# Patient Record
Sex: Female | Born: 1935 | Race: White | Hispanic: No | Marital: Married | Smoking: Former smoker
Health system: Southern US, Community
[De-identification: ages and names within clinical notes are randomized; demographics above are authoritative.]

## PROBLEM LIST (undated history)

## (undated) DIAGNOSIS — T4145XA Adverse effect of unspecified anesthetic, initial encounter: Secondary | ICD-10-CM

## (undated) DIAGNOSIS — I82409 Acute embolism and thrombosis of unspecified deep veins of unspecified lower extremity: Secondary | ICD-10-CM

## (undated) DIAGNOSIS — K5790 Diverticulosis of intestine, part unspecified, without perforation or abscess without bleeding: Secondary | ICD-10-CM

## (undated) DIAGNOSIS — I10 Essential (primary) hypertension: Secondary | ICD-10-CM

## (undated) DIAGNOSIS — C4491 Basal cell carcinoma of skin, unspecified: Secondary | ICD-10-CM

## (undated) DIAGNOSIS — Z8719 Personal history of other diseases of the digestive system: Secondary | ICD-10-CM

## (undated) DIAGNOSIS — Z9989 Dependence on other enabling machines and devices: Secondary | ICD-10-CM

## (undated) DIAGNOSIS — K219 Gastro-esophageal reflux disease without esophagitis: Secondary | ICD-10-CM

## (undated) DIAGNOSIS — M47812 Spondylosis without myelopathy or radiculopathy, cervical region: Secondary | ICD-10-CM

## (undated) DIAGNOSIS — F419 Anxiety disorder, unspecified: Secondary | ICD-10-CM

## (undated) DIAGNOSIS — G5792 Unspecified mononeuropathy of left lower limb: Secondary | ICD-10-CM

## (undated) DIAGNOSIS — IMO0002 Reserved for concepts with insufficient information to code with codable children: Secondary | ICD-10-CM

## (undated) DIAGNOSIS — G5700 Lesion of sciatic nerve, unspecified lower limb: Secondary | ICD-10-CM

## (undated) DIAGNOSIS — M5430 Sciatica, unspecified side: Secondary | ICD-10-CM

## (undated) DIAGNOSIS — G4733 Obstructive sleep apnea (adult) (pediatric): Secondary | ICD-10-CM

## (undated) DIAGNOSIS — M1990 Unspecified osteoarthritis, unspecified site: Secondary | ICD-10-CM

## (undated) DIAGNOSIS — T8859XA Other complications of anesthesia, initial encounter: Secondary | ICD-10-CM

## (undated) DIAGNOSIS — G4730 Sleep apnea, unspecified: Secondary | ICD-10-CM

## (undated) DIAGNOSIS — M810 Age-related osteoporosis without current pathological fracture: Secondary | ICD-10-CM

## (undated) DIAGNOSIS — IMO0001 Reserved for inherently not codable concepts without codable children: Secondary | ICD-10-CM

## (undated) DIAGNOSIS — M8580 Other specified disorders of bone density and structure, unspecified site: Secondary | ICD-10-CM

## (undated) HISTORY — PX: HERNIA REPAIR: SHX51

## (undated) HISTORY — PX: INTRAOPERATIVE ARTERIOGRAM: SHX5157

## (undated) HISTORY — DX: Lesion of sciatic nerve, unspecified lower limb: G57.00

## (undated) HISTORY — DX: Reserved for concepts with insufficient information to code with codable children: IMO0002

## (undated) HISTORY — DX: Reserved for inherently not codable concepts without codable children: IMO0001

## (undated) HISTORY — PX: ABDOMINAL HYSTERECTOMY: SHX81

## (undated) HISTORY — DX: Essential (primary) hypertension: I10

## (undated) HISTORY — DX: Basal cell carcinoma of skin, unspecified: C44.91

## (undated) HISTORY — PX: EYE SURGERY: SHX253

## (undated) HISTORY — PX: TUBAL LIGATION: SHX77

## (undated) HISTORY — PX: BREAST SURGERY: SHX581

## (undated) HISTORY — DX: Dependence on other enabling machines and devices: Z99.89

## (undated) HISTORY — PX: SPINAL FUSION: SHX223

## (undated) HISTORY — PX: TOTAL HIP ARTHROPLASTY: SHX124

## (undated) HISTORY — PX: OTHER SURGICAL HISTORY: SHX169

## (undated) HISTORY — DX: Other specified disorders of bone density and structure, unspecified site: M85.80

## (undated) HISTORY — DX: Gastro-esophageal reflux disease without esophagitis: K21.9

## (undated) HISTORY — DX: Diverticulosis of intestine, part unspecified, without perforation or abscess without bleeding: K57.90

## (undated) HISTORY — DX: Obstructive sleep apnea (adult) (pediatric): G47.33

## (undated) HISTORY — DX: Anxiety disorder, unspecified: F41.9

---

## 1991-11-08 HISTORY — PX: TOTAL VAGINAL HYSTERECTOMY: SHX2548

## 2000-04-12 ENCOUNTER — Other Ambulatory Visit: Admission: RE | Admit: 2000-04-12 | Discharge: 2000-04-12 | Payer: Self-pay | Admitting: Radiology

## 2000-04-12 ENCOUNTER — Encounter (INDEPENDENT_AMBULATORY_CARE_PROVIDER_SITE_OTHER): Payer: Self-pay

## 2000-11-14 ENCOUNTER — Encounter: Payer: Self-pay | Admitting: Internal Medicine

## 2001-01-25 ENCOUNTER — Ambulatory Visit (HOSPITAL_COMMUNITY): Admission: RE | Admit: 2001-01-25 | Discharge: 2001-01-25 | Payer: Self-pay | Admitting: Internal Medicine

## 2001-01-25 ENCOUNTER — Encounter: Payer: Self-pay | Admitting: Internal Medicine

## 2001-04-08 ENCOUNTER — Encounter: Payer: Self-pay | Admitting: Internal Medicine

## 2001-07-11 ENCOUNTER — Other Ambulatory Visit: Admission: RE | Admit: 2001-07-11 | Discharge: 2001-07-11 | Payer: Self-pay | Admitting: Internal Medicine

## 2001-07-11 ENCOUNTER — Encounter (INDEPENDENT_AMBULATORY_CARE_PROVIDER_SITE_OTHER): Payer: Self-pay | Admitting: Specialist

## 2001-08-30 ENCOUNTER — Other Ambulatory Visit: Admission: RE | Admit: 2001-08-30 | Discharge: 2001-08-30 | Payer: Self-pay | Admitting: Obstetrics and Gynecology

## 2002-09-02 ENCOUNTER — Other Ambulatory Visit: Admission: RE | Admit: 2002-09-02 | Discharge: 2002-09-02 | Payer: Self-pay | Admitting: Obstetrics and Gynecology

## 2002-09-09 ENCOUNTER — Encounter: Payer: Self-pay | Admitting: Internal Medicine

## 2004-03-05 ENCOUNTER — Encounter: Admission: RE | Admit: 2004-03-05 | Discharge: 2004-03-05 | Payer: Self-pay | Admitting: Orthopedic Surgery

## 2004-09-07 ENCOUNTER — Other Ambulatory Visit: Admission: RE | Admit: 2004-09-07 | Discharge: 2004-09-07 | Payer: Self-pay | Admitting: Obstetrics and Gynecology

## 2004-09-14 ENCOUNTER — Encounter: Admission: RE | Admit: 2004-09-14 | Discharge: 2004-09-14 | Payer: Self-pay | Admitting: Orthopedic Surgery

## 2004-09-27 ENCOUNTER — Ambulatory Visit: Payer: Self-pay | Admitting: Internal Medicine

## 2004-10-13 ENCOUNTER — Ambulatory Visit: Payer: Self-pay | Admitting: Internal Medicine

## 2005-08-08 ENCOUNTER — Encounter: Admission: RE | Admit: 2005-08-08 | Discharge: 2005-08-08 | Payer: Self-pay | Admitting: Cardiovascular Disease

## 2005-08-15 ENCOUNTER — Ambulatory Visit (HOSPITAL_COMMUNITY): Admission: RE | Admit: 2005-08-15 | Discharge: 2005-08-15 | Payer: Self-pay | Admitting: Cardiovascular Disease

## 2005-11-02 ENCOUNTER — Encounter: Admission: RE | Admit: 2005-11-02 | Discharge: 2005-11-02 | Payer: Self-pay | Admitting: Internal Medicine

## 2005-11-11 ENCOUNTER — Ambulatory Visit: Payer: Self-pay | Admitting: Internal Medicine

## 2005-11-25 ENCOUNTER — Ambulatory Visit: Payer: Self-pay | Admitting: Internal Medicine

## 2006-05-22 ENCOUNTER — Inpatient Hospital Stay (HOSPITAL_COMMUNITY): Admission: RE | Admit: 2006-05-22 | Discharge: 2006-05-25 | Payer: Self-pay | Admitting: Orthopedic Surgery

## 2006-09-19 ENCOUNTER — Other Ambulatory Visit: Admission: RE | Admit: 2006-09-19 | Discharge: 2006-09-19 | Payer: Self-pay | Admitting: Obstetrics and Gynecology

## 2007-10-30 ENCOUNTER — Encounter: Admission: RE | Admit: 2007-10-30 | Discharge: 2007-10-30 | Payer: Self-pay | Admitting: Internal Medicine

## 2008-07-09 ENCOUNTER — Encounter
Admission: RE | Admit: 2008-07-09 | Discharge: 2008-07-09 | Payer: Self-pay | Admitting: Physical Medicine and Rehabilitation

## 2008-08-06 ENCOUNTER — Encounter
Admission: RE | Admit: 2008-08-06 | Discharge: 2008-08-07 | Payer: Self-pay | Admitting: Physical Medicine & Rehabilitation

## 2008-08-07 ENCOUNTER — Ambulatory Visit: Payer: Self-pay | Admitting: Physical Medicine & Rehabilitation

## 2008-09-12 ENCOUNTER — Ambulatory Visit: Payer: Self-pay | Admitting: Physical Medicine & Rehabilitation

## 2008-09-19 ENCOUNTER — Ambulatory Visit: Payer: Self-pay | Admitting: Physical Medicine & Rehabilitation

## 2008-09-26 ENCOUNTER — Ambulatory Visit: Payer: Self-pay | Admitting: Physical Medicine & Rehabilitation

## 2008-10-06 ENCOUNTER — Encounter: Payer: Self-pay | Admitting: Obstetrics and Gynecology

## 2008-10-06 ENCOUNTER — Other Ambulatory Visit: Admission: RE | Admit: 2008-10-06 | Discharge: 2008-10-06 | Payer: Self-pay | Admitting: Obstetrics and Gynecology

## 2008-10-06 ENCOUNTER — Ambulatory Visit: Payer: Self-pay | Admitting: Gynecology

## 2008-10-10 ENCOUNTER — Ambulatory Visit: Payer: Self-pay | Admitting: Physical Medicine & Rehabilitation

## 2008-10-17 ENCOUNTER — Ambulatory Visit: Payer: Self-pay | Admitting: Physical Medicine & Rehabilitation

## 2008-10-24 ENCOUNTER — Ambulatory Visit: Payer: Self-pay | Admitting: Physical Medicine & Rehabilitation

## 2008-11-18 ENCOUNTER — Telehealth: Payer: Self-pay | Admitting: Internal Medicine

## 2008-11-24 DIAGNOSIS — K222 Esophageal obstruction: Secondary | ICD-10-CM

## 2008-11-24 DIAGNOSIS — K449 Diaphragmatic hernia without obstruction or gangrene: Secondary | ICD-10-CM | POA: Insufficient documentation

## 2008-11-24 DIAGNOSIS — K219 Gastro-esophageal reflux disease without esophagitis: Secondary | ICD-10-CM | POA: Insufficient documentation

## 2008-11-24 DIAGNOSIS — D126 Benign neoplasm of colon, unspecified: Secondary | ICD-10-CM

## 2008-11-24 DIAGNOSIS — K5730 Diverticulosis of large intestine without perforation or abscess without bleeding: Secondary | ICD-10-CM | POA: Insufficient documentation

## 2008-11-25 ENCOUNTER — Ambulatory Visit: Payer: Self-pay | Admitting: Internal Medicine

## 2008-11-25 DIAGNOSIS — R634 Abnormal weight loss: Secondary | ICD-10-CM | POA: Insufficient documentation

## 2008-11-25 DIAGNOSIS — K625 Hemorrhage of anus and rectum: Secondary | ICD-10-CM

## 2008-11-25 DIAGNOSIS — R197 Diarrhea, unspecified: Secondary | ICD-10-CM | POA: Insufficient documentation

## 2008-11-25 DIAGNOSIS — Z860100 Personal history of colon polyps, unspecified: Secondary | ICD-10-CM | POA: Insufficient documentation

## 2008-12-01 ENCOUNTER — Telehealth: Payer: Self-pay | Admitting: Internal Medicine

## 2008-12-01 ENCOUNTER — Ambulatory Visit: Payer: Self-pay | Admitting: Internal Medicine

## 2008-12-01 DIAGNOSIS — K6389 Other specified diseases of intestine: Secondary | ICD-10-CM

## 2008-12-02 ENCOUNTER — Ambulatory Visit (HOSPITAL_COMMUNITY): Admission: RE | Admit: 2008-12-02 | Discharge: 2008-12-02 | Payer: Self-pay | Admitting: Internal Medicine

## 2008-12-03 ENCOUNTER — Encounter: Payer: Self-pay | Admitting: Internal Medicine

## 2008-12-18 ENCOUNTER — Telehealth: Payer: Self-pay | Admitting: Internal Medicine

## 2009-01-07 ENCOUNTER — Telehealth: Payer: Self-pay | Admitting: Internal Medicine

## 2009-01-12 ENCOUNTER — Telehealth: Payer: Self-pay | Admitting: Internal Medicine

## 2009-05-06 ENCOUNTER — Encounter: Admission: RE | Admit: 2009-05-06 | Discharge: 2009-05-06 | Payer: Self-pay | Admitting: Orthopedic Surgery

## 2009-06-26 ENCOUNTER — Encounter
Admission: RE | Admit: 2009-06-26 | Discharge: 2009-07-20 | Payer: Self-pay | Admitting: Physical Medicine & Rehabilitation

## 2009-06-29 ENCOUNTER — Ambulatory Visit: Payer: Self-pay | Admitting: Physical Medicine & Rehabilitation

## 2009-07-09 ENCOUNTER — Ambulatory Visit: Payer: Self-pay | Admitting: Physical Medicine & Rehabilitation

## 2009-07-20 ENCOUNTER — Ambulatory Visit: Payer: Self-pay | Admitting: Physical Medicine & Rehabilitation

## 2009-07-29 ENCOUNTER — Encounter: Admission: RE | Admit: 2009-07-29 | Discharge: 2009-07-29 | Payer: Self-pay | Admitting: Orthopedic Surgery

## 2009-08-11 ENCOUNTER — Encounter
Admission: RE | Admit: 2009-08-11 | Discharge: 2009-10-19 | Payer: Self-pay | Admitting: Physical Medicine & Rehabilitation

## 2009-09-15 ENCOUNTER — Ambulatory Visit: Payer: Self-pay | Admitting: Physical Medicine & Rehabilitation

## 2009-09-15 ENCOUNTER — Ambulatory Visit: Payer: Self-pay | Admitting: Internal Medicine

## 2009-09-15 ENCOUNTER — Encounter
Admission: RE | Admit: 2009-09-15 | Discharge: 2009-09-17 | Payer: Self-pay | Admitting: Physical Medicine & Rehabilitation

## 2009-10-08 ENCOUNTER — Encounter: Admission: RE | Admit: 2009-10-08 | Discharge: 2009-10-08 | Payer: Self-pay | Admitting: Orthopedic Surgery

## 2009-10-08 ENCOUNTER — Ambulatory Visit: Payer: Self-pay | Admitting: Obstetrics and Gynecology

## 2009-10-20 ENCOUNTER — Ambulatory Visit: Payer: Self-pay | Admitting: Internal Medicine

## 2009-10-21 DIAGNOSIS — R1084 Generalized abdominal pain: Secondary | ICD-10-CM | POA: Insufficient documentation

## 2009-10-23 ENCOUNTER — Ambulatory Visit: Payer: Self-pay | Admitting: Internal Medicine

## 2009-10-23 LAB — CONVERTED CEMR LAB: Creatinine, Ser: 0.9 mg/dL (ref 0.4–1.2)

## 2009-10-26 ENCOUNTER — Telehealth: Payer: Self-pay | Admitting: Internal Medicine

## 2009-10-26 ENCOUNTER — Encounter: Admission: RE | Admit: 2009-10-26 | Discharge: 2009-10-26 | Payer: Self-pay | Admitting: Internal Medicine

## 2009-11-26 ENCOUNTER — Telehealth: Payer: Self-pay | Admitting: Internal Medicine

## 2010-02-05 ENCOUNTER — Telehealth: Payer: Self-pay | Admitting: Internal Medicine

## 2010-03-03 ENCOUNTER — Encounter: Payer: Self-pay | Admitting: Internal Medicine

## 2010-03-17 ENCOUNTER — Encounter
Admission: RE | Admit: 2010-03-17 | Discharge: 2010-03-19 | Payer: Self-pay | Admitting: Physical Medicine & Rehabilitation

## 2010-03-19 ENCOUNTER — Ambulatory Visit: Payer: Self-pay | Admitting: Physical Medicine & Rehabilitation

## 2010-04-08 ENCOUNTER — Encounter
Admission: RE | Admit: 2010-04-08 | Discharge: 2010-04-08 | Payer: Self-pay | Admitting: Physical Medicine and Rehabilitation

## 2010-10-13 ENCOUNTER — Ambulatory Visit: Payer: Self-pay | Admitting: Obstetrics and Gynecology

## 2010-10-13 ENCOUNTER — Other Ambulatory Visit
Admission: RE | Admit: 2010-10-13 | Discharge: 2010-10-13 | Payer: Self-pay | Source: Home / Self Care | Admitting: Obstetrics and Gynecology

## 2010-12-09 ENCOUNTER — Other Ambulatory Visit: Payer: Self-pay | Admitting: Anesthesiology

## 2010-12-09 ENCOUNTER — Ambulatory Visit
Admission: RE | Admit: 2010-12-09 | Discharge: 2010-12-09 | Disposition: A | Discharge status: Home / Self Care | Payer: Medicare Other | Source: Ambulatory Visit | Attending: Anesthesiology | Admitting: Anesthesiology

## 2010-12-09 DIAGNOSIS — R52 Pain, unspecified: Secondary | ICD-10-CM

## 2010-12-09 NOTE — Progress Notes (Signed)
Summary: Recall colon - CX  ---- Converted from flag ---- ---- 01/27/2010 9:50 AM, Hilarie Fredrickson MD wrote: She had colon / BE 11-2008. no need for follow up  ---- 01/26/2010 3:50 PM, Harlow Mares CMA (AAMA) wrote: Recall project  This patient has a recall colonoscopy in [facility] for 10/2009 you seen her in the office after the recal colonoscopy date and there is no mention of her having a colonoscopy. Do you still wish her to have a recall colonoscopy. She also never scheduled a follow up office visit from that time in 2010.   Hulan Saas ------------------------------

## 2010-12-09 NOTE — Letter (Signed)
Summary: [facility]  [facility]   Imported By: Maryln Gottron 03/16/2010 15:36:59  _____________________________________________________________________  External Attachment:    Type:   Image     Comment:   External Document

## 2010-12-09 NOTE — Progress Notes (Signed)
Summary: imaging report   Phone Note Call from Patient Call back at Home Phone [phone]   Caller: Patient Call For: Dr. Marina Goodell Reason for Call: Talk to Nurse Summary of Call: pt wanted Dr. Marina Goodell to know that [facility] will be sending him a report regarding her "siatic nerve being compromised by the paraformis muscle" Initial call taken by: Vallarie Mare,  November 26, 2009 3:59 PM  Follow-up for Phone Call        noted.   Follow-up by: Milford Cage NCMA,  December 03, 2009 11:04 AM

## 2010-12-24 ENCOUNTER — Ambulatory Visit (INDEPENDENT_AMBULATORY_CARE_PROVIDER_SITE_OTHER): Payer: Medicare Other | Admitting: Obstetrics and Gynecology

## 2010-12-24 DIAGNOSIS — N644 Mastodynia: Secondary | ICD-10-CM

## 2011-03-22 NOTE — Procedures (Signed)
NAME:  Stacy [facility], CIRILO NO.:  [id number]   MEDICAL RECORD NO.:  [id number]          PATIENT TYPE:  REC   LOCATION:  TPC                          FACILITY:  [facility]   PHYSICIAN:  Erick Colace, M.D.DATE OF BIRTH:  [birth year]   DATE OF PROCEDURE:  DATE OF DISCHARGE:                               OPERATIVE REPORT   Acupuncture treatment 5.   INDICATION:  Lumbar scoliosis and spondylosis with lumbar spinal  stenosis.  Pain has only partially responded to medication management.  On the conservative care, she is considering surgery.   She was placed on the left at BL 21, BL 23, BL 25, BL 27, left BL 31, BL  40, BL 37, and BL 60; 150 Hz stimulation alternating with 30 Hz  stimulation.  The patient tolerated the procedure well.      Erick Colace, M.D.  Electronically Signed     AEK/MEDQ  D:  10/17/2008 16:47:33  T:  10/18/2008 02:00:30  Job:  [id number]

## 2011-03-22 NOTE — Assessment & Plan Note (Signed)
TREATMENT NUMBER:  4.   INDICATIONS:  Lumbar scoliosis, spondylosis, lumbar spinal stenosis,  chronic back and lower extremity pain.  Pain is only partially  responsive to medications.  She is considering surgery.   Needle was placed bilateral on left BL21, BL23, BL25, BL27, as well as  left BL31, left BL37, left BL40, and left BL60 for her stimulation time  30 minutes.  The patient tolerated the procedure well.  Postinjection  instructions given.  See her back in 1 week.      Erick Colace, M.D.  Electronically Signed     AEK/MedQ  D:  10/10/2008 12:35:38  T:  10/11/2008 07:18:13  Job #:  [id number]

## 2011-03-22 NOTE — Assessment & Plan Note (Signed)
[name] returns today after a long absence from my practice.  She  was last seen by me on October 24, 2008.  She went through some  acupuncture, but then ultimately ended up having an L2 through S1 fusion  done at [facility] around February 2010.  She had a  postoperative complication of the pedicle screw area of fracture  superior to S1 screws bilaterally.  She has undergone successful  sacroplasty at [facility] about 3 weeks ago.  She has multiple questions in  regards to her pain management.   She has had no leg weakness.  Postoperatively, she has had some numbness  in the left lower extremity, but nothing that was not present  preoperatively.   Her current pain meds are:  1. Fentanyl patch 50 mcg q.72 h.  2. Oxycodone 5 mg q.4 h does not take it at night.  3. Lyrica 100 mg b.i.d.   PHYSICAL EXAMINATION:  GENERAL:  Her mood and affect are appropriate.  VITAL SIGNS:  Blood pressure 102/45, pulse 80, respirations 18, O2 sat  99% on room air.  Well-developed, well-nourished female.  Orientation  x3.  Affect is bright.  Gait is normal.  Pain score is 2-3/10 currently,  but with activity 5/6.  Relief from meds is good.   She has no tenderness to palpation in the lumbar or sacral area.  The  fusion incision is well-healed.  She has well-healed incisions from her  vertebroplasty.  No evidence of erythema and no evidence of drainage.  She has good strength in the lower extremities, mildly decreased  sensation in the left L5 dermatome.  She has mild ankle dorsiflexion  weakness on the right side.  Her favors maneuver causes pain on the  right side in the SI joint area, but not on the hip area.   I reviewed her x-rays.  She does have a left total hip replacement and  she has pedicle screw bilaterally at L2, L3, L4, and unilaterally at L5,  and bilateral S1.  This is a postoperative film, but not post  vertebroplasty.   IMPRESSION:  1. Lumbar post laminectomy  syndrome.  2. Right sacroiliac pain.  I believed that now that her sacroplasty      has cemented the fractures in the S1 biomechanical forces are      transmitted toward the right SI joint.   PLAN:  We will do a right sacroiliac injection and then start therapy  after that.  We will do this in about 2-3 weeks.  We discussed  medication that overall plan to wean over time, but no changes currently  other than the change in the Lyrica to 100 mg nightly.  The patient is  in agreement with this plan.  She plans to receive her medications from  Dr. Jacky Kindle.      Erick Colace, M.D.  Electronically Signed     AEK/MedQ  D:  06/29/2009 15:16:44  T:  06/30/2009 06:21:17  Job #:  [id number]   cc:   Geoffry Paradise, M.D.  Fax: [phone]

## 2011-03-22 NOTE — Procedures (Signed)
NAME:  SHIGEKO, MANARD           ACCOUNT NO.:  [id number]   MEDICAL RECORD NO.:  [id number]           PATIENT TYPE:   LOCATION:                                 FACILITY:   PHYSICIAN:  Erick Colace, M.D.DATE OF BIRTH:  09/21/1934   DATE OF PROCEDURE:  DATE OF DISCHARGE:                               OPERATIVE REPORT   Needles placed bilaterally, BL21 and BL27 as well as left BL23, left  BL25, left BL31, left BL40, left BL37, left BL60.  Two hertz stimulation  in the upper 3 needle pairs and 2 Hz alternating with a 10 Hz on the  lower needles.  The patient tolerated the procedure well.  Treatment  time 30 minutes.  We will see how she does with this last treatment.  If  she is getting some pain relief, we will continue, if not we will  discontinue.      Erick Colace, M.D.  Electronically Signed     AEK/MEDQ  D:  10/24/2008 11:54:17  T:  10/25/2008 01:36:10  Job:  [id number]

## 2011-03-22 NOTE — Group Therapy Note (Signed)
CONSULT REQUESTED BY:  Caralyn Guile. Ramos, MD   The patient with history of back pain, evaluate potential use of  acupuncture.   HISTORY OF PRESENT ILLNESS:  [name] is a 77 year old female with a  history of lumbar stenosis, spondylosis, and thoracolumbar scoliosis  acquired.  She has a long history of back pain and left lower extremity  pain.  Her lower extremity pain is actually the bigger issue on the left  side.  Majority for her pain is back and below the posterior aspect of  the knee into the left calf and occasionally into the foot.  Over the  last 4 years, she has had 12 epidural injections, which have always been  on the left side transforaminal route, L4, L5, and S1.  This initially  was quite effective for pain relief, but the last two really have had  fairly minimal effect.  The patient is already doing aquatic therapy 3  times a week.  She has had tried chiropractice in the recent past.  She  has had physical therapy in the past and in fact the patient's daughter  is a physical therapist in [place].  She indicates that her average  pain is 5/10 and described as burning, tingling, and aching and  interferes activity at an 8/10 level.  Pain is worse during daytime,  evening, and nighttime hours and relatively better during the morning.  Pain improves with rest, but increases with walking and standing.  She  can walk without assistance.  She can walk for an hour straight, climbs  steps, and drive.  She is independent with all her self-care.   SOCIAL HISTORY:  She is retired, lives with her spouse, very little  alcohol use, and no smoking.   REVIEW OF SYSTEMS:  Tingling in the left leg as well as reflux.   PAST MEDICAL HISTORY:  Hypertension and arthritis.   FAMILY HISTORY:  Heart disease and high blood pressure.   PAST SURGICAL HISTORY:  Hysterectomy in 1990 and left total hip  replacement in 2008.   Oswestry disability score 26%.   PHYSICAL EXAMINATION:   GENERAL:  No acute distress.  Mood and affect  appropriate.  Orientation x3.  Gait is normal.  She is able to toe walk  and heel walk, although heel walking is painful for her.  EXTREMITIES:  No evidence of edema.  She has normal coordination in the  upper and lower extremities.  She has normal deep tendon reflexes in the  bilateral upper and lower extremities.  Normal sensation in the  bilateral upper and lower extremities to pinprick, although to light  touch, it is mildly diminished in the left L4 dermatome.  She has no  evidence of intrinsic atrophy.   Motor strength is 5/5 in the bilateral deltoid, biceps, grip as well as  hip flexion, knee extension, and ankle dorsiflexion.  Her left hip  internal and external rotation is mildly diminished.  Right hip range of  motion normal.  Knee and ankle range of motion normal.  Shoulder, elbow,  and wrist range of motion normal.   Spine does show a levoconvex lumbar scoliosis with history of  dextroconvex thoracic scoliosis.   She has mild tenderness to palpation in the lumbosacral junction.  Otherwise, no significant tenderness in the lumbar spine.  No pain along  the spinous processes.   IMPRESSION:  Lumbar stenosis, spondylosis, and thoracolumbar scoliosis.  We discussed her treatment options, and acupuncture fits into her total  treatment  plan.  As I discussed with the patient, acupuncture would take  at least weekly visits times 4-6 to establish action.  We discussed the  mechanism of action as well as potential complications.   She would like to set up some additional treatments, would do this out  at [facility] given insurance factors.   Thank you for this interesting consultation.  We will keep you updated  on her progress.      Erick Colace, M.D.  Electronically Signed     AEK/MedQ  D:  08/07/2008 14:34:10  T:  08/08/2008 02:53:44  Job #:  [id number]   cc:   Caralyn Guile. Ethelene Hal, M.D.  Fax: [phone]   Geoffry Paradise, M.D.  Fax: [phone]

## 2011-03-22 NOTE — Procedures (Signed)
NAME:  Stacy [facility], Stacy [facility] NO.:  [id number]   MEDICAL RECORD NO.:  [id number]         PATIENT TYPE:  APRM   LOCATION:                                 FACILITY:   PHYSICIAN:  Erick Colace, M.D.DATE OF BIRTH:  12-21-1933   DATE OF PROCEDURE:  DATE OF DISCHARGE:                               OPERATIVE REPORT   PROCEDURE:  Acupuncture treatment #2, treatment today.   INDICATION:  Lumbar spinal stenosis with chronic low back pain.   DESCRIPTION OF PROCEDURE:  Needles placed bilateral BL 21 and BL 29 as  well as left BL 23, BL 25, BL 27, and left BL 36, BL 37, BL 40, and BL  60.  E-stim 2 Hz x 30 minutes.  The patient tolerated the procedure  well.  Return in 1 week.      Erick Colace, M.D.  Electronically Signed     AEK/MEDQ  D:  09/19/2008 12:05:17  T:  09/20/2008 01:18:29  Job:  [id number]

## 2011-03-22 NOTE — Procedures (Signed)
NAME:  [facility], Stacy [facility]           ACCOUNT NO.:  [id number]   MEDICAL RECORD NO.:  [id number]          PATIENT TYPE:  REC   LOCATION:  TPC                          FACILITY:  [facility]   PHYSICIAN:  Erick Colace, M.D.DATE OF BIRTH:  12/08/1933   DATE OF PROCEDURE:  DATE OF DISCHARGE:                               OPERATIVE REPORT   ACUPUNCTURE TREATMENT:  Treatment today consisted of BL 21, BL 23  bilaterally as well as BL 27 bilaterally as well as left BL 25.  In  addition, left BL 54, left BL 37, left BL 40, and left BL 60.  E-Stim 2  Hz x30 minutes.  The patient tolerated the procedure well.  Every week  treatments x5 more.      Erick Colace, M.D.  Electronically Signed     AEK/MEDQ  D:  09/12/2008 12:30:02  T:  09/13/2008 00:55:46  Job:  [id number]

## 2011-03-22 NOTE — Assessment & Plan Note (Signed)
Stacy [facility] is a 77 year old female with a history of lumbosacral  spondylosis and scoliosis as well as spinal stenosis, has undergone  multilevel fusion L1-2, L2-3, L3-4, L4-5, L5-S1.  She was seen by me on  August 23, diagnosed with an SI joint problem as well.  She is scheduled  for SI joint injection, however is concerned about how her sacroplasty  at [facility] might interfere with this.  I reviewed the films from [facility]  sacroplasties around the pedicle screws at S1 and no were near the SI  joints.   CURRENT PAIN MEDICATIONS:  1. Fentanyl patch 50 mcg q.72 h.  2. Oxycodone 5 mg q.4 h.   EXAMINATION:  General, no acute stress.  Mood and affect appropriate.  Her fusion incision is well healed.   She has good strength in lower extremities.  Gait is normal.   PLAN:  We will proceed on with sacroiliac injection.  I do not think  this will have any bearing on her sacroplasty.  We will do this on the  13th.  Discussed more details of the injection with the patient.  She  understands.      Erick Colace, M.D.  Electronically Signed     AEK/MedQ  D:  07/09/2009 14:54:26  T:  07/10/2009 04:35:05  Job #:  [id number]

## 2011-03-22 NOTE — Procedures (Signed)
NAME:  Stacy [facility], Stacy [facility]           ACCOUNT NO.:  [id number]   MEDICAL RECORD NO.:  [id number]           PATIENT TYPE:   LOCATION:                                 FACILITY:   PHYSICIAN:  Andrew E. Kirsteins, M.D.DATE OF BIRTH:  03/18/1934   DATE OF PROCEDURE:  DATE OF DISCHARGE:                               OPERATIVE REPORT   Needles placed bilaterally, BL21 and BL27 as well as left BL23, left  BL25, left BL31, left BL40, left BL37, left BL60.  Two hertz stimulation  in the upper 3 needle pairs and 2 Hz alternating with a 10 Hz on the  lower needles.  The patient tolerated the procedure well.  Treatment  time 30 minutes.  We will see how she does with this last treatment.  If  she is getting some pain relief, we will continue, if not we will  discontinue.      Andrew E. Kirsteins, M.D.  Electronically Signed     AEK/MEDQ  D:  10/24/2008 11:54:17  T:  10/25/2008 01:36:10  Job:  [id number] 

## 2011-03-22 NOTE — Procedures (Signed)
NAME:  Stacy [facility], Stacy [facility] NO.:  [id number]   MEDICAL RECORD NO.:  [id number]          PATIENT TYPE:  REC   LOCATION:  TPC                          FACILITY:  [facility]   PHYSICIAN:  Erick Colace, M.D.DATE OF BIRTH:  1933/11/15   DATE OF PROCEDURE:  DATE OF DISCHARGE:                               OPERATIVE REPORT   Acupuncture treatment.   INDICATION:  Lumbar scoliosis and spondylosis, as well as lumbar spinal  stenosis with chronic back and lower extremity pain.  The lower  extremity pain is primarily down the left lower extremity down to the  heel.   Needles placed bilaterally at BL21, BL27, as well as left BL23, left  BL25, left BL31, left BL40, left BL37, and left BL60, 2 Hz stimulation  x30 minutes.  The patient tolerated the procedure well.  Postprocedure  instructions given.      Erick Colace, M.D.  Electronically Signed     AEK/MEDQ  D:  09/26/2008 13:23:30  T:  09/27/2008 02:03:42  Job:  [id number]

## 2011-03-25 NOTE — Cardiovascular Report (Signed)
NAME:  Stacy [facility], Stacy Parker NO.:  [id number]   MEDICAL RECORD NO.:  [id number]          PATIENT TYPE:  AMB   LOCATION:  SDS                          FACILITY:  [facility]   PHYSICIAN:  Nanetta Batty, M.D.   DATE OF BIRTH:  02-19-1934   DATE OF PROCEDURE:  08/15/2005  DATE OF DISCHARGE:  08/15/2005                              CARDIAC CATHETERIZATION   CLINICAL HISTORY:  Ms. Sheard is a 77 year old female, history of  hypertension and renal artery stenosis by MR angiography in 2003.  She is on  multiple medications.  She presents now for diagnostic aortography to rule  out renal artery stenosis.   PROCEDURE:  Patient was brought to the sixth floor [place] [facility] in the postabsorptive state.  She was  premedicated with p.o. Valium.  Right groin was prepped and shaved in the  usual sterile fashion.  1% Xylocaine was used for local anesthesia.  A 5-  French sheath was inserted into the right femoral artery using standard  Seldinger technique.  5-French tennis racquet catheter was used for mid  stream abdominal aortography.  Visipaque dye was used for the entirety of  the case.  Retrograde aortic pressures was monitored during the case.   ANGIOGRAPHIC RESULTS:  1.  Renal arteries normal.  2.  Infrarenal abdominal aortoiliac bifurcation normal.   IMPRESSION:  Ms. Mcmartin has essentially normal renal arteries and a normal  aortoiliac bifurcation.  She has essential hypertension.  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  She will be discharged home as an  outpatient and will see me back in the office in one week.   The sheath was removed and pressure was held on the groin to achieve  hemostasis.  Patient left the laboratory in stable condition.      Nanetta Batty, M.D.  Electronically Signed     JB/MEDQ  D:  11/01/2005  T:  11/02/2005  Job:  [id number]   cc:   [facility]   [facility]   Geoffry Paradise, M.D.  Fax: [phone]

## 2011-03-25 NOTE — H&P (Signed)
NAME:  Stacy [facility], Stacy Parker NO.:  [id number]   MEDICAL RECORD NO.:  [id number]          PATIENT TYPE:  INP   LOCATION:  NA                           [facility]ACILITY[facility]  [facility]   PHYSICIAN:  Ollen Gross, M.D.    DATE OF BIRTH:  [birth year]   DATE OF ADMISSION:  05/22/2006  DATE OF DISCHARGE:                                HISTORY & PHYSICAL   HISTORY OF PRESENT ILLNESS:  This 77 year old female has been seen by Dr.  Lequita Halt for ongoing left hip arthritis.  She has seen Dr. Charlann Boxer in the past  but was referred over for consideration of surgery.  She was referred over  by a friend of their family.  She has been seen by Dr. Lequita Halt.  X-rays have  been reviewed and the left hip shows severe end-stage arthritis with bone on  bone changes throughout with loss of about one-half inch in height and  length.  It is felt due to the significant findings and ongoing pain, she  would benefit from undergoing hip replacement.  Risks and benefits were  discussed.  The patient was admitted to the hospital.   ALLERGIES:  NO KNOWN DRUG ALLERGIES.   CURRENT MEDICATIONS:  Norvasc 5 mg daily, Finasteride 2.5 mg daily  (Proscar), Prevacid 30 mg daily, Micardis 80/12.5 daily, quinine sulfate 324  mg daily, Mobic 75 mg daily, Tylenol Extended Release 650 between 1 and 3  times a day. She is also on multiple vitamins, including calcium plus D,  Biotin, calcium, magnesium, zinc combo, extra-strength Tums, vitamin E,  vitamin C, aspirin 81 mg, multivitamin, flax seed oil.  All her vitamins are  taken once a day.   PAST MEDICAL HISTORY:  Hypertension, hiatal hernia, reflux disease,  scoliosis, history of sciatica treated with ESIs in the past secondary to  scoliosis and post-menopausal.   PAST SURGICAL HISTORY:  Laparoscopic procedure, hysterectomy, tubal  ligation.   SOCIAL HISTORY:  Married, nonsmoker, very little intake of alcohol.  Two  natural born children, one adopted.  Husband will be assisting  with care  after surgery.   FAMILY HISTORY:  Father with history of skin cancer and heart disease.  Mother with history of congestive obstructive pulmonary disease.  Maternal  grandmother with breast cancer.   REVIEW OF SYSTEMS:  GENERAL:  No fevers, chills, night sweats.  Neuro:  No  seizures, syncope, paralysis.  RESPIRATORY:  No shortness of breath,  productive cough, no hemoptysis.  CARDIOVASCULAR:  No chest pain.  No  orthopnea.  GASTROINTESTINAL:  No nausea, vomiting, diarrhea or  constipation.  GU:  No dysuria or hematuria.  MUSCULOSKELETAL:  Left hip.   PHYSICAL EXAMINATION:  VITAL SIGNS:  Pulse 76, respiration 12, blood  pressure 142/78.  GENERAL:  77 year old female, well-nourished, well-developed, in no acute  distress.  She is alert and oriented, cooperative, appears to be an  excellent historian.  HEENT:  Normocephalic, atraumatic.  Pupils are equal, round and reactive.  Oropharynx clear.  EOMs intact.  NECK:  Supple.  No bruits are appreciated.  CHEST:  Clear anterior posterior chest walls, no rhonchi, rales or wheezing.  HEART:  Regular rhythm, no murmur.  S1, S2 noted.  ABDOMEN:  Soft, nontender, bowel sounds present.  RECTAL/BREAST/INTEGUMENT:  Not done.  Not pertinent to present illness.Marland Kitchen  EXTREMITIES:  Left hip - left hip shows flexion of only 90 degrees.  There  is about a 10 degree external rotation contracture and she does externally  rotate to 10 degrees.  No internal rotation.  No adduction and 10 degrees of  abduction.   IMPRESSION:  1. Osteoarthritis left hip.  2. Hypertension.  3. Hiatal hernia.  4. Reflux disease.  5. Post-menopausal.  6. Scoliosis.  7. History of sciatica triggered with the size secondary to scoliosis.   PLAN:  The patient will be admitted to [facility] to undergo a  left total hip arthroplasty.  Surgery will be performed by Dr. Ollen Gross.  Her medical physician is Dr. Geoffry Paradise who will be consulted  if  needed for medical assistance during the hospital course.      Alexzandrew L. Julien Girt, P.A.      Ollen Gross, M.D.  Electronically Signed    ALP/MEDQ  D:  05/21/2006  T:  05/22/2006  Job:  [id number]   cc:   Ollen Gross, M.D.  Fax: [phone]   Geoffry Paradise, M.D.  Fax: [phone]

## 2011-03-25 NOTE — Op Note (Signed)
NAME:  Stacy Parker, Stacy Parker NO.:  [id number]   MEDICAL RECORD NO.:  [id number]          PATIENT TYPE:  INP   LOCATION:  0005                         FACILITY:  [facility]   PHYSICIAN:  Ollen Gross, M.D.    DATE OF BIRTH:  12/14/1933   DATE OF PROCEDURE:  05/22/2006  DATE OF DISCHARGE:                                 OPERATIVE REPORT   PREOPERATIVE DIAGNOSIS:  Osteoarthritis left hip.   POSTOPERATIVE DIAGNOSIS:  Osteoarthritis left hip.   PROCEDURE:  Left total hip arthroplasty.   SURGEON:  Ollen Gross, M.D.   ASSISTANT:  Avel Peace.   ANESTHESIA:  General.   ESTIMATED BLOOD LOSS:  450.   DRAINS:  Hemovac x1.   COMPLICATIONS:  None.   CONDITION:  Stable to recovery.   BRIEF HISTORY AND PHYSICAL:  Stacy Parker is a 77 year old female with end-  stage osteoarthritis of the left hip with intractable pain. She presents now  for left total hip arthroplasty.   PROCEDURE IN DETAIL:  After successful administration of  general  anesthetic, the patient was placed in a right lateral decubitus position  with the left side up and held with a hip positioner.  Left lower extremity  was isolated from her perineum with plastic drapes and prepped and draped in  the usual sterile fashion.  Short posterolateral incision was made with a 10  blade through the subcutaneous tissue to the level of the fascia lata which  was incised in line with the skin incision.  Sciatic nerve was palpated and  protected and short rotators isolated off the femur.  Capsulectomy was  performed and the hip dislocated.  Center of femoral head was marked and a  trial prosthesis placed just at the center of the trial  head corresponding  to the center of her native femoral  head.  Osteotomy  lines marked on the  femoral neck and osteotomy made with an oscillating saw.  The femoral head  was removed and the hip retracted anteriorly to gain acetabular exposure.   Acetabular ligamentous osteophytes  were removed.  Acetabular reaming starts  at 45 mm, coursing in increments of 2 up to 49 mm and then a 50-mm Pinnacle  acetabular shell  is placed in anatomic position and transfixed with two  dome screws.  The 36-mm Ultimate metal liner was then placed.   On the femoral side we utilized the canal finder to gain access to the  femoral canal and then thoroughly irrigated it.  Axial reaming was performed  up to 13.5 mm, proximal reaming to 18B and the sleeve machined to a small.  The 18B small trial sleeve was placed at the 18x13 stem with a 36+8 neck.  We matched her native anteversion with the neck.  Trial 36-mm head was  placed.  The hip was reduced with outstanding stability, full extension,  full external rotation, 70 degrees of flexion, 40 degrees of adduction, 90  degrees internal rotation and 90 degrees of flexion and 70 degrees of  internal rotation.  I placed the  left leg on top of the right and I felt  the  leg lengths were equal.  Hip was then dislocated and all trials were  removed.  We then placed the permanent 18B small sleeve with an 18x13 stem x  36+8 matching her native anteversion; 36+0 head was placed.  The hip was  reduced with the same stability parameters.  Wound was copiously irrigated  saline solution and the short rotators reattached to femur through drill  holes.  Fascia lata was closed over Hemovac drain with interrupted #1  Vicryl, subcu closed with #1 and 2-0 Vicryl and subcuticular running 4-0  Monocryl.  The drain was hooked to suction, incision clean and dry and Steri-  Strips and a bulky sterile dressing applied.  She was placed into a knee  immobilizer, awakened and transported to the recovery room in stable  condition.      Ollen Gross, M.D.  Electronically Signed     FA/MEDQ  D:  05/22/2006  T:  05/22/2006  Job:  [id number]

## 2011-03-25 NOTE — Procedures (Signed)
NAME:  Stacy Parker, Stacy Parker NO.:  [id number]   MEDICAL RECORD NO.:  [id number]          PATIENT TYPE:  REC   LOCATION:  TPC                          FACILITY:  [facility]   PHYSICIAN:  Erick Colace, M.D.DATE OF BIRTH:  May 29, 1934   DATE OF PROCEDURE:  07/20/2009  DATE OF DISCHARGE:                               OPERATIVE REPORT   PROCEDURE:  Right sacroiliac injection under fluoroscopic guidance.   INDICATIONS:  Postlaminectomy syndrome, sacroiliac discomfort after  fusion only partially relieved by medication management and other  conservative care.   Informed consent was obtained after describing risks and benefits of the  procedure with the patient.  These include bleeding, bruising,  infection.  She elects to proceed and has given written consent.  The  patient placed prone on fluoroscopy table.  Betadine prep, sterile  drape, 25-gauge inch and half needle was used to anesthetize the skin  and subcutaneous tissue 1% lidocaine x2 mL.  Then, a 25-gauge, 3-inch  spinal needle was inserted under fluoroscopic guidance, targeting the  right SI joint, AP, lateral, oblique imaging indicated no intravascular  uptake followed by injection of 1 mL of 2% MPF lidocaine and 1-1/2 mL of  40 mg/mL Kenalog.  The patient tolerated the procedure well.  Pre and  post injection vitals stable.  Post injection instructions given.      Erick Colace, M.D.  Electronically Signed     AEK/MEDQ  D:  07/20/2009 09:56:37  T:  07/21/2009 01:58:51  Job:  [id number]

## 2011-03-25 NOTE — Discharge Summary (Signed)
NAME:  MALLOREY, ODONELL NO.:  [id number]   MEDICAL RECORD NO.:  [id number]          PATIENT TYPE:  INP   LOCATION:  1519                         FACILITY:  [facility]   PHYSICIAN:  Ollen Gross, M.D.    DATE OF BIRTH:  14-Mar-1934   DATE OF ADMISSION:  05/22/2006  DATE OF DISCHARGE:  05/25/2006                                 DISCHARGE SUMMARY   ADMISSION DIAGNOSES:  1. Osteoarthritis, left hip.  2. Hypertension.  3. Hiatal hernia.  4. Reflux disease.  5. Postmenopausal.  6. Scoliosis.  7. History of sciatica felt to be secondary to scoliosis.   DISCHARGE DIAGNOSES:  1. Osteoarthritis, left hip, status post left total hip arthroplasty.  2. Hypertension.  3. Hiatal hernia.  4. Reflux disease.  5. Postmenopausal.  6. Scoliosis.  7. History of sciatica felt to be secondary to scoliosis.   PROCEDURE:  On May 22, 2006, left total hip surgery by Dr. Lequita Halt,  assistant Alexzandrew L. Perkins, P.A.-C.  Anesthesia general.   CONSULTATIONS:  None.   HISTORY OF PRESENT ILLNESS:  Stacy Parker is a 77 year old female with end-  stage arthritis of the left hip with intractable pain who now presents for  total hip arthroplasty.   LABORATORY DATA AND X-RAY FINDINGS:  Preop CBC with hemoglobin 14.4,  hematocrit 42.2.  Postop hemoglobin 12.1, drifted down last noted at 11.1  with a hematocrit of 32.6.  PT/PTT preop 13.1 and 30 respectively, INR 1.0.  Serial pro times followed with last known PT/INR 20.9 and 1.7.  Chem panel  on admission all within normal limits.  Serial BMETs were followed.  Sodium  did drop from 140 to 135, last noted at 134.  Remaining electrolytes  remained within normal limits.  Preop UA with large leukocyte esterase, few  epithelial cells, 3-6 wbc's, otherwise negative.  Followup UA with trace  hemoglobin, 3-6 wbc's, 0-2 rbc's and few bacteria with small leukocyte  esterase.  Blood group type O negative.   EKG on May 16, 2006, normal sinus rhythm  with normal EKG, no previous  compared by Dr. Lady Deutscher.  Hip films showed status post left hip  replacement without evidence or immediate complications.   HOSPITAL COURSE:  Admitted to [facility] and tolerated procedure  well and later returned to the recovery room and then orthopedic floor.  Started on p.o. analgesics for pain control following surgery.  Bed rest at  night and got up out of bed the next morning.  Encouraged p.o. pain  medications.  Weaned off the PCA.  Started on Prevacid for the reflux.  She  was also actually doing pretty well on the morning of day #1, did get up out  of bed.  By day #2, she was doing a little bit better and had no complaints.  She had gotten up to a chair.  Dressing was changed and incision looked  good.  Hemoglobin was stable at 11.1 with no complaints.  She progressed  with physical therapy.  Did get up and ambulate approximately 50 feet.  Progressed well and was doing so well was ready to go  home by the following  day of May 25, 2006.  She was seen on rounds and discharged home.   DISPOSITION:  The patient discharged home on July 26, 2006.   DISCHARGE MEDICATIONS:  Coumadin, Robaxin, Vicodin.   DIET:  Resume previous home diet.   ACTIVITY:  Partial weightbearing at 25% to 50%.  Home health PT and home  health nursing.  Total protocol with hip precautions.   FOLLOW UP:  Follow up in 2 weeks.   CONDITION ON DISCHARGE:  Improved.      Alexzandrew L. Julien Girt, P.A.      Ollen Gross, M.D.  Electronically Signed    ALP/MEDQ  D:  07/11/2006  T:  07/11/2006  Job:  [id number]   cc:   Geoffry Paradise, M.D.  Fax: [phone]

## 2011-05-04 ENCOUNTER — Other Ambulatory Visit: Payer: Self-pay | Admitting: Family Medicine

## 2011-05-04 DIAGNOSIS — E042 Nontoxic multinodular goiter: Secondary | ICD-10-CM

## 2011-05-09 ENCOUNTER — Ambulatory Visit
Admission: RE | Admit: 2011-05-09 | Discharge: 2011-05-09 | Disposition: A | Discharge status: Home / Self Care | Payer: Medicare Other | Source: Ambulatory Visit | Attending: Family Medicine | Admitting: Family Medicine

## 2011-05-09 DIAGNOSIS — E042 Nontoxic multinodular goiter: Secondary | ICD-10-CM

## 2011-06-23 ENCOUNTER — Other Ambulatory Visit: Payer: Self-pay | Admitting: Dermatology

## 2011-08-05 ENCOUNTER — Other Ambulatory Visit: Payer: Self-pay | Admitting: *Deleted

## 2011-08-05 MED ORDER — TEMAZEPAM 30 MG PO CAPS: 30.0000 mg | ORAL_CAPSULE | Freq: Every evening | ORAL | Status: DC | PRN

## 2011-08-05 NOTE — Telephone Encounter (Signed)
D G patient, request from pharmacy, last fill date 05/10/11.

## 2011-08-08 ENCOUNTER — Other Ambulatory Visit: Payer: Self-pay | Admitting: *Deleted

## 2011-08-08 DIAGNOSIS — G4700 Insomnia, unspecified: Secondary | ICD-10-CM

## 2011-08-08 MED ORDER — TEMAZEPAM 30 MG PO CAPS: 30.0000 mg | ORAL_CAPSULE | Freq: Every evening | ORAL | Status: DC | PRN

## 2011-08-08 NOTE — Telephone Encounter (Signed)
rf request from Pharmacy last fill date 05-10-11. Thanks

## 2011-09-22 ENCOUNTER — Other Ambulatory Visit: Payer: Self-pay | Admitting: Dermatology

## 2011-10-12 ENCOUNTER — Encounter: Payer: Self-pay | Admitting: Gynecology

## 2011-10-12 DIAGNOSIS — N950 Postmenopausal bleeding: Secondary | ICD-10-CM | POA: Insufficient documentation

## 2011-10-12 DIAGNOSIS — M8580 Other specified disorders of bone density and structure, unspecified site: Secondary | ICD-10-CM | POA: Insufficient documentation

## 2011-10-27 ENCOUNTER — Encounter: Payer: Self-pay | Admitting: Obstetrics and Gynecology

## 2011-10-27 ENCOUNTER — Ambulatory Visit (INDEPENDENT_AMBULATORY_CARE_PROVIDER_SITE_OTHER): Payer: Medicare Other | Admitting: Obstetrics and Gynecology

## 2011-10-27 VITALS — BP 120/76 | Ht 61.5 in | Wt 133.0 lb

## 2011-10-27 DIAGNOSIS — R351 Nocturia: Secondary | ICD-10-CM

## 2011-10-27 DIAGNOSIS — Z780 Asymptomatic menopausal state: Secondary | ICD-10-CM

## 2011-10-27 DIAGNOSIS — N951 Menopausal and female climacteric states: Secondary | ICD-10-CM

## 2011-10-27 DIAGNOSIS — M810 Age-related osteoporosis without current pathological fracture: Secondary | ICD-10-CM

## 2011-10-27 DIAGNOSIS — N952 Postmenopausal atrophic vaginitis: Secondary | ICD-10-CM

## 2011-10-27 NOTE — Progress Notes (Signed)
Patient came back to see me today for further followup. She had had osteopenia that we have been watching. It has progressed to osteoporosis.she is now seen Shirlee Limerick at [place]. She has tried 3 oral drugs with enough side effects that she had to stop them. She is having a followup bone density in February and then will decide upon treatment with Dr. Valarie Cones. She is having no vaginal bleeding. She is having no pelvic pain. She is up-to-date on mammograms. Her menopausal symptoms continues to respond to Neurontin which she started for her back. She does have urinary frequency with nocturia but without urgency.she has no dysuria or hematuria. She does have vaginal dryness but does not require intervention.  ROS: See above for pertinent positives. In addition 12 system review reveals adenomatous colon polyps, GERD, esophageal stricture,and hiatal hernia.  Physical examination:  Kennon Portela present. HEENT within normal limits. Neck: Thyroid not large. No masses. Supraclavicular nodes: not enlarged. Breasts: Examined in both sitting midline position. No skin changes and no masses. Abdomen: Soft no guarding rebound or masses or hernia. Pelvic: External: Within normal limits. BUS: Within normal limits. Vaginal:within normal limits. Good estrogen effect. No evidence of cystocele rectocele or enterocele. Cervix: clean. Uterus: Normal size and shape. Adnexa: No masses. Rectovaginal exam: Confirmatory and negative. Extremities: Within normal limits.   Assessment: #1. Osteoporosis #2. Atrophic vaginitis #3. Nocturia #4. Menopausal symptoms  Plan: Continue osteoporosis treatment as per Dr. Valarie Cones. Continue Neurontin. Offered her medicine for nocturia. She declined. Continue yearly mammograms. We discussed that in the absence of symptoms she only needs yearly breast exams.

## 2011-12-06 DIAGNOSIS — I10 Essential (primary) hypertension: Secondary | ICD-10-CM | POA: Diagnosis not present

## 2011-12-06 DIAGNOSIS — G894 Chronic pain syndrome: Secondary | ICD-10-CM | POA: Diagnosis not present

## 2011-12-06 DIAGNOSIS — Z79899 Other long term (current) drug therapy: Secondary | ICD-10-CM | POA: Diagnosis not present

## 2011-12-06 DIAGNOSIS — M47817 Spondylosis without myelopathy or radiculopathy, lumbosacral region: Secondary | ICD-10-CM | POA: Diagnosis not present

## 2011-12-06 DIAGNOSIS — M4120 Other idiopathic scoliosis, site unspecified: Secondary | ICD-10-CM | POA: Diagnosis not present

## 2011-12-06 DIAGNOSIS — R82998 Other abnormal findings in urine: Secondary | ICD-10-CM | POA: Diagnosis not present

## 2011-12-06 DIAGNOSIS — M810 Age-related osteoporosis without current pathological fracture: Secondary | ICD-10-CM | POA: Diagnosis not present

## 2011-12-06 DIAGNOSIS — IMO0002 Reserved for concepts with insufficient information to code with codable children: Secondary | ICD-10-CM | POA: Diagnosis not present

## 2011-12-08 DIAGNOSIS — M25559 Pain in unspecified hip: Secondary | ICD-10-CM | POA: Diagnosis not present

## 2011-12-12 DIAGNOSIS — Z1212 Encounter for screening for malignant neoplasm of rectum: Secondary | ICD-10-CM | POA: Diagnosis not present

## 2011-12-13 DIAGNOSIS — M810 Age-related osteoporosis without current pathological fracture: Secondary | ICD-10-CM | POA: Diagnosis not present

## 2011-12-13 DIAGNOSIS — Z0000 Encounter for general adult medical examination without abnormal findings: Secondary | ICD-10-CM | POA: Diagnosis not present

## 2011-12-13 DIAGNOSIS — M549 Dorsalgia, unspecified: Secondary | ICD-10-CM | POA: Diagnosis not present

## 2011-12-13 DIAGNOSIS — I10 Essential (primary) hypertension: Secondary | ICD-10-CM | POA: Diagnosis not present

## 2011-12-14 DIAGNOSIS — M545 Low back pain, unspecified: Secondary | ICD-10-CM | POA: Diagnosis not present

## 2011-12-14 DIAGNOSIS — M5137 Other intervertebral disc degeneration, lumbosacral region: Secondary | ICD-10-CM | POA: Diagnosis not present

## 2011-12-27 DIAGNOSIS — J342 Deviated nasal septum: Secondary | ICD-10-CM | POA: Diagnosis not present

## 2011-12-27 DIAGNOSIS — M899 Disorder of bone, unspecified: Secondary | ICD-10-CM | POA: Diagnosis not present

## 2011-12-29 DIAGNOSIS — L821 Other seborrheic keratosis: Secondary | ICD-10-CM | POA: Diagnosis not present

## 2011-12-29 DIAGNOSIS — L570 Actinic keratosis: Secondary | ICD-10-CM | POA: Diagnosis not present

## 2011-12-29 DIAGNOSIS — L259 Unspecified contact dermatitis, unspecified cause: Secondary | ICD-10-CM | POA: Diagnosis not present

## 2012-01-05 ENCOUNTER — Encounter (HOSPITAL_COMMUNITY): Payer: Self-pay

## 2012-01-05 ENCOUNTER — Emergency Department (HOSPITAL_COMMUNITY)
Admission: EM | Admit: 2012-01-05 | Discharge: 2012-01-05 | Disposition: A | Discharge status: Home Health | Payer: Medicare Other | Attending: Emergency Medicine | Admitting: Emergency Medicine

## 2012-01-05 ENCOUNTER — Emergency Department (HOSPITAL_COMMUNITY): Payer: Medicare Other

## 2012-01-05 DIAGNOSIS — R079 Chest pain, unspecified: Secondary | ICD-10-CM | POA: Diagnosis not present

## 2012-01-05 DIAGNOSIS — R112 Nausea with vomiting, unspecified: Secondary | ICD-10-CM | POA: Insufficient documentation

## 2012-01-05 DIAGNOSIS — R0789 Other chest pain: Secondary | ICD-10-CM | POA: Insufficient documentation

## 2012-01-05 DIAGNOSIS — K567 Ileus, unspecified: Secondary | ICD-10-CM

## 2012-01-05 DIAGNOSIS — R0602 Shortness of breath: Secondary | ICD-10-CM | POA: Insufficient documentation

## 2012-01-05 DIAGNOSIS — M542 Cervicalgia: Secondary | ICD-10-CM | POA: Diagnosis not present

## 2012-01-05 DIAGNOSIS — R1012 Left upper quadrant pain: Secondary | ICD-10-CM | POA: Insufficient documentation

## 2012-01-05 DIAGNOSIS — K560 Paralytic ileus: Secondary | ICD-10-CM | POA: Insufficient documentation

## 2012-01-05 DIAGNOSIS — K219 Gastro-esophageal reflux disease without esophagitis: Secondary | ICD-10-CM | POA: Diagnosis not present

## 2012-01-05 DIAGNOSIS — M899 Disorder of bone, unspecified: Secondary | ICD-10-CM | POA: Diagnosis not present

## 2012-01-05 DIAGNOSIS — R1013 Epigastric pain: Secondary | ICD-10-CM | POA: Insufficient documentation

## 2012-01-05 DIAGNOSIS — K449 Diaphragmatic hernia without obstruction or gangrene: Secondary | ICD-10-CM | POA: Diagnosis not present

## 2012-01-05 DIAGNOSIS — R61 Generalized hyperhidrosis: Secondary | ICD-10-CM | POA: Diagnosis not present

## 2012-01-05 DIAGNOSIS — R109 Unspecified abdominal pain: Secondary | ICD-10-CM | POA: Diagnosis not present

## 2012-01-05 DIAGNOSIS — M949 Disorder of cartilage, unspecified: Secondary | ICD-10-CM | POA: Insufficient documentation

## 2012-01-05 LAB — HEPATIC FUNCTION PANEL
Albumin: 4 g/dL (ref 3.5–5.2)
Bilirubin, Direct: <0.1 mg/dL (ref 0.0–0.3)
Total Bilirubin: 0.3 mg/dL (ref 0.3–1.2)

## 2012-01-05 LAB — BASIC METABOLIC PANEL
CO2: 31 mEq/L (ref 19–32)
Calcium: 10.4 mg/dL (ref 8.4–10.5)
GFR calc non Af Amer: 65 mL/min — ABNORMAL LOW (ref >90)
Potassium: 3.4 mEq/L — ABNORMAL LOW (ref 3.5–5.1)
Sodium: 139 mEq/L (ref 135–145)

## 2012-01-05 LAB — POCT I-STAT TROPONIN I: Troponin i, poc: 0 ng/mL (ref 0.00–0.08)

## 2012-01-05 LAB — CBC
MCH: 32.4 pg (ref 26.0–34.0)
Platelets: 231 10*3/uL (ref 150–400)
RBC: 4.97 MIL/uL (ref 3.87–5.11)

## 2012-01-05 MED ORDER — ONDANSETRON HCL 4 MG/2ML IJ SOLN
4.0000 mg | Freq: Once | INTRAMUSCULAR | Status: AC
  Administered 2012-01-05: 4 mg via INTRAVENOUS
  Filled 2012-01-05: qty 2

## 2012-01-05 MED ORDER — GI COCKTAIL ~~LOC~~
30.0000 mL | Freq: Once | ORAL | Status: AC
  Administered 2012-01-05: 30 mL via ORAL
  Filled 2012-01-05: qty 30

## 2012-01-05 MED ORDER — FENTANYL CITRATE 0.05 MG/ML IJ SOLN
INTRAMUSCULAR | Status: AC
  Filled 2012-01-05: qty 2

## 2012-01-05 MED ORDER — ASPIRIN 81 MG PO CHEW
324.0000 mg | CHEWABLE_TABLET | Freq: Once | ORAL | Status: AC
  Administered 2012-01-05: 324 mg via ORAL
  Filled 2012-01-05: qty 4

## 2012-01-05 MED ORDER — SODIUM CHLORIDE 0.9 % IV BOLUS (SEPSIS)
500.0000 mL | Freq: Once | INTRAVENOUS | Status: AC
  Administered 2012-01-05: 18:00:00 via INTRAVENOUS

## 2012-01-05 MED ORDER — FENTANYL CITRATE 0.05 MG/ML IJ SOLN
100.0000 ug | Freq: Once | INTRAMUSCULAR | Status: AC
  Administered 2012-01-05: 100 ug via INTRAVENOUS
  Filled 2012-01-05: qty 2

## 2012-01-05 MED ORDER — FENTANYL CITRATE 0.05 MG/ML IJ SOLN: 50.0000 ug | Freq: Once | INTRAMUSCULAR | Status: DC

## 2012-01-05 MED ORDER — ONDANSETRON HCL 4 MG PO TABS: 4.0000 mg | ORAL_TABLET | Freq: Four times a day (QID) | ORAL | Status: AC

## 2012-01-05 NOTE — ED Provider Notes (Signed)
History     CSN: [id number]  Arrival date & time 01/05/12  1500   First MD Initiated Contact with Patient 01/05/12 1735      Chief Complaint  Patient presents with  . Chest Pain    (Consider location/radiation/quality/duration/timing/severity/associated sxs/prior treatment) HPI  Patient presents to emergency department complaining of acute onset epigastric pain with radiation of pain into her left upper abdomen, left chest, and up into the left side of her neck. Patient states symptoms were acute onset and began at 12:30 PM this afternoon. Patient states she was at [facility], a restaurant in town, and just shortly after starting to eat began to feel an acute onset of fullness and discomfort in her epigastric region. Patient states that she then became nauseated, sweaty, and mildly short of breath and vomited x3. Patient states she's been dry heaving since. Patient states episode is similar to an episode she had approximately one month ago. Patient states at that time she was eating at another restaurant and just shortly after beginning she began to have this fullness or discomfort in her epigastric region however she did not vomit at that time. Patient states she felt very nauseated and went home and within the hour the symptoms resolved. Patient states that current symptoms have been persistent and more severe. Patient states she's having ongoing epigastric and left upper abdominal pain with pain in her left chest and her left neck. She states she still feels nauseated. She denies any current shortness of breath. Patient has taken nothing for symptoms prior to arrival. Patient states she has numerous spine and orthopedic problems for which she takes daily pain medicine but denies any cardiac or pulmonary disease. Patient states she has mild high blood pressure that is well controlled at home with antihypertensives. She denies history of high cholesterol or diabetes. Patient denies alcohol, tobacco,  or illicit drug use. Patient is followed by Dr. Jacky Kindle, her primary care physician. Patient states that greater than 5 years ago she had been seen by Dr. Donnie Aho, cardiologist, for a "baseline cardiac exam." Patient states has not required follow up with cardiology since then. Patient denies any family history of early heart disease or heart attack. Patient denies aggravating or alleviating factors.  Past Medical History  Diagnosis Date  . PMB (postmenopausal bleeding)   . Osteopenia   . Renal artery stenosis   . Reflux     Past Surgical History  Procedure Date  . Vaginal hysterectomy 1993    [facility] BSO  . Tubal ligation   . Total hip arthroplasty   . Breast surgery     Fibroma  . Intraoperative arteriogram   . Verteboplasty   . Spinal fusion   . Neuro stimulator   . Oophorectomy     BSO    Family History  Problem Relation Age of Onset  . Hypertension Mother   . COPD Mother   . Heart disease Father   . Hypertension Father   . Breast cancer Maternal Grandmother     History  Substance Use Topics  . Smoking status: Former Games developer  . Smokeless tobacco: Not on file  . Alcohol Use: No    OB History    Grav Para Term Preterm Abortions TAB SAB Ect Mult Living   2 2 2       2       Review of Systems  All other systems reviewed and are negative.    Allergies  Ceftin; Cymbalta; Dicloxacillin; Doxycycline; Metronidazole; and Promethazine  hcl  Home Medications   Current Outpatient Rx  Name Route Sig Dispense Refill  . CALCIUM CITRATE-VITAMIN D 200-200 MG-UNIT PO TABS Oral Take 1 tablet by mouth daily.    Marland Kitchen CITALOPRAM HYDROBROMIDE 20 MG PO TABS Oral Take 20 mg by mouth 2 (two) times daily.      . DOC-Q-LACE PO Oral Take 1 tablet by mouth daily.    Marland Kitchen GABAPENTIN 300 MG PO CAPS Oral Take 300 mg by mouth 3 (three) times daily.    Marland Kitchen LOSARTAN POTASSIUM-HCTZ 50-12.5 MG PO TABS Oral Take 1 tablet by mouth daily.      Marland Kitchen ONE-DAILY MULTI VITAMINS PO TABS Oral Take 1 tablet by  mouth daily.      Marland Kitchen OMEPRAZOLE 20 MG PO CPDR Oral Take 20 mg by mouth daily.      . OXYCODONE HCL ER 20 MG PO TB12 Oral Take 20 mg by mouth every 12 (twelve) hours.    . OXYCODONE-ACETAMINOPHEN 5-325 MG PO TABS Oral Take 1 tablet by mouth every 4 (four) hours as needed.    Marland Kitchen ALIGN PO Oral Take by mouth.      . QUINIDINE SULFATE 300 MG PO TABS Oral Take 300 mg by mouth every 6 (six) hours.    Marland Kitchen TEMAZEPAM 30 MG PO CAPS Oral Take 15 mg by mouth at bedtime as needed.       BP 180/100  Pulse 88  Temp(Src) 97.2 F (36.2 C) (Oral)  Resp 20  Ht 5\' 1"  (1.549 m)  Wt 135 lb (61.236 kg)  BMI 25.51 kg/m2  SpO2 97%  Physical Exam  Nursing note and vitals reviewed. Constitutional: She is oriented to person, place, and time. She appears well-developed and well-nourished. No distress.  HENT:  Head: Normocephalic and atraumatic.  Eyes: Conjunctivae are normal.  Neck: Normal range of motion. Neck supple.  Cardiovascular: Normal rate, regular rhythm, normal heart sounds and intact distal pulses.  Exam reveals no gallop and no friction rub.   No murmur heard. Pulmonary/Chest: Effort normal and breath sounds normal. No respiratory distress. She has no wheezes. She has no rales. She exhibits no tenderness.  Abdominal: Soft. Bowel sounds are normal. She exhibits no distension and no mass. There is tenderness. There is no rebound and no guarding.       Mild TTP of epigastric to LUQ. No rigidity or peritoneal signs  Musculoskeletal: Normal range of motion. She exhibits no edema and no tenderness.  Neurological: She is alert and oriented to person, place, and time.  Skin: Skin is warm and dry. No rash noted. She is not diaphoretic. No erythema.  Psychiatric: She has a normal mood and affect.    ED Course  Procedures (including critical care time)  IV fentanyl and zofran   Date: 01/05/2012  Rate: 88  Rhythm: normal sinus rhythm  QRS Axis: normal  Intervals: normal  ST/T Wave abnormalities:  normal  Conduction Disutrbances: none  Narrative Interpretation:   Old EKG Reviewed: non provocative compared to May 16, 2006. No significant changes noted     Labs Reviewed  CBC - Abnormal; Notable for the following:    WBC 10.7 (*)    Hemoglobin 16.1 (*)    HCT 46.4 (*)    All other components within normal limits  BASIC METABOLIC PANEL - Abnormal; Notable for the following:    Potassium 3.4 (*)    Glucose, Bld 109 (*)    GFR calc non Af Amer 65 (*)  GFR calc Af Amer 76 (*)    All other components within normal limits  POCT I-STAT TROPONIN I  LIPASE, BLOOD  HEPATIC FUNCTION PANEL  POCT I-STAT TROPONIN I   Dg Abd Acute W/chest  01/05/2012  *RADIOLOGY REPORT*  Clinical Data: Left chest pain and flank pain, pleuritic.  ACUTE ABDOMEN SERIES (ABDOMEN 2 VIEW & CHEST 1 VIEW)  Comparison: 10/26/2009 CT scan  Findings: Large hiatal hernia noted at the left lung base.  Dorsal column stimulator projects over the T8-9 level.  There is posterolateral rod and pedicle screw fixation in the lumbar spine and lower thoracic spine along with a left total hip prosthesis. Considerable bone graft material noted along the lower lumbar spine, with evidence of prior posterior decompression.  Mild atelectasis node along the margins of hiatal hernia.  No free intraperitoneal gas is evident within the abdomen.  There is borderline prominence of stool in the colon.  Scattered air-fluid levels in nondilated left sided bowel noted on the upright view, possibly reflecting localized ileus or enteritis.  No dilated bowel noted to suggest obstruction.  IMPRESSION:  1.  Scattered air-fluid levels in the left abdominal small bowel, possibly from localized ileus or enteritis. 2.  Thoracolumbar postoperative findings, with left hip implant and dorsal column stimulator. 3.  Large chronic hiatal hernia in the left chest, with adjacent subsegmental atelectasis.  Original Report Authenticated By: Dellia Cloud, M.D.      1. Ileus       MDM  Patient is low risk for ACS with out significant findings on EKG or troponin after hours of consent radiation of pain from left UQ to chest. Abdomen with mild TTP of LUQ but tolerating fluids well and xray suggestive of mild ileus. No other acute findings. Patient is agreeable to following up with Dr. Jacky Kindle in office soon, calling him tomorrow for close follow up but return to ER for any emergent changing or worsening of symptoms.         Jenness [place], [place] 01/05/12 2119

## 2012-01-05 NOTE — ED Notes (Signed)
Central chest pain began about 2 hours ago while [employer] 34, eating at the time Severe chest pain pt. Also is having vomiting and pain radiates into her back

## 2012-01-05 NOTE — ED Notes (Signed)
Rn Hiram Gash pulled fentanyl as originally ordered.  Scanned as given and proceeded to throw the entire vial into sharps container.  Wasted in pyxis with charge RN Thayer Ohm and pulled and gave re-placement fentanyl

## 2012-01-05 NOTE — ED Notes (Signed)
Dr Pickering at bedside 

## 2012-01-05 NOTE — ED Notes (Signed)
Patient currently sitting up in bed; no respiratory or acute distress noted; patient updated on plan of care; informed patient that we are currently waiting on discharge paperwork from [facility].  Patient has no other questions or concerns at this time; will continue to monitor.

## 2012-01-05 NOTE — ED Notes (Signed)
Pt is excessively demanding and c/o the bed being to soft for her back, That RN should "hurry up" with zofran, that RN should hurry up in asking MD for medication for her reflux and wants something fast acting not what she takes at home.  Pt asked family member to bring egg crate mattress.  Pt sitting on side of the bed bc her reflux hurts her throat if she lies down. Pt c/o the room being to cold and that her shoes need to be removed.

## 2012-01-05 NOTE — ED Notes (Signed)
Patient currently resting quietly in bed; no respiratory or acute distress noted.  Patient updated on plan of care; informed patient that we are currently waiting on x-ray results to come back.  Patient has no other questions or concerns at this time; will continue to monitor. 

## 2012-01-05 NOTE — ED Notes (Signed)
Patient given discharge paperwork; went over discharge instructions with patient.  Instructed patient to follow up with primary care physician tomorrow, to take Zofran as directed, and to return to the ED for new, worsening, or concerning symptoms.

## 2012-01-05 NOTE — Discharge Instructions (Signed)
Call Dr. Lanell Matar office first thing in the morning to establish close followup for recheck of abdominal pain and slowed bowel movement seen on x-ray. Return to emergency department for any emergent changing or worsening symptoms. Use Zofran as needed for nausea.  Ileus The intestine (bowel, or gut) is a long muscular tube connecting your stomach to your rectum. If the intestine stops working, food cannot pass through. This is called an ileus. This can happen for a variety of reasons. Ileus is a major medical problem that usually requires hospitalization. If your intestine stops working because of a blockage, this is called a bowel obstruction, and is a different condition. CAUSES   Surgery in your abdomen. This can last from a few hours to a few days.   An infection or inflammation in the belly (abdomen). This includes inflammation of the lining of the abdomen (peritonitis).   Infection or inflammation in other parts of the body, such as pneumonia or pancreatitis.   Passage of gallstones or kidney stones.   Damage to the nerves or blood vessels which go to the bowel.   Imbalance in the salts in the blood (electrolytes).   Injury to the brain and or spinal cord.   Medications. Many medications can cause ileus or make it worse. The most common of these are strong pain medications.  SYMPTOMS  Symptoms of bowel obstruction come from the bowel inactivity. They may include:  Bloating. Your belly gets bigger (distension).   Pain or discomfort in the abdomen.   Poor appetite, feeling sick to your stomach (nausea) and vomiting.   You may also not be able to hear your normal bowel sounds, such as "growling" in your stomach.  DIAGNOSIS   Your history and a physical exam will usually suggest to your caregiver that you have an ileus.   X-rays or a CT scan of your abdomen will confirm the diagnosis. X-rays, CT scans and lab tests may also suggest the cause.  TREATMENT   Rest the intestine  until it starts working again. This is most often accomplished by:   Stopping intake of oral food and drink. Dehydration is prevented by using IV (intravenous) fluids.   Sometimes, a naso-gastric tube (NG tube) is needed. This is a narrow plastic tube inserted through your nose and into your stomach. It is connected to suction to keep the stomach emptied out. This also helps treat the nausea and vomiting.   If there is an imbalance in the electrolytes, they are corrected with supplements in your intravenous fluids.   Medications that might make an ileus worse might be stopped.   There are no medications that reliably treat ileus, though your caregiver may suggest a trial of certain medications.   If your condition is slow to resolve, you will be re-evaluated to be sure another condition, such as a blockage, is not present.  Ileus is common and usually has a good outcome. Depending on cause of your ileus, it usually can be treated by your caregivers with good results. Sometimes, specialists (surgeons or gastroenterologists) are asked to assist in your care.  HOME CARE INSTRUCTIONS   Follow your caregiver's instructions regarding diet and fluid intake. This will usually include drinking plenty of clear fluids, avoiding alcohol and caffeine, and eating a gentle diet.   Follow your caregiver's instructions regarding activity. A period of rest is sometimes advised before returning to work or school.   Take only medications prescribed by your caregiver. Be especially careful with narcotic pain  medication, which can slow your bowel activity and contribute to ileus.   Keep any follow-up appointments with your caregiver or specialists.  SEEK MEDICAL CARE IF:   You have a recurrence of nausea, vomiting or abdominal discomfort.   You develop fever of more than 102 F (38.9 C).  SEEK IMMEDIATE MEDICAL CARE IF:   You have severe abdominal pain.   You are unable to keep fluids down.  Document  Released: 10/27/2003 Document Revised: 07/06/2011 Document Reviewed: 02/26/2009 [facility] Patient Information 2012 [place], [place].

## 2012-01-05 NOTE — ED Notes (Signed)
Received bedside report from [place], Charity fundraiser.  Patient currently resting quietly in bed; no respiratory or acute distress noted.  Introduced self to patient and updated whiteboard in room.  Updated patient on plan of care; informed patient that we are currently waiting on the EDP to come and talk to patient about lab/test results.  Patient has no other questions or concerns at this time; will continue to monitor.

## 2012-01-05 NOTE — ED Provider Notes (Signed)
Medical screening examination/treatment/procedure(s) were conducted as a shared visit with non-physician practitioner(s) and myself.  I personally evaluated the patient during the encounter. Patient has left upper quadrant tenderness. Doubt cardiac cause. Ileus versus enteritis on x-ray. She is feeling better we'll be discharged to follow with her doctors  Juliet Rude. Rubin Payor, MD 01/05/12 [id number]

## 2012-01-10 DIAGNOSIS — K560 Paralytic ileus: Secondary | ICD-10-CM | POA: Diagnosis not present

## 2012-01-10 DIAGNOSIS — M549 Dorsalgia, unspecified: Secondary | ICD-10-CM | POA: Diagnosis not present

## 2012-01-10 DIAGNOSIS — K219 Gastro-esophageal reflux disease without esophagitis: Secondary | ICD-10-CM | POA: Diagnosis not present

## 2012-02-07 DIAGNOSIS — IMO0002 Reserved for concepts with insufficient information to code with codable children: Secondary | ICD-10-CM | POA: Diagnosis not present

## 2012-02-07 DIAGNOSIS — M47812 Spondylosis without myelopathy or radiculopathy, cervical region: Secondary | ICD-10-CM | POA: Diagnosis not present

## 2012-02-07 DIAGNOSIS — G894 Chronic pain syndrome: Secondary | ICD-10-CM | POA: Diagnosis not present

## 2012-02-07 DIAGNOSIS — M47817 Spondylosis without myelopathy or radiculopathy, lumbosacral region: Secondary | ICD-10-CM | POA: Diagnosis not present

## 2012-02-09 ENCOUNTER — Other Ambulatory Visit: Payer: Self-pay | Admitting: Dermatology

## 2012-02-09 DIAGNOSIS — L738 Other specified follicular disorders: Secondary | ICD-10-CM | POA: Diagnosis not present

## 2012-02-09 DIAGNOSIS — L821 Other seborrheic keratosis: Secondary | ICD-10-CM | POA: Diagnosis not present

## 2012-02-09 DIAGNOSIS — L909 Atrophic disorder of skin, unspecified: Secondary | ICD-10-CM | POA: Diagnosis not present

## 2012-02-09 DIAGNOSIS — L851 Acquired keratosis [keratoderma] palmaris et plantaris: Secondary | ICD-10-CM | POA: Diagnosis not present

## 2012-02-09 DIAGNOSIS — D239 Other benign neoplasm of skin, unspecified: Secondary | ICD-10-CM | POA: Diagnosis not present

## 2012-02-09 DIAGNOSIS — L570 Actinic keratosis: Secondary | ICD-10-CM | POA: Diagnosis not present

## 2012-02-09 DIAGNOSIS — D485 Neoplasm of uncertain behavior of skin: Secondary | ICD-10-CM | POA: Diagnosis not present

## 2012-03-12 DIAGNOSIS — I10 Essential (primary) hypertension: Secondary | ICD-10-CM | POA: Diagnosis not present

## 2012-03-12 DIAGNOSIS — M549 Dorsalgia, unspecified: Secondary | ICD-10-CM | POA: Diagnosis not present

## 2012-03-12 DIAGNOSIS — K5900 Constipation, unspecified: Secondary | ICD-10-CM | POA: Diagnosis not present

## 2012-03-12 DIAGNOSIS — K449 Diaphragmatic hernia without obstruction or gangrene: Secondary | ICD-10-CM | POA: Diagnosis not present

## 2012-03-12 DIAGNOSIS — K219 Gastro-esophageal reflux disease without esophagitis: Secondary | ICD-10-CM | POA: Diagnosis not present

## 2012-03-12 DIAGNOSIS — R109 Unspecified abdominal pain: Secondary | ICD-10-CM | POA: Diagnosis not present

## 2012-03-14 ENCOUNTER — Telehealth: Payer: Self-pay | Admitting: Internal Medicine

## 2012-03-14 NOTE — Telephone Encounter (Signed)
Pt having problems with left upper quadrant abdominal pain. Pt scheduled to see Mike Gip PA Monday 03/19/12@2pm . Pt aware of appt date and time.

## 2012-03-19 ENCOUNTER — Encounter: Payer: Self-pay | Admitting: Physician Assistant

## 2012-03-19 ENCOUNTER — Ambulatory Visit (INDEPENDENT_AMBULATORY_CARE_PROVIDER_SITE_OTHER): Payer: Medicare Other | Admitting: Physician Assistant

## 2012-03-19 VITALS — BP 90/60 | HR 76 | Ht 61.0 in | Wt 140.1 lb

## 2012-03-19 DIAGNOSIS — R1012 Left upper quadrant pain: Secondary | ICD-10-CM | POA: Diagnosis not present

## 2012-03-19 DIAGNOSIS — R935 Abnormal findings on diagnostic imaging of other abdominal regions, including retroperitoneum: Secondary | ICD-10-CM

## 2012-03-19 DIAGNOSIS — R112 Nausea with vomiting, unspecified: Secondary | ICD-10-CM | POA: Diagnosis not present

## 2012-03-19 NOTE — Progress Notes (Signed)
Subjective:    Patient ID: Stacy Parker, female    DOB: June 13, 1934, 78 y.o.   MRN: [id number]  HPI Stacy Parker is a pleasant 78 year old female, known to Dr. Marina Goodell from prior endoscopic evaluation. She had undergone upper endoscopy in December of 2010 which showed an active distal esophagitis and a distal esophageal stricture which was not dilated. She also had colonoscopy in January 2010 we showed severe diverticulosis and an incomplete exam to 2 stenosis in the sigmoid colon there She had subsequent barium enema.  She was referred here today per Dr. Lanell Matar office after 3 recent episodes of severe left upper quadrant abdominal pain associated with nausea and vomiting. She says she has had 3 attacks each about 1 month apart on the last occurring on May 1. She says she is has extreme left upper quadrant pain with these episodes they have typically been lasting anywhere from 3-6 hours and are associated with nausea and vomiting. In between these episodes she says she feels fine her bowels were moving without difficulty as she has not noted any changes in her bowel habits no melena or hematochezia but does take a stool softener. She also is on OxyContin only regular basis due to multilevel disc disease and prior spinal fusion. She says she does have sciatic nerve pain chronically and has a neurostimulator which has helped quite a bit.  Patient underwent CT scan of the abdomen and pelvis on 03/12/2012 by [facility]. 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 It also noted to have gallstones without evidence of gallbladder wall thickening.    Review of Systems  Constitutional: Negative.   HENT: Negative.   Eyes: Negative.   Respiratory: Negative.   Cardiovascular:  Negative.   Gastrointestinal: Positive for nausea, vomiting and abdominal pain.  Genitourinary: Negative.   Musculoskeletal: Positive for back pain.  Neurological: Negative.   Hematological: Negative.   Psychiatric/Behavioral: Negative.    Outpatient Prescriptions Prior to Visit  Medication Sig Dispense Refill  . calcium citrate-vitamin D 200-200 MG-UNIT TABS Take 1 tablet by mouth daily.      . citalopram (CELEXA) 20 MG tablet Take 20 mg by mouth 2 (two) times daily.        Tery Sanfilippo Sodium (DOC-Q-LACE PO) Take 1 tablet by mouth daily.      Marland Kitchen gabapentin (NEURONTIN) 300 MG capsule Take 300 mg by mouth 3 (three) times daily.      Marland Kitchen losartan-hydrochlorothiazide (HYZAAR) 50-12.5 MG per tablet Take 1 tablet by mouth daily.        . Multiple Vitamin (MULTIVITAMIN) tablet Take 1 tablet by mouth daily.        Marland Kitchen omeprazole (PRILOSEC) 20 MG capsule Take 20 mg by mouth daily.        Marland Kitchen oxyCODONE (OXYCONTIN) 20 MG 12 hr tablet Take 20 mg by mouth every 12 (twelve) hours.      . Probiotic Product (ALIGN PO) Take by mouth.        . quiNIDine sulfate 300 MG tablet Take 300 mg by mouth every 6 (six) hours.      . temazepam (RESTORIL) 30 MG capsule Take 15 mg by mouth at bedtime as needed.       Marland Kitchen oxyCODONE-acetaminophen (PERCOCET) 5-325 MG per tablet  Take 1 tablet by mouth every 4 (four) hours as needed.      . temazepam (RESTORIL) 30 MG capsule Take 1 capsule (30 mg total) by mouth at bedtime as needed for sleep.  90 capsule  1      Allergies  Allergen Reactions  . Boniva (Ibandronic Acid)   . Ceftin   . Cymbalta (Duloxetine Hcl) Nausea Only  . Dicloxacillin   . Doxycycline   . Metronidazole   . Promethazine Hcl     REACTION: Restless leg?     Patient Active Problem List  Diagnoses  . COLONIC POLYPS, ADENOMATOUS  . ESOPHAGEAL STRICTURE  . GERD  . HIATAL HERNIA  . DIVERTICULOSIS, COLON  . RECTAL BLEEDING  . OTHER SPECIFIED DISORDER OF INTESTINES  . WEIGHT LOSS-ABNORMAL  . DIARRHEA    . PERSONAL HX COLONIC POLYPS  . PMB (postmenopausal bleeding)  . Osteopenia    Objective:   Physical Exam well-developed older white female in no acute distress, pleasant blood pressure 90/60 pulse 76 height 5 one weight 140. HEENT; nontraumatic normocephalic EOMI PERRLA sclera anicteric conjunctiva pink, Neck; supple no JVD, Cardiovascular; regular rate and rhythm with S1 and S2 no murmur or gallop, Pulmonary; clear bilaterally, Abdomen; soft nontender nondistended bowel sounds are active there is no palpable mass or hepatosplenomegaly, Rectal ;exam not done, Extremities ;no clubbing cyanosis or edema skin warm and dry, Psych; mood and affect normal and appropriate.        Assessment & Plan:  #79  78 year old female with 3 recent episodes of severe left upper quadrant pain associated with nausea and vomiting which are certainly secondary to the left diaphragmatic defect and herniation of her stomach and colon into the left thorax. She may be intermittently incarcerating, or having gastric volvulus. #2 severe sigmoid diverticulosis #3 Multi- level disc disease status post spinal fusion with persistent sciatic pain and current use of a neurostimulator. Plan; findings discussed with the patient in detail, she needs surgical evaluation and have scheduled her appointment with Dr. Wenda Low at [facility] surgery. Patient is aware should she have an acute episode which does not resolve within a couple of hours she should proceed to the emergency room. There is no indication for endoscopic intervention at this time.

## 2012-03-19 NOTE — Patient Instructions (Addendum)
We made you an appointment with Dr Daphine Deutscher. Date is 04-19-2012 and you need to arrive at 8:50 AM .  We will try to call and get a sooner appt but we cannot promise one.

## 2012-03-19 NOTE — Progress Notes (Signed)
I discussed the patients symptoms and CT finding with AE. I have reviewed AE's consult note and agree with her assessment and recommendation for surgical consultation.

## 2012-03-20 ENCOUNTER — Telehealth (INDEPENDENT_AMBULATORY_CARE_PROVIDER_SITE_OTHER): Payer: Self-pay

## 2012-03-20 NOTE — Telephone Encounter (Signed)
Dr. Daphine Deutscher paged to call Dr. Jacky Kindle [phone].

## 2012-03-22 ENCOUNTER — Telehealth (INDEPENDENT_AMBULATORY_CARE_PROVIDER_SITE_OTHER): Payer: Self-pay | Admitting: General Surgery

## 2012-03-22 DIAGNOSIS — K449 Diaphragmatic hernia without obstruction or gangrene: Secondary | ICD-10-CM

## 2012-03-22 NOTE — Telephone Encounter (Signed)
Called patient at [phone]. Left message per her authorization given to Dr. Daphine Deutscher during previous phone call this morning. Patient advised of appointment with [facility] at [address] location on 03/26/12 arrive at 7:45 for a 8:00 appointment, npo after midnight. Advised in message to call if there were any questions. Advised test had to be done before seeing Dr. Daphine Deutscher on 03/27/12 (nurse only visit).

## 2012-03-22 NOTE — Telephone Encounter (Signed)
Called patient on her cell phone and advised of appointment made with [facility] on 03/26/12. Advised to be there at 7:45 for a 8:00 appointment, npo after midnight. I gave the patient the number to the facility in order to find out what "landmarks" were near the building that she needed to look out for in order to help her not get lost and arrive on time.

## 2012-03-22 NOTE — Telephone Encounter (Signed)
Patient called back and clarified that she actually called [facility] last week trying to be seen. Patient ended up speaking to Dr. Daphine Deutscher directly, she advised him that she is concerned about possible strangulation when food/drink are consumed. He wants patient seen for a nurse only visit on 03/27/12 at 4:30 (and to be paged when she arrives). He advised the patient he will order an upper gi test. Patient fully agreeable to any appointment time as she wants to get this resolved as quickly as possible.

## 2012-03-22 NOTE — Telephone Encounter (Signed)
Called back patient at [phone]. Left message for return call. Patient needs to be advised that her referring physician, not Dr. Daphine Deutscher needs to refer her to [facility] for her hiatal hernia. Per [facility] records, this patient has never been seen by Dr. Daphine Deutscher, thus the referring physician needs to refer her to [facility] if that is what she wants.

## 2012-03-26 ENCOUNTER — Ambulatory Visit
Admission: RE | Admit: 2012-03-26 | Discharge: 2012-03-26 | Disposition: A | Discharge status: Home / Self Care | Payer: Medicare Other | Source: Ambulatory Visit | Attending: Surgery | Admitting: Surgery

## 2012-03-26 DIAGNOSIS — K449 Diaphragmatic hernia without obstruction or gangrene: Secondary | ICD-10-CM

## 2012-03-26 DIAGNOSIS — M47812 Spondylosis without myelopathy or radiculopathy, cervical region: Secondary | ICD-10-CM | POA: Diagnosis not present

## 2012-03-27 ENCOUNTER — Ambulatory Visit (INDEPENDENT_AMBULATORY_CARE_PROVIDER_SITE_OTHER): Payer: Medicare Other | Admitting: Surgery

## 2012-03-27 ENCOUNTER — Telehealth (INDEPENDENT_AMBULATORY_CARE_PROVIDER_SITE_OTHER): Payer: Self-pay | Admitting: General Surgery

## 2012-03-27 VITALS — BP 118/72 | HR 68 | Temp 97.6°F | Resp 18 | Ht 63.0 in | Wt 135.0 lb

## 2012-03-27 DIAGNOSIS — K449 Diaphragmatic hernia without obstruction or gangrene: Secondary | ICD-10-CM

## 2012-03-27 NOTE — Telephone Encounter (Signed)
Called patient (cell) to advise that Dr. Daphine Deutscher wanted her to come in for nurse only visit at 4:00 instead of 4:30. Patient agreed and said she could come earlier if necessary.

## 2012-03-27 NOTE — Progress Notes (Signed)
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 female followed by Yancey Flemings has recently gotten more symptomatic with a large hiatus hernia.  I reveiwed her disk of a CT at [facility] and the UGI series that I ordered.  I discussed lap repair of his large hiatus hernia in detail and gave her the booklet on the procedure.  I worked her appt in during [facility].  She has a complicated medical history including osteopenia and is followed by Dr. Othelia Pulling.    Past Medical History  Diagnosis Date  . PMB (postmenopausal bleeding)   . Osteopenia   . Renal artery stenosis   . Reflux   . Sciatic nerve disease   . Depression   . Anxiety   . Insomnia   . HTN (hypertension)   . Basal cell carcinoma   . Squamous carcinoma   . Diverticulosis     Past Surgical History  Procedure Date  . Total vaginal hysterectomy 1993  . Tubal ligation   . Total hip arthroplasty     left  . Breast surgery     Fibroma, right  . Intraoperative arteriogram   . Verteboplasty   . Spinal fusion     8 level  . Neuro stimulator     Current Outpatient Prescriptions  Medication Sig Dispense Refill  . calcium citrate-vitamin D 200-200 MG-UNIT TABS Take 1 tablet by mouth daily.      . citalopram (CELEXA) 20 MG tablet Take 20 mg by mouth 2 (two) times daily.        Tery Sanfilippo Sodium (DOC-Q-LACE PO) Take 1 tablet by mouth daily.      Marland Kitchen gabapentin (NEURONTIN) 300 MG capsule Take 300 mg by mouth 3 (three) times daily.      Marland Kitchen losartan-hydrochlorothiazide (HYZAAR) 50-12.5 MG per tablet Take 1 tablet by mouth daily.        . Multiple Vitamin (MULTIVITAMIN) tablet Take 1 tablet by mouth daily.        Marland Kitchen omeprazole (PRILOSEC) 20 MG capsule Take 20 mg by mouth daily.        Marland Kitchen oxyCODONE (OXYCONTIN) 20 MG 12 hr tablet Take 20 mg by mouth every 12 (twelve) hours.      Marland Kitchen oxyCODONE-acetaminophen (PERCOCET) 5-325 MG per tablet Take 1  tablet by mouth every 4 (four) hours as needed.      . Probiotic Product (ALIGN PO) Take by mouth.        . quiNIDine sulfate 300 MG tablet Take 300 mg by mouth every 6 (six) hours.      . temazepam (RESTORIL) 30 MG capsule Take 1 capsule (30 mg total) by mouth at bedtime as needed for sleep.  90 capsule  1  . temazepam (RESTORIL) 30 MG capsule Take 15 mg by mouth at bedtime as needed.        Boniva; Ceftin; Cymbalta; Dicloxacillin; Doxycycline; Metronidazole; and Promethazine hcl Family History  Problem Relation Age of Onset  . Hypertension Mother   . COPD Mother   . Heart disease Father   . Hypertension Father   . Breast cancer Maternal Grandmother   . Skin cancer Father    Social History:   reports that she has quit smoking. Her smoking use included Cigarettes. She has never used smokeless tobacco. She reports that she drinks alcohol. She reports that she does not use illicit drugs.   REVIEW  OF SYSTEMS - PERTINENT POSITIVES ONLY: Left leg DVT after her back surgery at [facility])  Physical Exam:   Blood pressure 118/72, pulse 68, temperature 97.6 F (36.4 C), temperature source Temporal, resp. rate 18, height 5\' 3"  (1.6 m), weight 135 lb (61.236 kg). Body mass index is 23.91 kg/(m^2).  Gen:  WDWN WF NAD  Neurological: Alert and oriented to person, place, and time. Motor and sensory function is grossly intact  Head: Normocephalic and atraumatic.  Eyes: Conjunctivae are normal. Pupils are equal, round, and reactive to light. No scleral icterus.  Neck: Normal range of motion. Neck supple. No tracheal deviation or thyromegaly present.  Cardiovascular:  SR without murmurs or gallops.  No carotid bruits Respiratory: Effort normal.  No respiratory distress. No chest wall tenderness. Breath sounds normal.  No wheezes, rales or rhonchi.  Abdomen:  Flat, nontender Significant back issues with nerve stimulator and chronic oxycontin administration.   LABORATORY RESULTS: No results  found for this or any previous visit (from the past 48 hour(s)).  RADIOLOGY RESULTS: Dg Ugi W/high Density W/kub  03/26/2012  *RADIOLOGY REPORT*  Clinical Data: Hiatal hernia.  UPPER GI SERIES W/HIGH DENSITY W/KUB  Technique: Double contrast upper GI was performed.  Comparison:  01/05/2012  Findings: Scout KUB demonstrates prominence of stool in the colon. The dorsal column stimulator projects over the right abdomen, and there is a left total hip implant left total hip prosthesis in addition to posterolateral rod pedicle screws extending from T12 through the sacrum.  Laryngeal penetration noted during the pharyngeal phase of swallowing.  Cervical spondylosis noted with grade 1 anterolisthesis of C4 and C5, and considerable loss of disc height that C5-6 and C6-7. Primary peristaltic waves of the esophagus were normal on 3/4 swallows.  No esophageal stricture or significant distal esophageal fold thickening is observed.  The patient has mobility difficulties due to the spinal hardware, back pain, and dorsal column stimulator.  Accordingly, gastric images were obtained primarily in the oblique supine positions rather than oblique prone positions.  A large hiatal hernia is noted, with gastric rotation such that the greater curvature bases the upper left and the lesser curvature of the lower right of the patient. Nearly the entire stomach is herniated, with the duodenal bulb just below the hemidiaphragm. The ligament of Treitz is felt to be in the proper position.  No gastric ulcer or gastric masses observed.  IMPRESSION:  1.  Large hiatal hernia containing essentially the entire stomach. Compared to the CT scan from 10/26/2009, a greater amount of the stomach is above the left hemidiaphragm, with organoaxial rotation of the stomach.  The esophagus does not extend below the hemidiaphragm (type 1 hernia). 2. Prominence of stool throughout the colon suggests constipation. 3.  Cervical spondylosis. 3.  Laryngeal  penetration noted during the pharyngeal phase of swallowing.  Original Report Authenticated By: Dellia Cloud, M.D.    Problem List: Patient Active Problem List  Diagnoses  . COLONIC POLYPS, ADENOMATOUS  . ESOPHAGEAL STRICTURE  . GERD  . HIATAL HERNIA  . DIVERTICULOSIS, COLON  . RECTAL BLEEDING  . OTHER SPECIFIED DISORDER OF INTESTINES  . WEIGHT LOSS-ABNORMAL  . DIARRHEA  . PERSONAL HX COLONIC POLYPS  . PMB (postmenopausal bleeding)  . 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  Will arrange surgery at [facility] .      Matt B. Daphine Deutscher, MD, [facility] [phone] beeper [phone]  03/27/2012  5:00 PM

## 2012-04-03 ENCOUNTER — Telehealth (INDEPENDENT_AMBULATORY_CARE_PROVIDER_SITE_OTHER): Payer: Self-pay | Admitting: General Surgery

## 2012-04-03 DIAGNOSIS — M47817 Spondylosis without myelopathy or radiculopathy, lumbosacral region: Secondary | ICD-10-CM | POA: Diagnosis not present

## 2012-04-03 DIAGNOSIS — G894 Chronic pain syndrome: Secondary | ICD-10-CM | POA: Diagnosis not present

## 2012-04-03 DIAGNOSIS — M961 Postlaminectomy syndrome, not elsewhere classified: Secondary | ICD-10-CM | POA: Diagnosis not present

## 2012-04-03 DIAGNOSIS — IMO0002 Reserved for concepts with insufficient information to code with codable children: Secondary | ICD-10-CM | POA: Diagnosis not present

## 2012-04-03 NOTE — Telephone Encounter (Signed)
Called patient back and she is concerned as to being able to still take her medications issued by Dr. Vear Clock (Citalopram, Oxycontin, Gabapentin) after the surgery. She has had spinal fusion and concerned that she will not be able to take the Oxycontin she has to take regularly for pain management. Patient advised that Dr. Vear Clock told her he can issue her medications in liquid form, but she needs to let him know well before the surgery because he has to make "changes" in order to have them issued. Patient is concerned because of her pain level she cannot go without the medications needed. I advised that I would discuss with Dr. Daphine Deutscher tomorrow and that one of [place] would call her back with an answer. Patient gave permission for message to be left on voicemail of home and cell phone.

## 2012-04-05 ENCOUNTER — Telehealth (INDEPENDENT_AMBULATORY_CARE_PROVIDER_SITE_OTHER): Payer: Self-pay | Admitting: General Surgery

## 2012-04-05 NOTE — Telephone Encounter (Signed)
Called patient and left message regarding medication and testing. Called her cell phone and was able to reach her. Advised she will be able to take her medication as she does now, no need for liquid form at this time. Also advised Dr. Daphine Deutscher stating testing will not be an issue, no concern about radiation exposure.

## 2012-04-11 ENCOUNTER — Ambulatory Visit (INDEPENDENT_AMBULATORY_CARE_PROVIDER_SITE_OTHER): Payer: Self-pay | Admitting: Surgery

## 2012-04-11 ENCOUNTER — Encounter (HOSPITAL_COMMUNITY): Payer: Self-pay | Admitting: Pharmacy Technician

## 2012-04-12 ENCOUNTER — Encounter (HOSPITAL_COMMUNITY): Payer: Self-pay

## 2012-04-12 ENCOUNTER — Encounter (HOSPITAL_COMMUNITY)
Admission: RE | Admit: 2012-04-12 | Discharge: 2012-04-12 | Disposition: A | Discharge status: Home / Self Care | Payer: Medicare Other | Source: Ambulatory Visit | Attending: Surgery | Admitting: Surgery

## 2012-04-12 ENCOUNTER — Telehealth (INDEPENDENT_AMBULATORY_CARE_PROVIDER_SITE_OTHER): Payer: Self-pay | Admitting: General Surgery

## 2012-04-12 DIAGNOSIS — G4730 Sleep apnea, unspecified: Secondary | ICD-10-CM

## 2012-04-12 HISTORY — DX: Personal history of other diseases of the digestive system: Z87.19

## 2012-04-12 HISTORY — DX: Sleep apnea, unspecified: G47.30

## 2012-04-12 HISTORY — DX: Age-related osteoporosis without current pathological fracture: M81.0

## 2012-04-12 HISTORY — DX: Spondylosis without myelopathy or radiculopathy, cervical region: M47.812

## 2012-04-12 HISTORY — DX: Sciatica, unspecified side: M54.30

## 2012-04-12 HISTORY — DX: Unspecified osteoarthritis, unspecified site: M19.90

## 2012-04-12 HISTORY — DX: Gastro-esophageal reflux disease without esophagitis: K21.9

## 2012-04-12 LAB — BASIC METABOLIC PANEL
BUN: 17 mg/dL (ref 6–23)
Calcium: 9.8 mg/dL (ref 8.4–10.5)
Creatinine, Ser: 0.93 mg/dL (ref 0.50–1.10)
GFR calc non Af Amer: 59 mL/min — ABNORMAL LOW (ref >90)
Glucose, Bld: 67 mg/dL — ABNORMAL LOW (ref 70–99)
Sodium: 137 mEq/L (ref 135–145)

## 2012-04-12 LAB — CBC
Hemoglobin: 14.6 g/dL (ref 12.0–15.0)
MCH: 31.4 pg (ref 26.0–34.0)
MCHC: 33 g/dL (ref 30.0–36.0)
MCV: 95.1 fL (ref 78.0–100.0)

## 2012-04-12 LAB — SURGICAL PCR SCREEN: MRSA, PCR: NEGATIVE

## 2012-04-12 NOTE — Pre-Procedure Instructions (Signed)
PT'S CXR AND EKG REPORTS FROM 12/13/11 FROM DR. ARONSON'S OFFICE ARE ON PT'S CHART. CBC, BMET WERE DONE TODAY - PREOP - AT [facility].

## 2012-04-12 NOTE — Patient Instructions (Signed)
YOUR SURGERY IS SCHEDULED ON:  Tuesday  6/11  AT 11:00 AM  REPORT TO [facility] SHORT STAY CENTER AT:  9:00 AM      PHONE # FOR SHORT STAY IS [phone]  FLEETS ENEMA THE NIGHT BEFORE YOUR SURGERY.  DO NOT EAT OR DRINK ANYTHING AFTER MIDNIGHT THE NIGHT BEFORE YOUR SURGERY.  YOU MAY BRUSH YOUR TEETH, RINSE OUT YOUR MOUTH--BUT NO WATER, NO FOOD, NO CHEWING GUM, NO MINTS, NO CANDIES, NO CHEWING TOBACCO.  PLEASE TAKE THE FOLLOWING MEDICATIONS THE AM OF YOUR SURGERY WITH A FEW SIPS OF WATER:  MAY TAKE CITALOPRAM, GABAPENTIN, OMEPRAZOLE ( PRILOSEC) AND OXYCONTIN    IF YOU USE INHALERS--USE YOUR INHALERS THE AM OF YOUR SURGERY AND BRING INHALERS TO THE HOSPITAL -TAKE TO SURGERY.    IF YOU ARE DIABETIC:  DO NOT TAKE ANY DIABETIC MEDICATIONS THE AM OF YOUR SURGERY.  IF YOU TAKE INSULIN IN THE EVENINGS--PLEASE ONLY TAKE 1/2 NORMAL EVENING DOSE THE NIGHT BEFORE YOUR SURGERY.  NO INSULIN THE AM OF YOUR SURGERY.  IF YOU HAVE SLEEP APNEA AND USE CPAP OR BIPAP--PLEASE BRING THE MASK --NOT THE MACHINE-NOT THE TUBING   -JUST THE MASK. DO NOT BRING VALUABLES, MONEY, CREDIT CARDS.  CONTACT LENS, DENTURES / PARTIALS, GLASSES SHOULD NOT BE WORN TO SURGERY AND IN MOST CASES-HEARING AIDS WILL NEED TO BE REMOVED.  BRING YOUR GLASSES CASE, ANY EQUIPMENT NEEDED FOR YOUR CONTACT LENS. FOR PATIENTS ADMITTED TO THE HOSPITAL--CHECK OUT TIME THE DAY OF DISCHARGE IS 11:00 AM.  ALL INPATIENT ROOMS ARE PRIVATE - WITH BATHROOM, TELEPHONE, TELEVISION AND WIFI INTERNET. IF YOU ARE BEING DISCHARGED THE SAME DAY OF YOUR SURGERY--YOU CAN NOT DRIVE YOURSELF HOME--AND SHOULD NOT GO HOME ALONE BY TAXI OR BUS.  NO DRIVING OR OPERATING MACHINERY FOR 24 HOURS FOLLOWING ANESTHESIA / PAIN MEDICATIONS.                            SPECIAL INSTRUCTIONS:  CHLORHEXIDINE SOAP SHOWER (other brand names are Betasept and Hibiclens ) PLEASE SHOWER WITH CHLORHEXIDINE THE NIGHT BEFORE YOUR SURGERY AND THE AM OF YOUR SURGERY. DO NOT USE  CHLORHEXIDINE ON YOUR FACE OR PRIVATE AREAS--YOU MAY USE YOUR NORMAL SOAP THOSE AREAS AND YOUR NORMAL SHAMPOO.  WOMEN SHOULD AVOID SHAVING UNDER ARMS AND SHAVING LEGS 48 HOURS BEFORE USING CHLORHEXIDINE TO AVOID SKIN IRRITATION.  DO NOT USE IF ALLERGIC TO CHLORHEXIDINE.  PLEASE READ OVER ANY  FACT SHEETS THAT YOU WERE GIVEN: MRSA INFORMATION

## 2012-04-12 NOTE — Telephone Encounter (Signed)
Returned the patients call regarding her request to take one of her pain pills immediately after surgery. She is on oxycotin 20mg  q12 and will need to take it at that time she states. The patient is afraid of going into withdraws. Per Dr Daphine Deutscher the patient is able to take her oxycotin after her surgery. Patient verbalized understanding, she also requested a diet plan that she will need to follow after her nissen surgery, mailed a copy of "eating after your esophageal surgery" to the patient.

## 2012-04-17 ENCOUNTER — Ambulatory Visit (HOSPITAL_COMMUNITY): Payer: Medicare Other | Admitting: Certified Registered Nurse Anesthetist

## 2012-04-17 ENCOUNTER — Encounter (HOSPITAL_COMMUNITY): Payer: Self-pay | Admitting: *Deleted

## 2012-04-17 ENCOUNTER — Encounter (HOSPITAL_COMMUNITY)
Admission: RE | Disposition: A | Discharge status: Home Health | Payer: Self-pay | Source: Ambulatory Visit | Attending: Surgery

## 2012-04-17 ENCOUNTER — Inpatient Hospital Stay (HOSPITAL_COMMUNITY)
Admission: RE | Admit: 2012-04-17 | Discharge: 2012-04-19 | DRG: 328 | Disposition: A | Discharge status: Home Health | Payer: Medicare Other | Source: Ambulatory Visit | Attending: Surgery | Admitting: Surgery

## 2012-04-17 ENCOUNTER — Encounter (HOSPITAL_COMMUNITY): Payer: Self-pay | Admitting: Certified Registered Nurse Anesthetist

## 2012-04-17 DIAGNOSIS — K219 Gastro-esophageal reflux disease without esophagitis: Secondary | ICD-10-CM | POA: Diagnosis present

## 2012-04-17 DIAGNOSIS — F3289 Other specified depressive episodes: Secondary | ICD-10-CM | POA: Diagnosis not present

## 2012-04-17 DIAGNOSIS — Z01812 Encounter for preprocedural laboratory examination: Secondary | ICD-10-CM | POA: Diagnosis not present

## 2012-04-17 DIAGNOSIS — F411 Generalized anxiety disorder: Secondary | ICD-10-CM | POA: Diagnosis present

## 2012-04-17 DIAGNOSIS — K449 Diaphragmatic hernia without obstruction or gangrene: Secondary | ICD-10-CM

## 2012-04-17 DIAGNOSIS — I10 Essential (primary) hypertension: Secondary | ICD-10-CM | POA: Diagnosis present

## 2012-04-17 DIAGNOSIS — F329 Major depressive disorder, single episode, unspecified: Secondary | ICD-10-CM | POA: Diagnosis present

## 2012-04-17 DIAGNOSIS — G4730 Sleep apnea, unspecified: Secondary | ICD-10-CM | POA: Diagnosis present

## 2012-04-17 HISTORY — PX: HIATAL HERNIA REPAIR: SHX195

## 2012-04-17 LAB — CBC
Hemoglobin: 13.9 g/dL (ref 12.0–15.0)
MCH: 31.7 pg (ref 26.0–34.0)
MCHC: 33.9 g/dL (ref 30.0–36.0)
MCV: 93.4 fL (ref 78.0–100.0)
Platelets: 220 10*3/uL (ref 150–400)
RBC: 4.39 MIL/uL (ref 3.87–5.11)

## 2012-04-17 LAB — CREATININE, SERUM: Creatinine, Ser: 0.8 mg/dL (ref 0.50–1.10)

## 2012-04-17 SURGERY — REPAIR, HERNIA, HIATAL, LAPAROSCOPIC
Anesthesia: General | Site: Abdomen | Wound class: Clean Contaminated

## 2012-04-17 MED ORDER — HYDROMORPHONE HCL PF 1 MG/ML IJ SOLN
INTRAMUSCULAR | Status: DC | PRN
  Administered 2012-04-17 (×2): 0.5 mg via INTRAVENOUS

## 2012-04-17 MED ORDER — ACETAMINOPHEN 10 MG/ML IV SOLN
INTRAVENOUS | Status: AC
  Filled 2012-04-17: qty 100

## 2012-04-17 MED ORDER — HEPARIN SODIUM (PORCINE) 5000 UNIT/ML IJ SOLN
5000.0000 [IU] | Freq: Three times a day (TID) | INTRAMUSCULAR | Status: DC
  Administered 2012-04-18 – 2012-04-19 (×4): 5000 [IU] via SUBCUTANEOUS
  Filled 2012-04-17 (×9): qty 1

## 2012-04-17 MED ORDER — HEPARIN SODIUM (PORCINE) 5000 UNIT/ML IJ SOLN
5000.0000 [IU] | Freq: Once | INTRAMUSCULAR | Status: AC
  Administered 2012-04-17: 5000 [IU] via SUBCUTANEOUS

## 2012-04-17 MED ORDER — HYDROMORPHONE 0.3 MG/ML IV SOLN
INTRAVENOUS | Status: AC
  Filled 2012-04-17: qty 25

## 2012-04-17 MED ORDER — NEOSTIGMINE METHYLSULFATE 1 MG/ML IJ SOLN
INTRAMUSCULAR | Status: DC | PRN
  Administered 2012-04-17: 5 mg via INTRAVENOUS

## 2012-04-17 MED ORDER — DIPHENHYDRAMINE HCL 50 MG/ML IJ SOLN: 12.5000 mg | Freq: Four times a day (QID) | INTRAMUSCULAR | Status: DC | PRN

## 2012-04-17 MED ORDER — HYDROMORPHONE HCL PF 1 MG/ML IJ SOLN: 0.2500 mg | INTRAMUSCULAR | Status: DC | PRN

## 2012-04-17 MED ORDER — DEXAMETHASONE SODIUM PHOSPHATE 10 MG/ML IJ SOLN
INTRAMUSCULAR | Status: DC | PRN
  Administered 2012-04-17: 10 mg via INTRAVENOUS

## 2012-04-17 MED ORDER — SUCCINYLCHOLINE CHLORIDE 20 MG/ML IJ SOLN
INTRAMUSCULAR | Status: DC | PRN
  Administered 2012-04-17: 100 mg via INTRAVENOUS

## 2012-04-17 MED ORDER — ONDANSETRON HCL 4 MG/2ML IJ SOLN
4.0000 mg | INTRAMUSCULAR | Status: DC | PRN
  Administered 2012-04-18: 4 mg via INTRAVENOUS
  Filled 2012-04-17: qty 2

## 2012-04-17 MED ORDER — GLYCOPYRROLATE 0.2 MG/ML IJ SOLN
INTRAMUSCULAR | Status: DC | PRN
  Administered 2012-04-17: .8 mg via INTRAVENOUS

## 2012-04-17 MED ORDER — CIPROFLOXACIN IN D5W 400 MG/200ML IV SOLN
400.0000 mg | INTRAVENOUS | Status: AC
  Administered 2012-04-17: 400 mg via INTRAVENOUS

## 2012-04-17 MED ORDER — BUPIVACAINE LIPOSOME 1.3 % IJ SUSP
INTRAMUSCULAR | Status: DC | PRN
  Administered 2012-04-17: 20 mL

## 2012-04-17 MED ORDER — GABAPENTIN 300 MG PO CAPS
300.0000 mg | ORAL_CAPSULE | Freq: Three times a day (TID) | ORAL | Status: DC
  Administered 2012-04-18 – 2012-04-19 (×3): 300 mg via ORAL
  Filled 2012-04-17 (×7): qty 1

## 2012-04-17 MED ORDER — CIPROFLOXACIN IN D5W 400 MG/200ML IV SOLN
INTRAVENOUS | Status: AC
  Filled 2012-04-17: qty 200

## 2012-04-17 MED ORDER — FENTANYL CITRATE 0.05 MG/ML IJ SOLN
INTRAMUSCULAR | Status: DC | PRN
  Administered 2012-04-17 (×2): 50 ug via INTRAVENOUS
  Administered 2012-04-17: 100 ug via INTRAVENOUS
  Administered 2012-04-17: 50 ug via INTRAVENOUS
  Administered 2012-04-17: 100 ug via INTRAVENOUS

## 2012-04-17 MED ORDER — ONDANSETRON HCL 4 MG/2ML IJ SOLN: 4.0000 mg | Freq: Four times a day (QID) | INTRAMUSCULAR | Status: DC | PRN

## 2012-04-17 MED ORDER — ROCURONIUM BROMIDE 100 MG/10ML IV SOLN
INTRAVENOUS | Status: DC | PRN
  Administered 2012-04-17: 50 mg via INTRAVENOUS
  Administered 2012-04-17: 10 mg via INTRAVENOUS
  Administered 2012-04-17: 20 mg via INTRAVENOUS
  Administered 2012-04-17 (×3): 10 mg via INTRAVENOUS

## 2012-04-17 MED ORDER — LACTATED RINGERS IV SOLN
INTRAVENOUS | Status: DC
  Administered 2012-04-17: 1000 mL via INTRAVENOUS
  Administered 2012-04-17: 15:00:00 via INTRAVENOUS

## 2012-04-17 MED ORDER — LACTATED RINGERS IR SOLN
Status: DC | PRN
  Administered 2012-04-17: 1000 mL

## 2012-04-17 MED ORDER — HYDROMORPHONE 0.3 MG/ML IV SOLN
INTRAVENOUS | Status: DC
  Administered 2012-04-17 – 2012-04-18 (×2): via INTRAVENOUS
  Administered 2012-04-18: 0.9 mg via INTRAVENOUS
  Administered 2012-04-18: 0.6 mg via INTRAVENOUS
  Administered 2012-04-19: 1 mg via INTRAVENOUS
  Administered 2012-04-19: 0.9 mg via INTRAVENOUS
  Filled 2012-04-17: qty 25

## 2012-04-17 MED ORDER — BUPIVACAINE LIPOSOME 1.3 % IJ SUSP
20.0000 mL | Freq: Once | INTRAMUSCULAR | Status: DC
  Filled 2012-04-17: qty 20

## 2012-04-17 MED ORDER — UNJURY CHICKEN SOUP POWDER
2.0000 [oz_av] | Freq: Four times a day (QID) | ORAL | Status: DC
  Filled 2012-04-17 (×4): qty 27

## 2012-04-17 MED ORDER — LABETALOL HCL 5 MG/ML IV SOLN
INTRAVENOUS | Status: DC | PRN
  Administered 2012-04-17: 2.5 mg via INTRAVENOUS

## 2012-04-17 MED ORDER — UNJURY CHOCOLATE CLASSIC POWDER
2.0000 [oz_av] | Freq: Four times a day (QID) | ORAL | Status: DC
  Filled 2012-04-17 (×4): qty 27

## 2012-04-17 MED ORDER — HEPARIN SODIUM (PORCINE) 5000 UNIT/ML IJ SOLN
INTRAMUSCULAR | Status: AC
  Administered 2012-04-17: 5000 [IU] via SUBCUTANEOUS
  Filled 2012-04-17: qty 1

## 2012-04-17 MED ORDER — NALOXONE HCL 0.4 MG/ML IJ SOLN: 0.4000 mg | INTRAMUSCULAR | Status: DC | PRN

## 2012-04-17 MED ORDER — ACETAMINOPHEN 10 MG/ML IV SOLN
INTRAVENOUS | Status: DC | PRN
  Administered 2012-04-17: 1000 mg via INTRAVENOUS

## 2012-04-17 MED ORDER — VITAMINS A & D EX OINT
TOPICAL_OINTMENT | CUTANEOUS | Status: AC
  Filled 2012-04-17: qty 5

## 2012-04-17 MED ORDER — DIPHENHYDRAMINE HCL 12.5 MG/5ML PO ELIX: 12.5000 mg | ORAL_SOLUTION | Freq: Four times a day (QID) | ORAL | Status: DC | PRN

## 2012-04-17 MED ORDER — UNJURY VANILLA POWDER
2.0000 [oz_av] | Freq: Four times a day (QID) | ORAL | Status: DC
  Filled 2012-04-17 (×4): qty 27

## 2012-04-17 MED ORDER — KCL IN DEXTROSE-NACL 20-5-0.45 MEQ/L-%-% IV SOLN
INTRAVENOUS | Status: DC
  Administered 2012-04-17 – 2012-04-19 (×4): via INTRAVENOUS
  Filled 2012-04-17 (×7): qty 1000

## 2012-04-17 MED ORDER — PROPOFOL 10 MG/ML IV BOLUS
INTRAVENOUS | Status: DC | PRN
  Administered 2012-04-17: 150 mg via INTRAVENOUS

## 2012-04-17 MED ORDER — ONDANSETRON HCL 4 MG/2ML IJ SOLN
INTRAMUSCULAR | Status: DC | PRN
  Administered 2012-04-17: 4 mg via INTRAVENOUS

## 2012-04-17 MED ORDER — ESMOLOL HCL 10 MG/ML IV SOLN
INTRAVENOUS | Status: DC | PRN
  Administered 2012-04-17: 20 mg via INTRAVENOUS

## 2012-04-17 MED ORDER — EPHEDRINE SULFATE 50 MG/ML IJ SOLN
INTRAMUSCULAR | Status: DC | PRN
  Administered 2012-04-17: 5 mg via INTRAVENOUS
  Administered 2012-04-17: 10 mg via INTRAVENOUS
  Administered 2012-04-17: 5 mg via INTRAVENOUS

## 2012-04-17 MED ORDER — SODIUM CHLORIDE 0.9 % IJ SOLN: 9.0000 mL | INTRAMUSCULAR | Status: DC | PRN

## 2012-04-17 MED ORDER — SODIUM CHLORIDE 0.9 % IJ SOLN
INTRAMUSCULAR | Status: DC | PRN
  Administered 2012-04-17: 20 mL

## 2012-04-17 SURGICAL SUPPLY — 65 items
ADH SKN CLS APL DERMABOND .7 (GAUZE/BANDAGES/DRESSINGS) ×1
APL SKNCLS STERI-STRIP NONHPOA (GAUZE/BANDAGES/DRESSINGS) ×1
APPLIER CLIP ROT 10 11.4 M/L (STAPLE)
APR CLP MED LRG 11.4X10 (STAPLE)
BENZOIN TINCTURE PRP APPL 2/3 (GAUZE/BANDAGES/DRESSINGS) ×2 IMPLANT
CABLE HIGH FREQUENCY MONO STRZ (ELECTRODE) IMPLANT
CANISTER SUCTION 2500CC (MISCELLANEOUS) ×1 IMPLANT
CLAMP ENDO BABCK 10MM (STAPLE) IMPLANT
CLIP APPLIE ROT 10 11.4 M/L (STAPLE) IMPLANT
CLOTH BEACON ORANGE TIMEOUT ST (SAFETY) ×2 IMPLANT
COVER SURGICAL LIGHT HANDLE (MISCELLANEOUS) ×2 IMPLANT
DECANTER SPIKE VIAL GLASS SM (MISCELLANEOUS) ×2 IMPLANT
DERMABOND ADVANCED (GAUZE/BANDAGES/DRESSINGS) ×1
DERMABOND ADVANCED .7 DNX12 (GAUZE/BANDAGES/DRESSINGS) IMPLANT
DEVICE SUT QUICK LOAD TK 5 (STAPLE) ×12 IMPLANT
DEVICE SUT TI-KNOT TK 5X26 (MISCELLANEOUS) ×1 IMPLANT
DEVICE SUTURE ENDOST 10MM (ENDOMECHANICALS) ×2 IMPLANT
DEVICE TROCAR PUNCTURE CLOSURE (ENDOMECHANICALS) ×1 IMPLANT
DISSECTOR BLUNT TIP ENDO 5MM (MISCELLANEOUS) ×2 IMPLANT
DRAIN PENROSE 18X1/2 LTX STRL (DRAIN) ×2 IMPLANT
DRAPE LAPAROSCOPIC ABDOMINAL (DRAPES) ×2 IMPLANT
ELECT REM PT RETURN 9FT ADLT (ELECTROSURGICAL) ×2
ELECTRODE REM PT RTRN 9FT ADLT (ELECTROSURGICAL) ×1 IMPLANT
FELT TEFLON 4 X1 (Mesh General) ×2 IMPLANT
FILTER SMOKE EVAC LAPAROSHD (FILTER) IMPLANT
GLOVE BIOGEL M 8.0 STRL (GLOVE) ×2 IMPLANT
GLOVE BIOGEL PI IND STRL 7.0 (GLOVE) IMPLANT
GLOVE BIOGEL PI INDICATOR 7.0 (GLOVE)
GOWN STRL NON-REIN LRG LVL3 (GOWN DISPOSABLE) ×7 IMPLANT
GOWN STRL REIN XL XLG (GOWN DISPOSABLE) ×5 IMPLANT
GRASPER ENDO BABCOCK 10 (MISCELLANEOUS) IMPLANT
GRASPER ENDO BABCOCK 10MM (MISCELLANEOUS)
HAND ACTIVATED (MISCELLANEOUS) ×2 IMPLANT
KIT BASIN OR (CUSTOM PROCEDURE TRAY) ×2 IMPLANT
MESH HERNIA 7X10 (Mesh General) ×1 IMPLANT
NS IRRIG 1000ML POUR BTL (IV SOLUTION) ×2 IMPLANT
PENCIL BUTTON HOLSTER BLD 10FT (ELECTRODE) IMPLANT
SCISSORS LAP 5X35 DISP (ENDOMECHANICALS) ×2 IMPLANT
SET IRRIG TUBING LAPAROSCOPIC (IRRIGATION / IRRIGATOR) ×2 IMPLANT
SLEEVE ADV FIXATION 5X100MM (TROCAR) IMPLANT
SLEEVE Z-THREAD 5X100MM (TROCAR) IMPLANT
SOLUTION ANTI FOG 6CC (MISCELLANEOUS) ×2 IMPLANT
STAPLER VISISTAT 35W (STAPLE) ×2 IMPLANT
STRIP CLOSURE SKIN 1/2X4 (GAUZE/BANDAGES/DRESSINGS) IMPLANT
SUT ETHIBOND 2 0 SH (SUTURE) ×16
SUT ETHIBOND 2 0 SH 36X2 (SUTURE) IMPLANT
SUT PROLENE 2 0 CT2 30 (SUTURE) ×4 IMPLANT
SUT PROLENE 2 0 KS (SUTURE) ×2 IMPLANT
SUT SURGIDAC NAB ES-9 0 48 120 (SUTURE) ×8 IMPLANT
SUT VIC AB 4-0 SH 18 (SUTURE) ×2 IMPLANT
SYR 30ML LL (SYRINGE) ×2 IMPLANT
TIP INNERVISION DETACH 40FR (MISCELLANEOUS) IMPLANT
TIP INNERVISION DETACH 50FR (MISCELLANEOUS) IMPLANT
TIP INNERVISION DETACH 56FR (MISCELLANEOUS) IMPLANT
TIPS INNERVISION DETACH 40FR (MISCELLANEOUS)
TRAY FOLEY CATH 14FRSI W/METER (CATHETERS) ×2 IMPLANT
TRAY LAP CHOLE (CUSTOM PROCEDURE TRAY) ×2 IMPLANT
TROCAR ADV FIXATION 11X100MM (TROCAR) IMPLANT
TROCAR ADV FIXATION 5X100MM (TROCAR) IMPLANT
TROCAR XCEL BLUNT TIP 100MML (ENDOMECHANICALS) IMPLANT
TROCAR XCEL NON-BLD 11X100MML (ENDOMECHANICALS) IMPLANT
TROCAR Z-THREAD FIOS 11X100 BL (TROCAR) ×2 IMPLANT
TROCAR Z-THREAD FIOS 5X100MM (TROCAR) ×2 IMPLANT
TROCAR Z-THREAD SLEEVE 11X100 (TROCAR) IMPLANT
TUBING FILTER THERMOFLATOR (ELECTROSURGICAL) ×2 IMPLANT

## 2012-04-17 NOTE — Op Note (Signed)
Surgeon: Wenda Low, MD, FACS  Asst:  Ovidio Kin, M.D. FACS  Anes:  General  Procedure: Laparoscopic takedown of complex type III hiatal hernia with most all of the stomach in the chest, closure of diaphragm and repair with Cook collagen mesh, gastropexy  Diagnosis: Large type III mixed hiatal hernia  Complications: None  EBL:   8 cc  Description of Procedure:  Patient was brought to room 1 and given general anesthesia. The abdomen was prepped with PCMX and draped sterilely. Timeout performed. Abdomen was entered using a 0 Optiview 5 mm through the left upper quadrant without difficulty. All 5 mm trochars were used except for T11 slowly to the right of the umbilicus inferiorly on the right. Angled 5 mm scope was used. Nathanson retractor was inserted foregut dissection began with contrast the stomach. The only portion the stomach was visible was the pylorus and duodenum with the bulk of the stomach was in the chest in its entirety. This was grasped and initially I took down the cardia was was at the upper most portion of this hernia sac. I then worked on both right and left sides clearing up the crura and the attachments the stomach and these areas. Because of the size of the defect and because we were having difficulty getting the esophagus to length because of some foreshortening elected to get the stomach down straightening out and felt a Nissen fundoplication was not in order. A put a Penrose drain around the portion of the higher portion of the stomach for retraction. I cleaned off the attachments and the sac for the most part. Some remnants of sac stayed up within the chest.  Crura and the proximal approximated with pledgeted sutures using approximately 4 posteriorly and one anterior. Also incorporated a relaxing incision through the right crus to help with this closure. Following this I placed a piece of Cook biologic mesh and sutured the waist along the patient's right side so that was  buttressing the relaxing incision and then got complete 360 coverage with the mesh around the defect. Following this the body of the stomach was pexed anteriorly using 2 sutures of 2-0 Prolene past through the stomach and then with the Endo Close were brought up to be tied to the intra-abdominal wall again to prevent stomach cephalad migration. Exparel was injected in all the port sites as well as in the gastropexy site. The wounds and closed 4-0 Vicryl and Dermabond. Sponge and needle counts reported as correct  Matt B. Daphine Deutscher, MD, [facility], [place] [phone]

## 2012-04-17 NOTE — Preoperative (Addendum)
Beta Blockers   Reason not to administer Beta Blockers:Not Applicable 

## 2012-04-17 NOTE — Anesthesia Preprocedure Evaluation (Signed)
Anesthesia Evaluation  Patient identified by MRN, date of birth, ID band Patient awake    Reviewed: Allergy & Precautions, H&P , NPO status , Patient's Chart, lab work & pertinent test results  Airway Mallampati: II TM Distance: >3 FB Neck ROM: Full    Dental No notable dental hx.    Pulmonary sleep apnea ,  breath sounds clear to auscultation  Pulmonary exam normal       Cardiovascular hypertension, Pt. on medications negative cardio ROS  Rhythm:Regular Rate:Normal     Neuro/Psych PSYCHIATRIC DISORDERS Anxiety Depression  Neuromuscular disease    GI/Hepatic Neg liver ROS, hiatal hernia, GERD-  Medicated,  Endo/Other  negative endocrine ROS  Renal/GU negative Renal ROS  negative genitourinary   Musculoskeletal negative musculoskeletal ROS (+)   Abdominal   Peds negative pediatric ROS (+)  Hematology negative hematology ROS (+)   Anesthesia Other Findings   Reproductive/Obstetrics negative OB ROS                           Anesthesia Physical Anesthesia Plan  ASA: II  Anesthesia Plan: General   Post-op Pain Management:    Induction: Intravenous  Airway Management Planned: Oral ETT  Additional Equipment:   Intra-op Plan:   Post-operative Plan: Extubation in OR  Informed Consent: I have reviewed the patients History and Physical, chart, labs and discussed the procedure including the risks, benefits and alternatives for the proposed anesthesia with the patient or authorized representative who has indicated his/her understanding and acceptance.   Dental advisory given  Plan Discussed with: CRNA  Anesthesia Plan Comments:         Anesthesia Quick Evaluation

## 2012-04-17 NOTE — Anesthesia Postprocedure Evaluation (Signed)
  Anesthesia Post-op Note  Patient: Stacy Parker  Procedure(s) Performed: Procedure(s) (LRB): LAPAROSCOPIC REPAIR OF HIATAL HERNIA (N/A) INSERTION OF MESH (N/A)  Patient Location: PACU  Anesthesia Type: General  Level of Consciousness: oriented and sedated  Airway and Oxygen Therapy: Patient Spontanous Breathing and Patient connected to nasal cannula oxygen  Post-op Pain: mild  Post-op Assessment: Post-op Vital signs reviewed, Patient's Cardiovascular Status Stable, Respiratory Function Stable and Patent Airway  Post-op Vital Signs: stable  Complications: No apparent anesthesia complications

## 2012-04-17 NOTE — Progress Notes (Deleted)
Pt c/o pain in left heel and right toe. No pain meds ordered prn. Mid level paged awaiting call back.

## 2012-04-17 NOTE — H&P (Signed)
Chief Complaint: Large type III mixed hiatus hernia that is symptomatic  History of Present Illness: Stacy Parker is an 78 y.o. female followed by Yancey Flemings has recently gotten more symptomatic with a large hiatus hernia. I reveiwed her disk of a CT at [facility] and the UGI series that I ordered. I discussed lap repair of his large hiatus hernia in detail and gave her the booklet on the procedure. I worked her appt in during [facility]. She has a complicated medical history including osteopenia and is followed by Dr. Othelia Pulling.  Past Medical History   Diagnosis  Date   .  PMB (postmenopausal bleeding)    .  Osteopenia    .  Renal artery stenosis    .  Reflux    .  Sciatic nerve disease    .  Depression    .  Anxiety    .  Insomnia    .  HTN (hypertension)    .  Basal cell carcinoma    .  Squamous carcinoma    .  Diverticulosis     Past Surgical History   Procedure  Date   .  Total vaginal hysterectomy  1993   .  Tubal ligation    .  Total hip arthroplasty      left   .  Breast surgery      Fibroma, right   .  Intraoperative arteriogram    .  Verteboplasty    .  Spinal fusion      8 level   .  Neuro stimulator     Current Outpatient Prescriptions   Medication  Sig  Dispense  Refill   .  calcium citrate-vitamin D 200-200 MG-UNIT TABS  Take 1 tablet by mouth daily.     .  citalopram (CELEXA) 20 MG tablet  Take 20 mg by mouth 2 (two) times daily.     Tery Sanfilippo Sodium (DOC-Q-LACE PO)  Take 1 tablet by mouth daily.     Marland Kitchen  gabapentin (NEURONTIN) 300 MG capsule  Take 300 mg by mouth 3 (three) times daily.     Marland Kitchen  losartan-hydrochlorothiazide (HYZAAR) 50-12.5 MG per tablet  Take 1 tablet by mouth daily.     .  Multiple Vitamin (MULTIVITAMIN) tablet  Take 1 tablet by mouth daily.     Marland Kitchen  omeprazole (PRILOSEC) 20 MG capsule  Take 20 mg by mouth daily.     Marland Kitchen  oxyCODONE (OXYCONTIN) 20 MG 12 hr tablet  Take 20 mg by mouth every 12 (twelve) hours.     Marland Kitchen  oxyCODONE-acetaminophen  (PERCOCET) 5-325 MG per tablet  Take 1 tablet by mouth every 4 (four) hours as needed.     .  Probiotic Product (ALIGN PO)  Take by mouth.     .  quiNIDine sulfate 300 MG tablet  Take 300 mg by mouth every 6 (six) hours.     .  temazepam (RESTORIL) 30 MG capsule  Take 1 capsule (30 mg total) by mouth at bedtime as needed for sleep.  90 capsule  1   .  temazepam (RESTORIL) 30 MG capsule  Take 15 mg by mouth at bedtime as needed.      Boniva; Ceftin; Cymbalta; Dicloxacillin; Doxycycline; Metronidazole; and Promethazine hcl  Family History   Problem  Relation  Age of Onset   .  Hypertension  Mother    .  COPD  Mother    .  Heart disease  Father    .  Hypertension  Father    .  Breast cancer  Maternal Grandmother    .  Skin cancer  Father     Social History: reports that she has quit smoking. Her smoking use included Cigarettes. She has never used smokeless tobacco. She reports that she drinks alcohol. She reports that she does not use illicit drugs.  REVIEW OF SYSTEMS - PERTINENT POSITIVES ONLY:  Left leg DVT after her back surgery at [facility])  Physical Exam:  Blood pressure 118/72, pulse 68, temperature 97.6 F (36.4 C), temperature source Temporal, resp. rate 18, height 5\' 3"  (1.6 m), weight 135 lb (61.236 kg).  Body mass index is 23.91 kg/(m^2).  Gen: WDWN WF NAD  Neurological: Alert and oriented to person, place, and time. Motor and sensory function is grossly intact  Head: Normocephalic and atraumatic.  Eyes: Conjunctivae are normal. Pupils are equal, round, and reactive to light. No scleral icterus.  Neck: Normal range of motion. Neck supple. No tracheal deviation or thyromegaly present.  Cardiovascular: SR without murmurs or gallops. No carotid bruits  Respiratory: Effort normal. No respiratory distress. No chest wall tenderness. Breath sounds normal. No wheezes, rales or rhonchi.  Abdomen: Flat, nontender  Significant back issues with nerve stimulator and chronic  oxycontin administration.  LABORATORY RESULTS:  No results found for this or any previous visit (from the past 48 hour(s)).  RADIOLOGY RESULTS:  Dg Ugi W/high Density W/kub  03/26/2012 *RADIOLOGY REPORT* Clinical Data: Hiatal hernia. UPPER GI SERIES W/HIGH DENSITY W/KUB Technique: Double contrast upper GI was performed. Comparison: 01/05/2012 Findings: Scout KUB demonstrates prominence of stool in the colon. The dorsal column stimulator projects over the right abdomen, and there is a left total hip implant left total hip prosthesis in addition to posterolateral rod pedicle screws extending from T12 through the sacrum. Laryngeal penetration noted during the pharyngeal phase of swallowing. Cervical spondylosis noted with grade 1 anterolisthesis of C4 and C5, and considerable loss of disc height that C5-6 and C6-7. Primary peristaltic waves of the esophagus were normal on 3/4 swallows. No esophageal stricture or significant distal esophageal fold thickening is observed. The patient has mobility difficulties due to the spinal hardware, back pain, and dorsal column stimulator. Accordingly, gastric images were obtained primarily in the oblique supine positions rather than oblique prone positions. A large hiatal hernia is noted, with gastric rotation such that the greater curvature bases the upper left and the lesser curvature of the lower right of the patient. Nearly the entire stomach is herniated, with the duodenal bulb just below the hemidiaphragm. The ligament of Treitz is felt to be in the proper position. No gastric ulcer or gastric masses observed. IMPRESSION: 1. Large hiatal hernia containing essentially the entire stomach. Compared to the CT scan from 10/26/2009, a greater amount of the stomach is above the left hemidiaphragm, with organoaxial rotation of the stomach. The esophagus does not extend below the hemidiaphragm (type 1 hernia). 2. Prominence of stool throughout the colon suggests constipation. 3.  Cervical spondylosis. 3. Laryngeal penetration noted during the pharyngeal phase of swallowing. Original Report Authenticated By: Dellia Cloud, M.D.   Problem List:  Patient Active Problem List   Diagnoses   .  COLONIC POLYPS, ADENOMATOUS   .  ESOPHAGEAL STRICTURE   .  GERD   .  HIATAL HERNIA   .  DIVERTICULOSIS, COLON   .  RECTAL BLEEDING   .  OTHER SPECIFIED DISORDER OF INTESTINES   .  WEIGHT LOSS-ABNORMAL   .  DIARRHEA   .  PERSONAL HX COLONIC POLYPS   .  PMB (postmenopausal bleeding)   .  Osteopenia    Assessment & Plan:  Large type III mixed hiatus hernia that is symptomatic. Will arrange surgery at [facility] .  Matt B. Daphine Deutscher, MD, [facility]  [phone] beeper  [phone] There has been no change in the patient's past medical history or physical exam in the past 24 hours to the best of my knowledge. I examined the patient in the holding area and have made any changes to the history and physical exam report that is included above.   Expectations and outcome results have been discussed with the patient to include risks and benefits.  All questions have been answered and we will proceed with previously discussed procedure noted and signed in the consent form in the patient's record.    Madelyne Millikan BMD FACS 10:57 AM  04/17/2012

## 2012-04-17 NOTE — Progress Notes (Signed)
Pt has an implanted nerve stimulator right back hip that pt has "turned off " at this time using her magnet. Pt also took fleets enema last night and current has disposable underwear on because she is still having some results from this

## 2012-04-17 NOTE — Transfer of Care (Signed)
Immediate Anesthesia Transfer of Care Note  Patient: Stacy Parker  Procedure(s) Performed: Procedure(s) (LRB): LAPAROSCOPIC REPAIR OF HIATAL HERNIA (N/A) INSERTION OF MESH (N/A)  Patient Location: PACU  Anesthesia Type: General  Level of Consciousness: awake, oriented and patient cooperative  Airway & Oxygen Therapy: Patient Spontanous Breathing and Patient connected to face mask oxygen  Post-op Assessment: Report given to PACU RN, Post -op Vital signs reviewed and stable and Patient moving all extremities  Post vital signs: Reviewed and stable  Complications: No apparent anesthesia complications

## 2012-04-18 ENCOUNTER — Encounter (HOSPITAL_COMMUNITY): Payer: Self-pay | Admitting: Surgery

## 2012-04-18 ENCOUNTER — Inpatient Hospital Stay (HOSPITAL_COMMUNITY): Payer: Medicare Other

## 2012-04-18 LAB — CBC
HCT: 39.9 % (ref 36.0–46.0)
MCH: 31.7 pg (ref 26.0–34.0)
MCV: 93.7 fL (ref 78.0–100.0)
RBC: 4.26 MIL/uL (ref 3.87–5.11)
WBC: 12.6 10*3/uL — ABNORMAL HIGH (ref 4.0–10.5)

## 2012-04-18 LAB — DIFFERENTIAL
Eosinophils Absolute: 0 10*3/uL (ref 0.0–0.7)
Eosinophils Relative: 0 % (ref 0–5)
Lymphocytes Relative: 14 % (ref 12–46)
Lymphs Abs: 1.8 10*3/uL (ref 0.7–4.0)
Monocytes Absolute: 1.7 10*3/uL — ABNORMAL HIGH (ref 0.1–1.0)

## 2012-04-18 MED ORDER — VITAMINS A & D EX OINT
TOPICAL_OINTMENT | CUTANEOUS | Status: AC
  Administered 2012-04-18: 13:00:00
  Filled 2012-04-18: qty 5

## 2012-04-18 MED ORDER — TEMAZEPAM 15 MG PO CAPS
15.0000 mg | ORAL_CAPSULE | Freq: Every evening | ORAL | Status: DC | PRN
  Administered 2012-04-18 – 2012-04-19 (×2): 15 mg via ORAL
  Filled 2012-04-18 (×2): qty 1

## 2012-04-18 MED ORDER — CITALOPRAM HYDROBROMIDE 20 MG PO TABS
20.0000 mg | ORAL_TABLET | Freq: Two times a day (BID) | ORAL | Status: DC
  Administered 2012-04-18 – 2012-04-19 (×2): 20 mg via ORAL
  Filled 2012-04-18 (×3): qty 1

## 2012-04-18 NOTE — Progress Notes (Signed)
Utilization review completed.  

## 2012-04-19 ENCOUNTER — Ambulatory Visit (INDEPENDENT_AMBULATORY_CARE_PROVIDER_SITE_OTHER): Payer: Self-pay | Admitting: Surgery

## 2012-04-19 LAB — DIFFERENTIAL
Basophils Absolute: 0 10*3/uL (ref 0.0–0.1)
Basophils Relative: 0 % (ref 0–1)
Eosinophils Absolute: 0 10*3/uL (ref 0.0–0.7)
Eosinophils Relative: 0 % (ref 0–5)
Monocytes Absolute: 2 10*3/uL — ABNORMAL HIGH (ref 0.1–1.0)

## 2012-04-19 LAB — CBC
HCT: 37.4 % (ref 36.0–46.0)
MCH: 31.6 pg (ref 26.0–34.0)
MCHC: 33.4 g/dL (ref 30.0–36.0)
MCV: 94.7 fL (ref 78.0–100.0)
Platelets: 182 10*3/uL (ref 150–400)
RDW: 12.7 % (ref 11.5–15.5)

## 2012-04-19 MED ORDER — OXYCODONE HCL 20 MG PO TB12
20.0000 mg | ORAL_TABLET | Freq: Two times a day (BID) | ORAL | Status: DC
  Administered 2012-04-19: 20 mg via ORAL
  Filled 2012-04-19: qty 1

## 2012-04-19 NOTE — Discharge Summary (Signed)
Physician Discharge Summary  Patient ID: Stacy Parker MRN: [id number] DOB/AGE: 1934-04-25 78 y.o.  Admit date: 04/17/2012 Discharge date: 04/19/2012  Admission Diagnoses:  Giant type III hiatus hernia containing most of stomach  Discharge Diagnoses:  Same, post repair  Active Problems:  * No active hospital problems. *    Surgery:  Lap takedown of hiatus hernia with gastropexy.    Discharged Condition: improved  Hospital Course:   Had surgery and taken to unit for observation.  UGI showed no leak.  Diet advanced and returned to po meds  Consults: none  Significant Diagnostic Studies: UGI    Discharge Exam: Blood pressure 137/70, pulse 90, temperature 98.1 F (36.7 C), temperature source Oral, resp. rate 16, height 5\' 3"  (1.6 m), weight 142 lb 6.7 oz (64.6 kg), SpO2 98.00%. Minimal tenderness.  Wants to go home later today  Disposition: 06-Home-Health Care Svc  Discharge Orders    Future Appointments: Provider: Department: Dept Phone: Center:   05/04/2012 1:30 PM Valarie Merino, MD [facility] [phone] None     Future Orders Please Complete By Expires   Diet - low sodium heart healthy      Increase activity slowly      Discharge instructions      Comments:   Full liquid diet for the next 5 days then advance to pureed diet for two weeks and then restart regular diet   No dressing needed        Medication List  As of 04/19/2012  8:33 AM   STOP taking these medications         temazepam 30 MG capsule         TAKE these medications         ALIGN PO   Take by mouth. ONE DAILY      aspirin EC 81 MG tablet   Take 81 mg by mouth See admin instructions. Takes 4 times per week.[name]      Biotin 5000 MCG Caps   Take 1 capsule by mouth daily.      Calcium-Magnesium-Zinc 167-83-8 MG Tabs   Take 1 tablet by mouth daily.      cholecalciferol 1000 UNITS tablet   Commonly known as: VITAMIN D   Take 1,000 Units by mouth  daily.      citalopram 20 MG tablet   Commonly known as: CELEXA   Take 20 mg by mouth 2 (two) times daily.      DOC-Q-LACE PO   Take 1 tablet by mouth 2 (two) times daily.      gabapentin 300 MG capsule   Commonly known as: NEURONTIN   Take 300 mg by mouth 3 (three) times daily.      losartan-hydrochlorothiazide 50-12.5 MG per tablet   Commonly known as: HYZAAR   Take 1 tablet by mouth 2 (two) times daily.      methocarbamol 500 MG tablet   Commonly known as: ROBAXIN   Take 500 mg by mouth 4 (four) times daily. PT HAS- BUT NEVER USES--IT IS FOR BACK SPASMS      multivitamin tablet   Take 1 tablet by mouth daily.      OMEGA 3 1200 MG Caps   Take 1 capsule by mouth 2 (two) times daily.      omeprazole 20 MG capsule   Commonly known as: PRILOSEC   Take 20 mg by mouth See admin instructions. Monday Wednesday Friday and saturday      OVER THE COUNTER MEDICATION  Take 1 tablet by mouth daily. Calcium 500mg ,magnesium80mg ,250 vitamin d      OVER THE COUNTER MEDICATION   QUININE SULFATE  300 MG PO   ONE ON SUN, TUES, THURS, SAT - PT STATES RX THAT SHE OBTAINS FROM [place]  FOR LEG CRAMPS      oxyCODONE 20 MG 12 hr tablet   Commonly known as: OXYCONTIN   Take 20 mg by mouth 3 (three) times daily.      oxyCODONE-acetaminophen 5-325 MG per tablet   Commonly known as: PERCOCET   Take 1 tablet by mouth every 4 (four) hours as needed. BREAK THRU PAIN--PT HAS - BUT NEVER USES      quiNIDine sulfate 300 MG tablet   Take 300 mg by mouth daily.      RESTORIL 30 MG capsule   Generic drug: temazepam   Take 15 mg by mouth at bedtime as needed.      temazepam 15 MG capsule   Commonly known as: RESTORIL   Take 15 mg by mouth at bedtime.      vitamin C 500 MG tablet   Commonly known as: ASCORBIC ACID   Take 500 mg by mouth daily.           Follow-up Information    Follow up with Sheldon Amara B, MD. Call in 4 weeks.   Contact information:   [employer]  [place] [address] [address] [phone]          Signed: Valarie Merino 04/19/2012, 8:33 AM

## 2012-04-19 NOTE — Discharge Instructions (Signed)
Stay on full liquids for the next 5 days then transition to pureed diet for a couple of weeks.  As you advance to regular diet, chew food completely.

## 2012-04-20 ENCOUNTER — Telehealth (INDEPENDENT_AMBULATORY_CARE_PROVIDER_SITE_OTHER): Payer: Self-pay | Admitting: General Surgery

## 2012-04-20 NOTE — Telephone Encounter (Signed)
Pt calling to ask about travelling approx 100 miles in a car.  She will not be the driver.  Explained she must wear her seatbelt and should stop to walk around for 15-20 minutes every hour.  Since she takes narcotics she absolutely cannot drive.  She understands and will comply.

## 2012-04-25 ENCOUNTER — Telehealth (INDEPENDENT_AMBULATORY_CARE_PROVIDER_SITE_OTHER): Payer: Self-pay | Admitting: General Surgery

## 2012-04-25 NOTE — Telephone Encounter (Signed)
Pt calling to ask if it is OK for her to be home alone now.  She is 1 week post op now and doing well/ self-sufficient.  Advised that if she is confident to be home alone, then she is far enough out from surgery to be safe.

## 2012-04-26 ENCOUNTER — Telehealth (INDEPENDENT_AMBULATORY_CARE_PROVIDER_SITE_OTHER): Payer: Self-pay

## 2012-04-26 NOTE — Telephone Encounter (Signed)
Patient calling to report nightly increase in reflux symptoms, patient would like to know if she should continue taking Prilosec or if there's any other recommendations we can suggest.  (s/p lap hiatal hernia 04/17/12--Dr. Daphine Deutscher pm off)

## 2012-04-30 DIAGNOSIS — G894 Chronic pain syndrome: Secondary | ICD-10-CM | POA: Diagnosis not present

## 2012-04-30 DIAGNOSIS — M961 Postlaminectomy syndrome, not elsewhere classified: Secondary | ICD-10-CM | POA: Diagnosis not present

## 2012-05-04 ENCOUNTER — Ambulatory Visit (INDEPENDENT_AMBULATORY_CARE_PROVIDER_SITE_OTHER): Payer: Medicare Other | Admitting: Surgery

## 2012-05-04 DIAGNOSIS — Z9889 Other specified postprocedural states: Secondary | ICD-10-CM | POA: Insufficient documentation

## 2012-05-04 DIAGNOSIS — Z09 Encounter for follow-up examination after completed treatment for conditions other than malignant neoplasm: Secondary | ICD-10-CM

## 2012-05-04 MED ORDER — PANTOPRAZOLE SODIUM 40 MG PO TBEC: 40.0000 mg | DELAYED_RELEASE_TABLET | Freq: Every day | ORAL | Status: DC

## 2012-05-04 NOTE — Progress Notes (Signed)
Stacy Parker 78 y.o.  There is no height or weight on file to calculate BMI.  Patient Active Problem List  Diagnosis  . COLONIC POLYPS, ADENOMATOUS  . ESOPHAGEAL STRICTURE  . GERD  . HIATAL HERNIA  . DIVERTICULOSIS, COLON  . PERSONAL HX COLONIC POLYPS  . PMB (postmenopausal bleeding)  . Osteopenia    Allergies  Allergen Reactions  . Boniva (Ibandronic Acid) Other (See Comments)    Flu like symptoms STATES ALL THE OSTEOPOROSIS DRUGS HAVE CAUSED FLU LIKE SYMPTOMS   . Ceftin Other (See Comments)    unknown  . Cymbalta (Duloxetine Hcl) Nausea Only  . Dicloxacillin Diarrhea  . Doxycycline Other (See Comments)    unknown  . Metronidazole Nausea And Vomiting  . Promethazine Hcl     REACTION: Restless leg?    Past Surgical History  Procedure Date  . Total vaginal hysterectomy 1993  . Tubal ligation   . Total hip arthroplasty     left  . Breast surgery     Fibroma, right  . Intraoperative arteriogram   . Verteboplasty   . Spinal fusion     8 level  LUMBAR/THORACIC  . Neuro stimulator     THORACIC AREA-BATTERY IN RIGHT HIP PT TURNS OFF AND ON WITH MAGNET-PT STATES SHE IS PAIN FREE WHEN LYING DOWN -BUT WHEN SITTING OR STANDING SHE EXPERIENCES TINGLING AND PAIN DOWN RIGHT LEG-SCIATIC PAIN  . Hiatal hernia repair 04/17/2012    Procedure: LAPAROSCOPIC REPAIR OF HIATAL HERNIA;  Surgeon: Valarie Merino, MD;  Location: [facility];  Service: General;  Laterality: N/A;  Large Hiatus Hernia   ARONSON,RICHARD A, MD No diagnosis found.  First postop visit.  She is 2 weeks post reduction of a giant type III mixed hiatal hernia where her entire stomach was up her chest. I was able to reduce that and mobilize her esophagus somewhat and close her diaphragm partially but the diaphragmatic defect was too large. I did a gastropexy and straightened her stomach. She will now have some reflux and start her on protonic 40 mg a day for this.  I answered her many questions about her surgery about  what she can expect. Her incisions have healed nicely. I plan to see her again in 2 months. I wrote a prescription for protonic history cylinder40 mg q.d. with 3 refills #60. Matt B. Daphine Deutscher, MD, [facility] [phone] beeper [phone]  05/04/2012 1:37 PM

## 2012-05-04 NOTE — Patient Instructions (Signed)
Follow the soft diet for another 2 weeks and then advance diet as tolerated.

## 2012-05-17 DIAGNOSIS — Z1231 Encounter for screening mammogram for malignant neoplasm of breast: Secondary | ICD-10-CM | POA: Diagnosis not present

## 2012-05-22 ENCOUNTER — Other Ambulatory Visit: Payer: Self-pay | Admitting: *Deleted

## 2012-05-22 DIAGNOSIS — R928 Other abnormal and inconclusive findings on diagnostic imaging of breast: Secondary | ICD-10-CM | POA: Diagnosis not present

## 2012-05-23 ENCOUNTER — Other Ambulatory Visit: Payer: Self-pay | Admitting: Obstetrics and Gynecology

## 2012-05-23 DIAGNOSIS — R928 Other abnormal and inconclusive findings on diagnostic imaging of breast: Secondary | ICD-10-CM

## 2012-05-24 ENCOUNTER — Other Ambulatory Visit: Payer: Self-pay | Admitting: *Deleted

## 2012-05-24 ENCOUNTER — Encounter: Payer: Self-pay | Admitting: Obstetrics and Gynecology

## 2012-05-24 DIAGNOSIS — N6459 Other signs and symptoms in breast: Secondary | ICD-10-CM

## 2012-05-28 ENCOUNTER — Other Ambulatory Visit: Payer: Self-pay | Admitting: Obstetrics and Gynecology

## 2012-05-28 ENCOUNTER — Other Ambulatory Visit: Payer: Self-pay | Admitting: Radiology

## 2012-05-28 DIAGNOSIS — R920 Mammographic microcalcification found on diagnostic imaging of breast: Secondary | ICD-10-CM | POA: Diagnosis not present

## 2012-05-28 DIAGNOSIS — N6459 Other signs and symptoms in breast: Secondary | ICD-10-CM

## 2012-05-28 DIAGNOSIS — N6029 Fibroadenosis of unspecified breast: Secondary | ICD-10-CM | POA: Diagnosis not present

## 2012-05-28 DIAGNOSIS — Z0189 Encounter for other specified special examinations: Secondary | ICD-10-CM | POA: Diagnosis not present

## 2012-05-31 DIAGNOSIS — N644 Mastodynia: Secondary | ICD-10-CM | POA: Diagnosis not present

## 2012-05-31 DIAGNOSIS — Z09 Encounter for follow-up examination after completed treatment for conditions other than malignant neoplasm: Secondary | ICD-10-CM | POA: Diagnosis not present

## 2012-06-01 ENCOUNTER — Other Ambulatory Visit: Payer: Self-pay | Admitting: Obstetrics and Gynecology

## 2012-06-01 ENCOUNTER — Other Ambulatory Visit: Payer: Self-pay | Admitting: *Deleted

## 2012-06-01 DIAGNOSIS — R920 Mammographic microcalcification found on diagnostic imaging of breast: Secondary | ICD-10-CM

## 2012-06-01 DIAGNOSIS — R928 Other abnormal and inconclusive findings on diagnostic imaging of breast: Secondary | ICD-10-CM

## 2012-06-20 DIAGNOSIS — E041 Nontoxic single thyroid nodule: Secondary | ICD-10-CM | POA: Diagnosis not present

## 2012-06-25 DIAGNOSIS — M47817 Spondylosis without myelopathy or radiculopathy, lumbosacral region: Secondary | ICD-10-CM | POA: Diagnosis not present

## 2012-06-25 DIAGNOSIS — G894 Chronic pain syndrome: Secondary | ICD-10-CM | POA: Diagnosis not present

## 2012-06-25 DIAGNOSIS — M47812 Spondylosis without myelopathy or radiculopathy, cervical region: Secondary | ICD-10-CM | POA: Diagnosis not present

## 2012-06-25 DIAGNOSIS — M961 Postlaminectomy syndrome, not elsewhere classified: Secondary | ICD-10-CM | POA: Diagnosis not present

## 2012-06-28 ENCOUNTER — Other Ambulatory Visit: Payer: Self-pay | Admitting: Dermatology

## 2012-06-28 DIAGNOSIS — Z09 Encounter for follow-up examination after completed treatment for conditions other than malignant neoplasm: Secondary | ICD-10-CM | POA: Diagnosis not present

## 2012-06-28 DIAGNOSIS — L570 Actinic keratosis: Secondary | ICD-10-CM | POA: Diagnosis not present

## 2012-06-28 DIAGNOSIS — L821 Other seborrheic keratosis: Secondary | ICD-10-CM | POA: Diagnosis not present

## 2012-06-28 DIAGNOSIS — N6489 Other specified disorders of breast: Secondary | ICD-10-CM | POA: Diagnosis not present

## 2012-06-28 DIAGNOSIS — Z85828 Personal history of other malignant neoplasm of skin: Secondary | ICD-10-CM | POA: Diagnosis not present

## 2012-06-28 DIAGNOSIS — C44320 Squamous cell carcinoma of skin of unspecified parts of face: Secondary | ICD-10-CM | POA: Diagnosis not present

## 2012-06-29 ENCOUNTER — Encounter: Payer: Self-pay | Admitting: Obstetrics and Gynecology

## 2012-07-05 ENCOUNTER — Ambulatory Visit (INDEPENDENT_AMBULATORY_CARE_PROVIDER_SITE_OTHER): Payer: Medicare Other | Admitting: Surgery

## 2012-07-05 ENCOUNTER — Encounter (INDEPENDENT_AMBULATORY_CARE_PROVIDER_SITE_OTHER): Payer: Self-pay | Admitting: Surgery

## 2012-07-05 VITALS — BP 116/64 | HR 76 | Resp 14 | Ht 63.0 in | Wt 138.0 lb

## 2012-07-05 DIAGNOSIS — N6489 Other specified disorders of breast: Secondary | ICD-10-CM

## 2012-07-05 DIAGNOSIS — IMO0002 Reserved for concepts with insufficient information to code with codable children: Secondary | ICD-10-CM

## 2012-07-05 NOTE — Progress Notes (Signed)
Konrad Penta 78 y.o.  Body mass index is 24.45 kg/(m^2).  Patient Active Problem List  Diagnosis  . COLONIC POLYPS, ADENOMATOUS  . ESOPHAGEAL STRICTURE  . GERD  . HIATAL HERNIA  . DIVERTICULOSIS, COLON  . PERSONAL HX COLONIC POLYPS  . PMB (postmenopausal bleeding)  . Osteopenia  . Hiatal hernia repair with gastroplasty June 2013  . Breast hematoma after core biopsy at [facility]     Allergies  Allergen Reactions  . Boniva (Ibandronic Acid) Other (See Comments)    Flu like symptoms STATES ALL THE OSTEOPOROSIS DRUGS HAVE CAUSED FLU LIKE SYMPTOMS   . Ceftin Other (See Comments)    unknown  . Cymbalta (Duloxetine Hcl) Nausea Only  . Dicloxacillin Diarrhea  . Doxycycline Other (See Comments)    unknown  . Metronidazole Nausea And Vomiting  . Promethazine Hcl     REACTION: Restless leg?    Past Surgical History  Procedure Date  . Total vaginal hysterectomy 1993  . Tubal ligation   . Total hip arthroplasty     left  . Breast surgery     Fibroma, right  . Intraoperative arteriogram   . Verteboplasty   . Spinal fusion     8 level  LUMBAR/THORACIC  . Neuro stimulator     THORACIC AREA-BATTERY IN RIGHT HIP PT TURNS OFF AND ON WITH MAGNET-PT STATES SHE IS PAIN FREE WHEN LYING DOWN -BUT WHEN SITTING OR STANDING SHE EXPERIENCES TINGLING AND PAIN DOWN RIGHT LEG-SCIATIC PAIN  . Hiatal hernia repair 04/17/2012    Procedure: LAPAROSCOPIC REPAIR OF HIATAL HERNIA;  Surgeon: Valarie Merino, MD;  Location: [facility];  Service: General;  Laterality: N/A;  Large Hiatus Hernia   ARONSON,RICHARD A, MD 1. Breast hematoma after core biopsy at [facility]. Boehm returns today after repair of her hiatus hernia.  Because of the extent of her hernia we were unable to get the entire stomach out of her chest but were able to repair the diaphragm with mesh (biological) and did a gastropexy. She is swallowing on. She does have reflux which he is managing with elevation of the head of her bed.  She is taking protonic is b.i.d. and his I think his controlling her acid. I think that's can be the best we can do in managing this problem.  She had a right breast biopsy by Dr. Yolanda Bonine at [place]. She developed a 6 cm hematoma in the side and a recent ultrasound that was done a 5 cm. She has my opinion I told her that I would observe that. I would not aspirate it since it is not bothering her and give her a body several months to resorb this. I'll be glad to see her again if needed about this. Otherwise I'll see her on an as-needed basis. Matt B. Daphine Deutscher, MD, [facility] [phone] beeper [phone]  07/05/2012 11:51 AM

## 2012-07-05 NOTE — Patient Instructions (Signed)
Follow up with Dr. Daphine Deutscher as needed. Would give breast hematoma about 4-6 months to resolve

## 2012-07-26 DIAGNOSIS — C44320 Squamous cell carcinoma of skin of unspecified parts of face: Secondary | ICD-10-CM | POA: Diagnosis not present

## 2012-08-08 DIAGNOSIS — R0609 Other forms of dyspnea: Secondary | ICD-10-CM | POA: Diagnosis not present

## 2012-08-08 DIAGNOSIS — G4700 Insomnia, unspecified: Secondary | ICD-10-CM | POA: Diagnosis not present

## 2012-08-08 DIAGNOSIS — R0989 Other specified symptoms and signs involving the circulatory and respiratory systems: Secondary | ICD-10-CM | POA: Diagnosis not present

## 2012-08-08 DIAGNOSIS — G4730 Sleep apnea, unspecified: Secondary | ICD-10-CM | POA: Diagnosis not present

## 2012-08-08 DIAGNOSIS — G4731 Primary central sleep apnea: Secondary | ICD-10-CM | POA: Diagnosis not present

## 2012-08-08 DIAGNOSIS — R351 Nocturia: Secondary | ICD-10-CM | POA: Diagnosis not present

## 2012-08-14 DIAGNOSIS — Z23 Encounter for immunization: Secondary | ICD-10-CM | POA: Diagnosis not present

## 2012-08-20 DIAGNOSIS — G4700 Insomnia, unspecified: Secondary | ICD-10-CM | POA: Diagnosis not present

## 2012-08-20 DIAGNOSIS — G4730 Sleep apnea, unspecified: Secondary | ICD-10-CM | POA: Diagnosis not present

## 2012-08-20 DIAGNOSIS — G4731 Primary central sleep apnea: Secondary | ICD-10-CM | POA: Diagnosis not present

## 2012-08-21 DIAGNOSIS — M47817 Spondylosis without myelopathy or radiculopathy, lumbosacral region: Secondary | ICD-10-CM | POA: Diagnosis not present

## 2012-08-21 DIAGNOSIS — M47812 Spondylosis without myelopathy or radiculopathy, cervical region: Secondary | ICD-10-CM | POA: Diagnosis not present

## 2012-08-21 DIAGNOSIS — M961 Postlaminectomy syndrome, not elsewhere classified: Secondary | ICD-10-CM | POA: Diagnosis not present

## 2012-08-21 DIAGNOSIS — G894 Chronic pain syndrome: Secondary | ICD-10-CM | POA: Diagnosis not present

## 2012-09-05 DIAGNOSIS — M961 Postlaminectomy syndrome, not elsewhere classified: Secondary | ICD-10-CM | POA: Diagnosis not present

## 2012-09-05 DIAGNOSIS — M545 Low back pain, unspecified: Secondary | ICD-10-CM | POA: Diagnosis not present

## 2012-09-05 DIAGNOSIS — M546 Pain in thoracic spine: Secondary | ICD-10-CM | POA: Diagnosis not present

## 2012-09-05 DIAGNOSIS — G8929 Other chronic pain: Secondary | ICD-10-CM | POA: Diagnosis not present

## 2012-09-08 DIAGNOSIS — M545 Low back pain, unspecified: Secondary | ICD-10-CM | POA: Insufficient documentation

## 2012-09-08 DIAGNOSIS — M961 Postlaminectomy syndrome, not elsewhere classified: Secondary | ICD-10-CM | POA: Insufficient documentation

## 2012-09-20 DIAGNOSIS — M76899 Other specified enthesopathies of unspecified lower limb, excluding foot: Secondary | ICD-10-CM | POA: Diagnosis not present

## 2012-09-27 DIAGNOSIS — D239 Other benign neoplasm of skin, unspecified: Secondary | ICD-10-CM | POA: Diagnosis not present

## 2012-09-27 DIAGNOSIS — Z85828 Personal history of other malignant neoplasm of skin: Secondary | ICD-10-CM | POA: Diagnosis not present

## 2012-09-27 DIAGNOSIS — L821 Other seborrheic keratosis: Secondary | ICD-10-CM | POA: Diagnosis not present

## 2012-09-27 DIAGNOSIS — L820 Inflamed seborrheic keratosis: Secondary | ICD-10-CM | POA: Diagnosis not present

## 2012-10-01 DIAGNOSIS — Z961 Presence of intraocular lens: Secondary | ICD-10-CM | POA: Diagnosis not present

## 2012-10-02 DIAGNOSIS — M961 Postlaminectomy syndrome, not elsewhere classified: Secondary | ICD-10-CM | POA: Diagnosis not present

## 2012-10-02 DIAGNOSIS — G894 Chronic pain syndrome: Secondary | ICD-10-CM | POA: Diagnosis not present

## 2012-10-02 DIAGNOSIS — M47817 Spondylosis without myelopathy or radiculopathy, lumbosacral region: Secondary | ICD-10-CM | POA: Diagnosis not present

## 2012-10-22 DIAGNOSIS — G4737 Central sleep apnea in conditions classified elsewhere: Secondary | ICD-10-CM | POA: Diagnosis not present

## 2012-10-22 DIAGNOSIS — M549 Dorsalgia, unspecified: Secondary | ICD-10-CM | POA: Diagnosis not present

## 2012-11-29 DIAGNOSIS — G894 Chronic pain syndrome: Secondary | ICD-10-CM | POA: Diagnosis not present

## 2012-11-29 DIAGNOSIS — M961 Postlaminectomy syndrome, not elsewhere classified: Secondary | ICD-10-CM | POA: Diagnosis not present

## 2012-11-29 DIAGNOSIS — M47817 Spondylosis without myelopathy or radiculopathy, lumbosacral region: Secondary | ICD-10-CM | POA: Diagnosis not present

## 2012-12-11 DIAGNOSIS — M810 Age-related osteoporosis without current pathological fracture: Secondary | ICD-10-CM | POA: Diagnosis not present

## 2012-12-11 DIAGNOSIS — I10 Essential (primary) hypertension: Secondary | ICD-10-CM | POA: Diagnosis not present

## 2012-12-11 DIAGNOSIS — Z79899 Other long term (current) drug therapy: Secondary | ICD-10-CM | POA: Diagnosis not present

## 2012-12-11 DIAGNOSIS — R82998 Other abnormal findings in urine: Secondary | ICD-10-CM | POA: Diagnosis not present

## 2012-12-18 DIAGNOSIS — M76899 Other specified enthesopathies of unspecified lower limb, excluding foot: Secondary | ICD-10-CM | POA: Diagnosis not present

## 2012-12-19 DIAGNOSIS — Z0000 Encounter for general adult medical examination without abnormal findings: Secondary | ICD-10-CM | POA: Diagnosis not present

## 2012-12-19 DIAGNOSIS — I10 Essential (primary) hypertension: Secondary | ICD-10-CM | POA: Diagnosis not present

## 2012-12-19 DIAGNOSIS — M1990 Unspecified osteoarthritis, unspecified site: Secondary | ICD-10-CM | POA: Diagnosis not present

## 2012-12-19 DIAGNOSIS — M810 Age-related osteoporosis without current pathological fracture: Secondary | ICD-10-CM | POA: Diagnosis not present

## 2012-12-24 DIAGNOSIS — Z1212 Encounter for screening for malignant neoplasm of rectum: Secondary | ICD-10-CM | POA: Diagnosis not present

## 2012-12-25 DIAGNOSIS — M76899 Other specified enthesopathies of unspecified lower limb, excluding foot: Secondary | ICD-10-CM | POA: Diagnosis not present

## 2012-12-25 DIAGNOSIS — L680 Hirsutism: Secondary | ICD-10-CM | POA: Diagnosis not present

## 2012-12-25 DIAGNOSIS — I789 Disease of capillaries, unspecified: Secondary | ICD-10-CM | POA: Diagnosis not present

## 2012-12-25 DIAGNOSIS — L819 Disorder of pigmentation, unspecified: Secondary | ICD-10-CM | POA: Diagnosis not present

## 2012-12-25 DIAGNOSIS — L821 Other seborrheic keratosis: Secondary | ICD-10-CM | POA: Diagnosis not present

## 2012-12-25 DIAGNOSIS — B079 Viral wart, unspecified: Secondary | ICD-10-CM | POA: Diagnosis not present

## 2012-12-25 DIAGNOSIS — L820 Inflamed seborrheic keratosis: Secondary | ICD-10-CM | POA: Diagnosis not present

## 2012-12-26 DIAGNOSIS — M76899 Other specified enthesopathies of unspecified lower limb, excluding foot: Secondary | ICD-10-CM | POA: Diagnosis not present

## 2012-12-28 DIAGNOSIS — M76899 Other specified enthesopathies of unspecified lower limb, excluding foot: Secondary | ICD-10-CM | POA: Diagnosis not present

## 2012-12-31 DIAGNOSIS — M76899 Other specified enthesopathies of unspecified lower limb, excluding foot: Secondary | ICD-10-CM | POA: Diagnosis not present

## 2013-01-01 DIAGNOSIS — M810 Age-related osteoporosis without current pathological fracture: Secondary | ICD-10-CM | POA: Diagnosis not present

## 2013-01-02 ENCOUNTER — Telehealth (INDEPENDENT_AMBULATORY_CARE_PROVIDER_SITE_OTHER): Payer: Self-pay | Admitting: *Deleted

## 2013-01-02 DIAGNOSIS — M76899 Other specified enthesopathies of unspecified lower limb, excluding foot: Secondary | ICD-10-CM | POA: Diagnosis not present

## 2013-01-02 NOTE — Telephone Encounter (Signed)
Patient called to state that over the last couple months she is having the feeling of pressure in her abdominal area.  Patient states nausea and dizziness when she bends over.  Patient states that release of gas does no improve feeling.  Patient denies any pain and denies difficulty with bowel or urination.  Patient scheduled to come see Daphine Deutscher MD.

## 2013-01-02 NOTE — Telephone Encounter (Signed)
LMOM for patient to call me back to let patient know we had to reschedule her appt to 01/08/2013 at 130p

## 2013-01-04 DIAGNOSIS — M76899 Other specified enthesopathies of unspecified lower limb, excluding foot: Secondary | ICD-10-CM | POA: Diagnosis not present

## 2013-01-07 DIAGNOSIS — M76899 Other specified enthesopathies of unspecified lower limb, excluding foot: Secondary | ICD-10-CM | POA: Diagnosis not present

## 2013-01-08 ENCOUNTER — Ambulatory Visit (INDEPENDENT_AMBULATORY_CARE_PROVIDER_SITE_OTHER): Payer: Medicare Other | Admitting: Surgery

## 2013-01-08 ENCOUNTER — Encounter (INDEPENDENT_AMBULATORY_CARE_PROVIDER_SITE_OTHER): Payer: Self-pay | Admitting: Surgery

## 2013-01-08 VITALS — BP 128/82 | HR 66 | Temp 97.7°F | Resp 18 | Ht 63.0 in | Wt 149.0 lb

## 2013-01-08 DIAGNOSIS — K449 Diaphragmatic hernia without obstruction or gangrene: Secondary | ICD-10-CM | POA: Diagnosis not present

## 2013-01-08 NOTE — Patient Instructions (Addendum)
Followup as needed  Continue Protonix for reflux Avoid constricting garments to abdomen especially during the afternoon

## 2013-01-08 NOTE — Progress Notes (Signed)
Stacy Parker 79 y.o.  Body mass index is 26.4 kg/(m^2).  Patient Active Problem List  Diagnosis  . COLONIC POLYPS, ADENOMATOUS  . ESOPHAGEAL STRICTURE  . GERD  . HIATAL HERNIA  . DIVERTICULOSIS, COLON  . PERSONAL HX COLONIC POLYPS  . PMB (postmenopausal bleeding)  . Osteopenia  . Hiatal hernia repair with gastroplasty June 2013  . Breast hematoma after core biopsy at [facility]     Allergies  Allergen Reactions  . Boniva (Ibandronic Acid) Other (See Comments)    Flu like symptoms STATES ALL THE OSTEOPOROSIS DRUGS HAVE CAUSED FLU LIKE SYMPTOMS   . Ceftin Other (See Comments)    unknown  . Cymbalta (Duloxetine Hcl) Nausea Only  . Dicloxacillin Diarrhea  . Doxycycline Other (See Comments)    unknown  . Metronidazole Nausea And Vomiting  . Promethazine Hcl     REACTION: Restless leg?    Past Surgical History  Procedure Laterality Date  . Total vaginal hysterectomy  1993  . Tubal ligation    . Total hip arthroplasty      left  . Breast surgery      Fibroma, right  . Intraoperative arteriogram    . Verteboplasty    . Spinal fusion      8 level  LUMBAR/THORACIC  . Neuro stimulator      THORACIC AREA-BATTERY IN RIGHT HIP PT TURNS OFF AND ON WITH MAGNET-PT STATES SHE IS PAIN FREE WHEN LYING DOWN -BUT WHEN SITTING OR STANDING SHE EXPERIENCES TINGLING AND PAIN DOWN RIGHT LEG-SCIATIC PAIN  . Hiatal hernia repair  04/17/2012    Procedure: LAPAROSCOPIC REPAIR OF HIATAL HERNIA;  Surgeon: Valarie Merino, MD;  Location: [facility];  Service: General;  Laterality: N/A;  Large Hiatus Hernia   ARONSON,RICHARD A, MD No diagnosis found.  Has been noticing that garments are more constricting especially in the afternoon.  It is not really bloating but she notices it when she bends over.  Her visit was to ask if I was concerned about this.   I suspect that she has lost some height and she has her stomach now down in her abdomen more.  This will account for the full feeling.  She can try  things such as "beano" that decrease gas.  I would follow and see her as needed.   Matt B. Daphine Deutscher, MD, [facility] [phone] beeper [phone]  01/08/2013 2:19 PM

## 2013-01-10 DIAGNOSIS — M76899 Other specified enthesopathies of unspecified lower limb, excluding foot: Secondary | ICD-10-CM | POA: Diagnosis not present

## 2013-01-14 IMAGING — CR DG ABDOMEN ACUTE W/ 1V CHEST
3 series · 3 of 3 positions shown · non-contrast
Comparison: 10/26/2009 CT scan

CLINICAL DATA: Left chest pain and flank pain, pleuritic.

ACUTE ABDOMEN SERIES (ABDOMEN 2 VIEW & CHEST 1 VIEW)

[w chest pa]
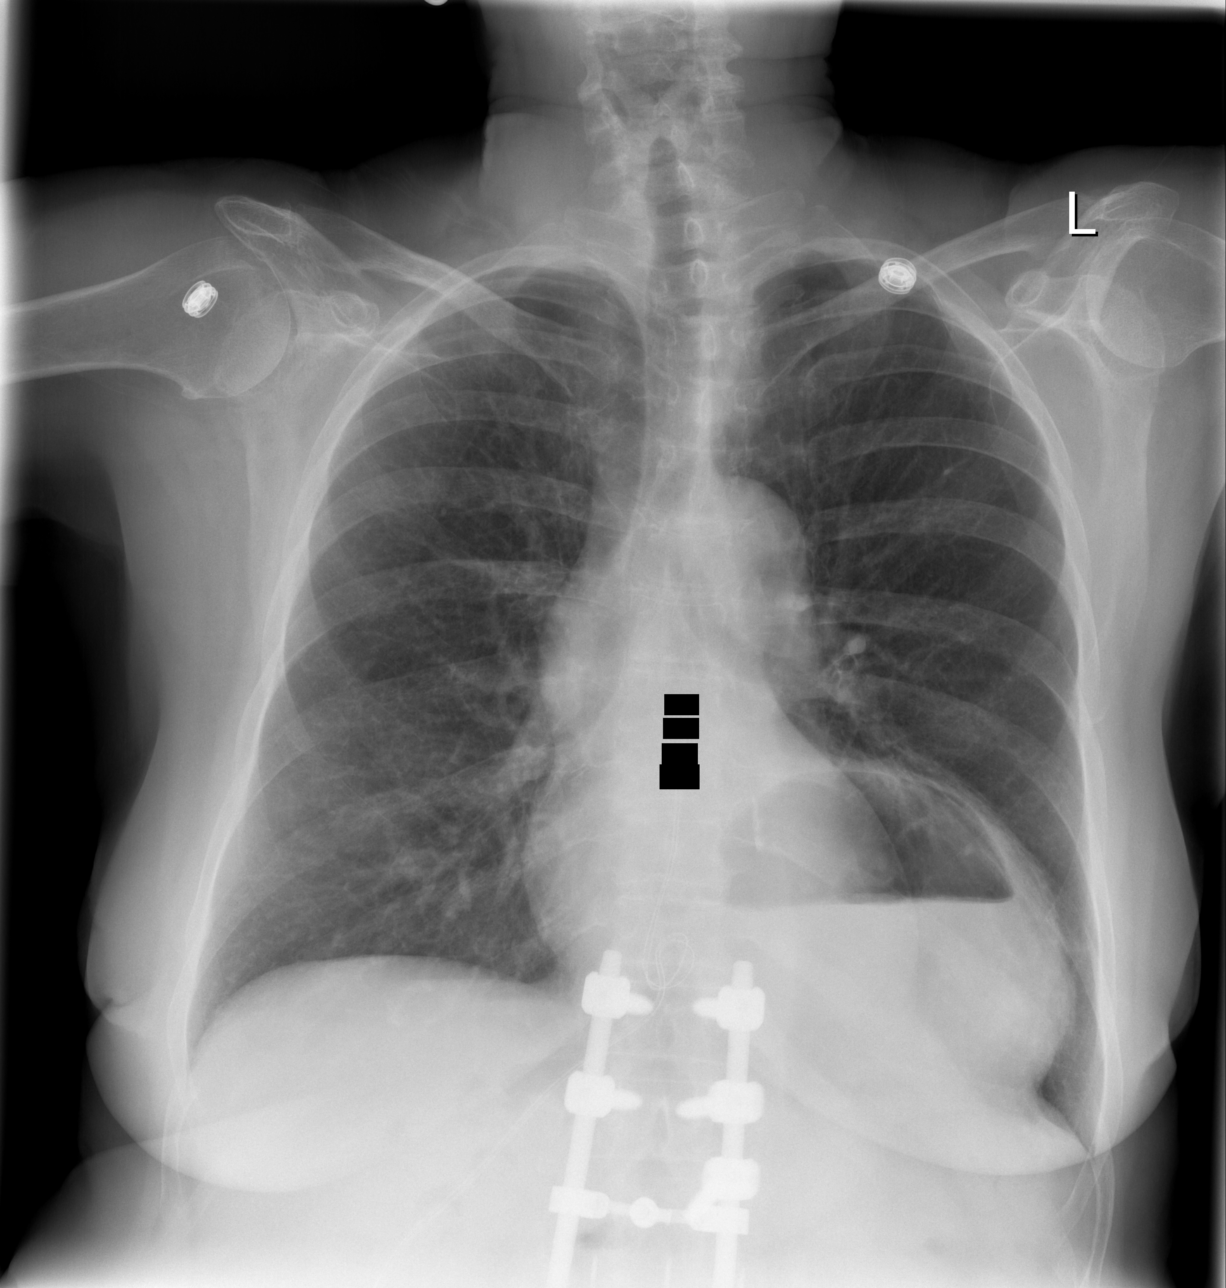

[w abdomen upright]
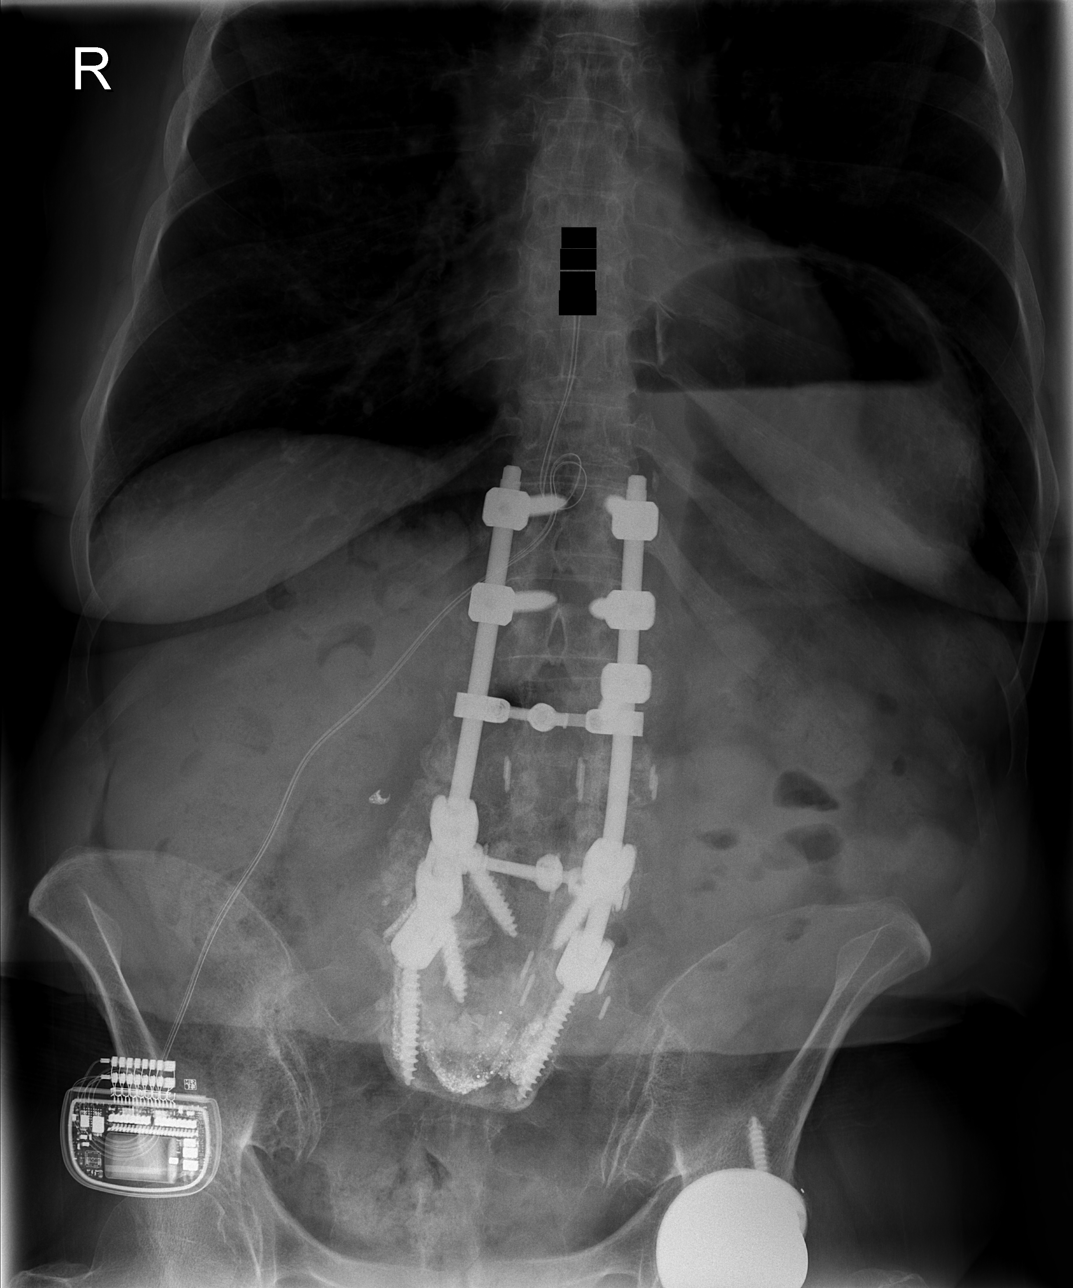

[t abdomen supine]
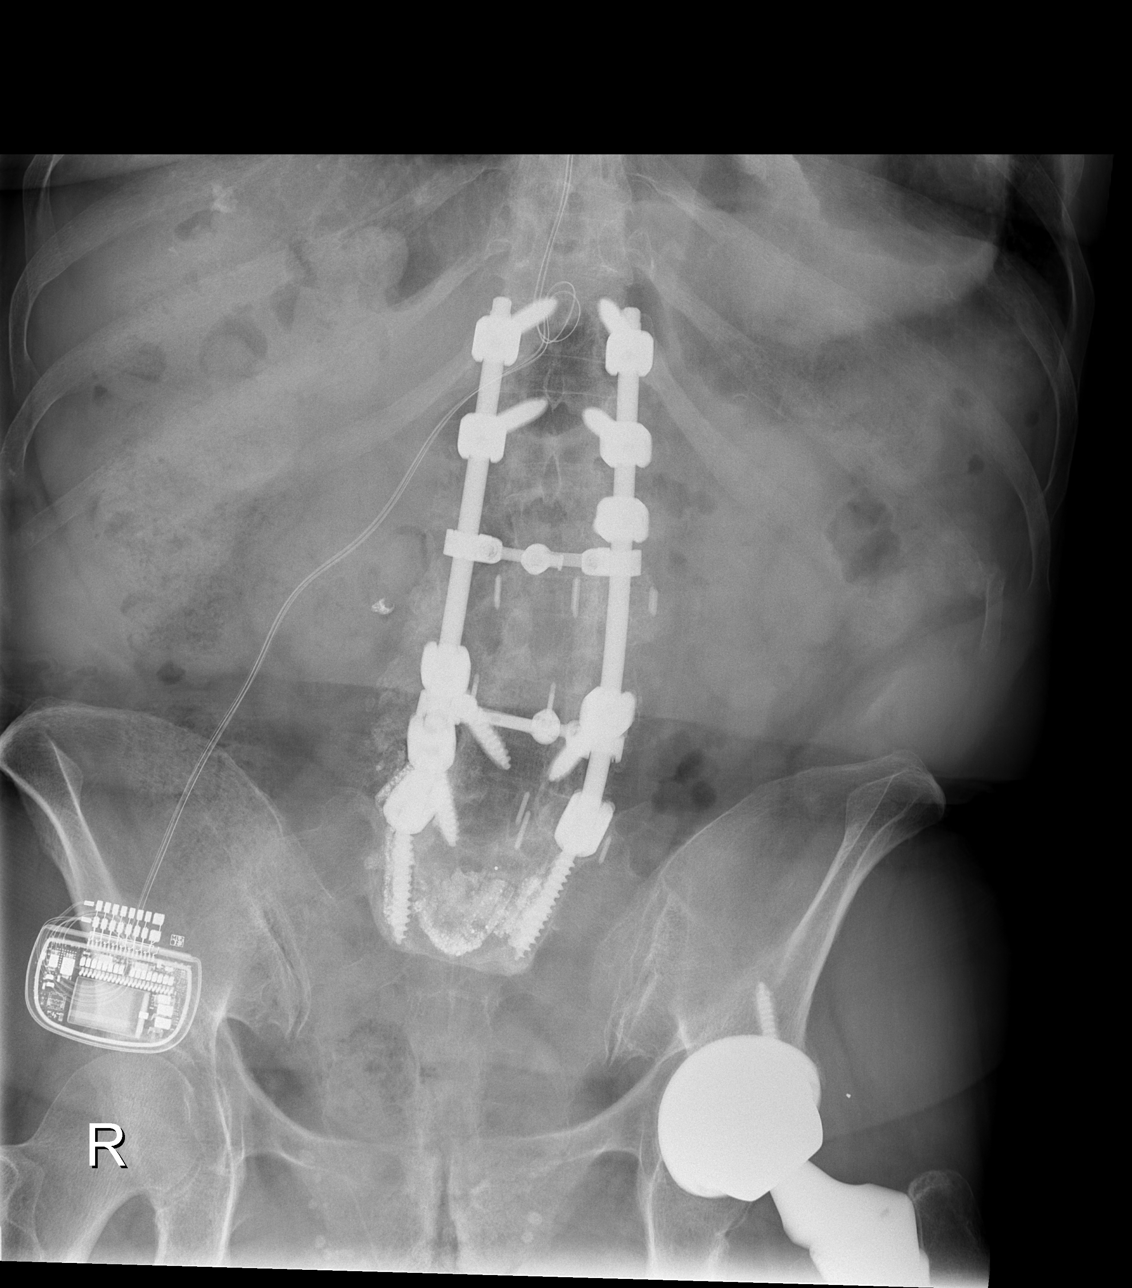

[3 of 3 positions shown; findings below may reference images not displayed]

FINDINGS: Large hiatal hernia noted at the left lung base.  Dorsal
column stimulator projects over the T8-9 level.  There is
posterolateral rod and pedicle screw fixation in the lumbar spine
and lower thoracic spine along with a left total hip prosthesis.
Considerable bone graft material noted along the lower lumbar
spine, with evidence of prior posterior decompression.

Mild atelectasis node along the margins of hiatal hernia.

No free intraperitoneal gas is evident within the abdomen.

There is borderline prominence of stool in the colon.  Scattered
air-fluid levels in nondilated left sided bowel noted on the
upright view, possibly reflecting localized ileus or enteritis.  No
dilated bowel noted to suggest obstruction.
IMPRESSION: 1.  Scattered air-fluid levels in the left abdominal small bowel,
possibly from localized ileus or enteritis.
2.  Thoracolumbar postoperative findings, with left hip implant and
dorsal column stimulator.
3.  Large chronic hiatal hernia in the left chest, with adjacent
subsegmental atelectasis.

## 2013-01-25 ENCOUNTER — Ambulatory Visit (INDEPENDENT_AMBULATORY_CARE_PROVIDER_SITE_OTHER): Payer: Medicare Other | Admitting: Surgery

## 2013-02-07 DIAGNOSIS — G894 Chronic pain syndrome: Secondary | ICD-10-CM | POA: Diagnosis not present

## 2013-02-07 DIAGNOSIS — M47817 Spondylosis without myelopathy or radiculopathy, lumbosacral region: Secondary | ICD-10-CM | POA: Diagnosis not present

## 2013-02-07 DIAGNOSIS — M961 Postlaminectomy syndrome, not elsewhere classified: Secondary | ICD-10-CM | POA: Diagnosis not present

## 2013-03-07 DIAGNOSIS — L723 Sebaceous cyst: Secondary | ICD-10-CM | POA: Diagnosis not present

## 2013-03-07 DIAGNOSIS — I789 Disease of capillaries, unspecified: Secondary | ICD-10-CM | POA: Diagnosis not present

## 2013-03-27 ENCOUNTER — Other Ambulatory Visit: Payer: Self-pay | Admitting: Dermatology

## 2013-03-27 DIAGNOSIS — L821 Other seborrheic keratosis: Secondary | ICD-10-CM | POA: Diagnosis not present

## 2013-03-27 DIAGNOSIS — D485 Neoplasm of uncertain behavior of skin: Secondary | ICD-10-CM | POA: Diagnosis not present

## 2013-03-27 DIAGNOSIS — L723 Sebaceous cyst: Secondary | ICD-10-CM | POA: Diagnosis not present

## 2013-03-27 DIAGNOSIS — L570 Actinic keratosis: Secondary | ICD-10-CM | POA: Diagnosis not present

## 2013-03-27 DIAGNOSIS — L820 Inflamed seborrheic keratosis: Secondary | ICD-10-CM | POA: Diagnosis not present

## 2013-03-27 DIAGNOSIS — D239 Other benign neoplasm of skin, unspecified: Secondary | ICD-10-CM | POA: Diagnosis not present

## 2013-04-04 DIAGNOSIS — M47817 Spondylosis without myelopathy or radiculopathy, lumbosacral region: Secondary | ICD-10-CM | POA: Diagnosis not present

## 2013-04-04 DIAGNOSIS — M2170 Unequal limb length (acquired), unspecified site: Secondary | ICD-10-CM | POA: Diagnosis not present

## 2013-04-04 DIAGNOSIS — M961 Postlaminectomy syndrome, not elsewhere classified: Secondary | ICD-10-CM | POA: Diagnosis not present

## 2013-04-04 DIAGNOSIS — G894 Chronic pain syndrome: Secondary | ICD-10-CM | POA: Diagnosis not present

## 2013-04-04 DIAGNOSIS — M7750 Other enthesopathy of unspecified foot: Secondary | ICD-10-CM | POA: Diagnosis not present

## 2013-04-25 ENCOUNTER — Encounter: Payer: Self-pay | Admitting: Women's Health

## 2013-04-25 ENCOUNTER — Ambulatory Visit (INDEPENDENT_AMBULATORY_CARE_PROVIDER_SITE_OTHER): Payer: Medicare Other | Admitting: Women's Health

## 2013-04-25 VITALS — BP 114/62 | Ht 61.5 in | Wt 148.0 lb

## 2013-04-25 DIAGNOSIS — N952 Postmenopausal atrophic vaginitis: Secondary | ICD-10-CM | POA: Diagnosis not present

## 2013-04-25 NOTE — Patient Instructions (Addendum)
Health Recommendations for Postmenopausal Women Respected and ongoing research has looked at the most common causes of death, disability, and poor quality of life in postmenopausal women. The causes include heart disease, diseases of blood vessels, diabetes, depression, cancer, and bone loss (osteoporosis). Many things can be done to help lower the chances of developing these and other common problems: CARDIOVASCULAR DISEASE Heart Disease: A heart attack is a medical emergency. Know the signs and symptoms of a heart attack. Below are things women can do to reduce their risk for heart disease.   Do not smoke. If you smoke, quit.  Aim for a healthy weight. Being overweight causes many preventable deaths. Eat a healthy and balanced diet and drink an adequate amount of liquids.  Get moving. Make a commitment to be more physically active. Aim for 30 minutes of activity on most, if not all days of the week.  Eat for heart health. Choose a diet that is low in saturated fat and cholesterol and eliminate trans fat. Include whole grains, vegetables, and fruits. Read and understand the labels on food containers before buying.  Know your numbers. Ask your caregiver to check your blood pressure, cholesterol (total, HDL, LDL, triglycerides) and blood glucose. Work with your caregiver on improving your entire clinical picture.  High blood pressure. Limit or stop your table salt intake (try salt substitute and food seasonings). Avoid salty foods and drinks. Read labels on food containers before buying. Eating well and exercising can help control high blood pressure. STROKE  Stroke is a medical emergency. Stroke may be the result of a blood clot in a blood vessel in the brain or by a brain hemorrhage (bleeding). Know the signs and symptoms of a stroke. To lower the risk of developing a stroke:  Avoid fatty foods.  Quit smoking.  Control your diabetes, blood pressure, and irregular heart rate. THROMBOPHLEBITIS  (BLOOD CLOT) OF THE LEG  Becoming overweight and leading a stationary lifestyle may also contribute to developing blood clots. Controlling your diet and exercising will help lower the risk of developing blood clots. CANCER SCREENING  Breast Cancer: Take steps to reduce your risk of breast cancer.  You should practice "breast self-awareness." This means understanding the normal appearance and feel of your breasts and should include breast self-examination. Any changes detected, no matter how small, should be reported to your caregiver.  After age 40, you should have a clinical breast exam (CBE) every year.  Starting at age 40, you should consider having a mammogram (breast X-ray) every year.  If you have a family history of breast cancer, talk to your caregiver about genetic screening.  If you are at high risk for breast cancer, talk to your caregiver about having an MRI and a mammogram every year.  Intestinal or Stomach Cancer: Tests to consider are a rectal exam, fecal occult blood, sigmoidoscopy, and colonoscopy. Women who are high risk may need to be screened at an earlier age and more often.  Cervical Cancer:  Beginning at age 30, you should have a Pap test every 3 years as long as the past 3 Pap tests have been normal.  If you have had past treatment for cervical cancer or a condition that could lead to cancer, you need Pap tests and screening for cancer for at least 20 years after your treatment.  If you had a hysterectomy for a problem that was not cancer or a condition that could lead to cancer, then you no longer need Pap tests.    If you are between ages 65 and 70, and you have had normal Pap tests going back 10 years, you no longer need Pap tests.  If Pap tests have been discontinued, risk factors (such as a new sexual partner) need to be reassessed to determine if screening should be resumed.  Some medical problems can increase the chance of getting cervical cancer. In these  cases, your caregiver may recommend more frequent screening and Pap tests.  Uterine Cancer: If you have vaginal bleeding after reaching menopause, you should notify your caregiver.  Ovarian cancer: Other than yearly pelvic exams, there are no reliable tests available to screen for ovarian cancer at this time except for yearly pelvic exams.  Lung Cancer: Yearly chest X-rays can detect lung cancer and should be done on high risk women, such as cigarette smokers and women with chronic lung disease (emphysema).  Skin Cancer: A complete body skin exam should be done at your yearly examination. Avoid overexposure to the sun and ultraviolet light lamps. Use a strong sun block cream when in the sun. All of these things are important in lowering the risk of skin cancer. MENOPAUSE Menopause Symptoms: Hormone therapy products are effective for treating symptoms associated with menopause:  Moderate to severe hot flashes.  Night sweats.  Mood swings.  Headaches.  Tiredness.  Loss of sex drive.  Insomnia.  Other symptoms. Hormone replacement carries certain risks, especially in older women. Women who use or are thinking about using estrogen or estrogen with progestin treatments should discuss that with their caregiver. Your caregiver will help you understand the benefits and risks. The ideal dose of hormone replacement therapy is not known. The Food and Drug Administration (FDA) has concluded that hormone therapy should be used only at the lowest doses and for the shortest amount of time to reach treatment goals.  OSTEOPOROSIS Protecting Against Bone Loss and Preventing Fracture: If you use hormone therapy for prevention of bone loss (osteoporosis), the risks for bone loss must outweigh the risk of the therapy. Ask your caregiver about other medications known to be safe and effective for preventing bone loss and fractures. To guard against bone loss or fractures, the following is recommended:  If  you are less than age 50, take 1000 mg of calcium and at least 600 mg of Vitamin D per day.  If you are greater than age 50 but less than age 70, take 1200 mg of calcium and at least 600 mg of Vitamin D per day.  If you are greater than age 70, take 1200 mg of calcium and at least 800 mg of Vitamin D per day. Smoking and excessive alcohol intake increases the risk of osteoporosis. Eat foods rich in calcium and vitamin D and do weight bearing exercises several times a week as your caregiver suggests. DIABETES Diabetes Melitus: If you have Type I or Type 2 diabetes, you should keep your blood sugar under control with diet, exercise and recommended medication. Avoid too many sweets, starchy and fatty foods. Being overweight can make control more difficult. COGNITION AND MEMORY Cognition and Memory: Menopausal hormone therapy is not recommended for the prevention of cognitive disorders such as Alzheimer's disease or memory loss.  DEPRESSION  Depression may occur at any age, but is common in elderly women. The reasons may be because of physical, medical, social (loneliness), or financial problems and needs. If you are experiencing depression because of medical problems and control of symptoms, talk to your caregiver about this. Physical activity and   exercise may help with mood and sleep. Community and volunteer involvement may help your sense of value and worth. If you have depression and you feel that the problem is getting worse or becoming severe, talk to your caregiver about treatment options that are best for you. ACCIDENTS  Accidents are common and can be serious in the elderly woman. Prepare your house to prevent accidents. Eliminate throw rugs, place hand bars in the bath, shower and toilet areas. Avoid wearing high heeled shoes or walking on wet, snowy, and icy areas. Limit or stop driving if you have vision or hearing problems, or you feel you are unsteady with you movements and  reflexes. HEPATITIS C Hepatitis C is a type of viral infection affecting the liver. It is spread mainly through contact with blood from an infected person. It can be treated, but if left untreated, it can lead to severe liver damage over years. Many people who are infected do not know that the virus is in their blood. If you are a "baby-boomer", it is recommended that you have one screening test for Hepatitis C. IMMUNIZATIONS  Several immunizations are important to consider having during your senior years, including:   Tetanus, diptheria, and pertussis booster shot.  Influenza every year before the flu season begins.  Pneumonia vaccine.  Shingles vaccine.  Others as indicated based on your specific needs. Talk to your caregiver about these. Document Released: 12/16/2005 Document Revised: 10/10/2012 Document Reviewed: 08/11/2008 [employer] Patient Information 2014 [employer].  

## 2013-04-25 NOTE — Progress Notes (Signed)
Stacy Parker 1934-07-16 [id number]    History:    The patient presents for Breast and pelvic exam. Postmenopausal on no HRT. TVH with BSO 1993 for postmenopausal bleeding. History of normal Paps. Hypertension, basal, squamous skin cancer, osteopenia, diverticulosis. Negative right breast biopsy 7/13, hematoma after.   Past medical history, past surgical history, family history and social history were all reviewed and documented in the EPIC chart. Originally from [place], children  42, 66, 51 all doing fine, daughter having pelvic issues. Retired Chartered loss adjuster. Back surgery causing some mobility issues.   Exam:  Filed Vitals:   04/25/13 1405  BP: 114/62    General appearance:  Normal Head/Neck:  Normal, without cervical or supraclavicular adenopathy. Thyroid:  Symmetrical, normal in size, without palpable masses or nodularity. Respiratory  Effort:  Normal  Auscultation:  Clear without wheezing or rhonchi Cardiovascular  Auscultation:  Regular rate, without rubs, murmurs or gallops  Edema/varicosities:  Not grossly evident Abdominal  Soft,nontender, without masses, guarding or rebound.  Liver/spleen:  No organomegaly noted  Hernia:  None appreciated  Skin  Inspection:  Grossly normal  Palpation:  Grossly normal Neurologic/psychiatric  Orientation:  Normal with appropriate conversation.  Mood/affect:  Normal  Genitourinary    Breasts: Examined lying and sitting.     Right: Without masses, retractions, discharge or axillary adenopathy.     Left: Without masses, retractions, discharge or axillary adenopathy.   Inguinal/mons:  Normal without inguinal adenopathy  External genitalia:  Normal  BUS/Urethra/Skene's glands:  Normal  Bladder:  Normal  Vagina:  Atrophic vaginitis  Cervix:  absent  Uterus:  Absent  Adnexa/parametria:     Rt: Without masses or tenderness.   Lt: Without masses or tenderness.  Anus and perineum: Normal  Digital rectal exam: Normal  sphincter tone without palpated masses or tenderness  Assessment/Plan:  79 y.o. MWF G2 P2 +1 adopted for breast and pelvic exam.  Osteopenia managed at [facility] Hypertension-primary care labs and meds Squamous and basal skin cancer-dermatologist  Plan: SBE's, continue annual mammogram, calcium rich diet, vitamin D 2000 daily encouraged. Home safety and fall prevention discussed. Aware of need to wear sunscreen and some protection. Normal Pap history, reviewed new screening guidelines. Current on vaccines. Continue care with primary care. Encouraged vaginal lubricants of sexually active reports rare.    Harrington Challenger [facility], 2:38 PM 04/25/2013

## 2013-04-29 ENCOUNTER — Encounter: Payer: Self-pay | Admitting: Obstetrics and Gynecology

## 2013-05-08 DIAGNOSIS — M19049 Primary osteoarthritis, unspecified hand: Secondary | ICD-10-CM | POA: Diagnosis not present

## 2013-05-28 DIAGNOSIS — M47812 Spondylosis without myelopathy or radiculopathy, cervical region: Secondary | ICD-10-CM | POA: Diagnosis not present

## 2013-05-28 DIAGNOSIS — G894 Chronic pain syndrome: Secondary | ICD-10-CM | POA: Diagnosis not present

## 2013-05-28 DIAGNOSIS — M961 Postlaminectomy syndrome, not elsewhere classified: Secondary | ICD-10-CM | POA: Diagnosis not present

## 2013-07-10 DIAGNOSIS — Z1231 Encounter for screening mammogram for malignant neoplasm of breast: Secondary | ICD-10-CM | POA: Diagnosis not present

## 2013-07-11 DIAGNOSIS — N6489 Other specified disorders of breast: Secondary | ICD-10-CM | POA: Diagnosis not present

## 2013-07-23 DIAGNOSIS — M47817 Spondylosis without myelopathy or radiculopathy, lumbosacral region: Secondary | ICD-10-CM | POA: Diagnosis not present

## 2013-07-23 DIAGNOSIS — G894 Chronic pain syndrome: Secondary | ICD-10-CM | POA: Diagnosis not present

## 2013-07-23 DIAGNOSIS — M961 Postlaminectomy syndrome, not elsewhere classified: Secondary | ICD-10-CM | POA: Diagnosis not present

## 2013-07-24 ENCOUNTER — Other Ambulatory Visit: Payer: Self-pay | Admitting: Radiology

## 2013-07-24 DIAGNOSIS — N6489 Other specified disorders of breast: Secondary | ICD-10-CM | POA: Diagnosis not present

## 2013-07-24 DIAGNOSIS — D249 Benign neoplasm of unspecified breast: Secondary | ICD-10-CM | POA: Diagnosis not present

## 2013-07-24 DIAGNOSIS — N641 Fat necrosis of breast: Secondary | ICD-10-CM | POA: Diagnosis not present

## 2013-07-25 ENCOUNTER — Other Ambulatory Visit: Payer: Self-pay | Admitting: Dermatology

## 2013-07-25 DIAGNOSIS — L570 Actinic keratosis: Secondary | ICD-10-CM | POA: Diagnosis not present

## 2013-07-25 DIAGNOSIS — Z85828 Personal history of other malignant neoplasm of skin: Secondary | ICD-10-CM | POA: Diagnosis not present

## 2013-07-25 DIAGNOSIS — L821 Other seborrheic keratosis: Secondary | ICD-10-CM | POA: Diagnosis not present

## 2013-07-25 DIAGNOSIS — C44319 Basal cell carcinoma of skin of other parts of face: Secondary | ICD-10-CM | POA: Diagnosis not present

## 2013-07-25 DIAGNOSIS — D485 Neoplasm of uncertain behavior of skin: Secondary | ICD-10-CM | POA: Diagnosis not present

## 2013-07-29 ENCOUNTER — Other Ambulatory Visit: Payer: Self-pay | Admitting: Internal Medicine

## 2013-07-29 DIAGNOSIS — K219 Gastro-esophageal reflux disease without esophagitis: Secondary | ICD-10-CM

## 2013-07-29 DIAGNOSIS — M1990 Unspecified osteoarthritis, unspecified site: Secondary | ICD-10-CM | POA: Diagnosis not present

## 2013-07-29 DIAGNOSIS — M4120 Other idiopathic scoliosis, site unspecified: Secondary | ICD-10-CM | POA: Diagnosis not present

## 2013-07-29 DIAGNOSIS — I10 Essential (primary) hypertension: Secondary | ICD-10-CM | POA: Diagnosis not present

## 2013-07-29 DIAGNOSIS — M810 Age-related osteoporosis without current pathological fracture: Secondary | ICD-10-CM | POA: Diagnosis not present

## 2013-07-29 DIAGNOSIS — IMO0002 Reserved for concepts with insufficient information to code with codable children: Secondary | ICD-10-CM | POA: Diagnosis not present

## 2013-07-29 DIAGNOSIS — R109 Unspecified abdominal pain: Secondary | ICD-10-CM

## 2013-07-30 ENCOUNTER — Ambulatory Visit
Admission: RE | Admit: 2013-07-30 | Discharge: 2013-07-30 | Disposition: A | Discharge status: Home / Self Care | Payer: Medicare Other | Source: Ambulatory Visit | Attending: Internal Medicine | Admitting: Internal Medicine

## 2013-07-30 DIAGNOSIS — R109 Unspecified abdominal pain: Secondary | ICD-10-CM

## 2013-07-30 DIAGNOSIS — K8020 Calculus of gallbladder without cholecystitis without obstruction: Secondary | ICD-10-CM | POA: Diagnosis not present

## 2013-08-13 ENCOUNTER — Telehealth: Payer: Self-pay | Admitting: Neurology

## 2013-08-15 NOTE — Telephone Encounter (Signed)
Spoke to patient. She has questions she says she would like to addressed before her next scheduled OV on 10/22/13 w/ Dr. Vickey Huger. First she would like to know if a sleep study is required since she has reduced her Oxycontin from 20mg  tid to 15mg  tid because she says her problem is central. Next she has questions about her CPAP as far as cleaning and supplies. Patient she says these questions cannot be addressed by the supplier [employer].

## 2013-08-16 NOTE — Telephone Encounter (Signed)
Replacement parts for CPAP - nasal interface quarterly, the nasal pillow insert monthly, the headgear yearly , tubing and filter every 6 month. Please call DME for these questions.   New sleep study may not be necessary, depending on residual symptoms and CPAP download. She may leave a message with the sleep lab to see what her last numbers were. CD

## 2013-08-20 NOTE — Telephone Encounter (Signed)
LM for pt asked her to call me directly at the sleep lab in order to have her questions answered. -sh

## 2013-08-21 ENCOUNTER — Encounter: Payer: Self-pay | Admitting: Neurology

## 2013-08-21 DIAGNOSIS — C44319 Basal cell carcinoma of skin of other parts of face: Secondary | ICD-10-CM | POA: Diagnosis not present

## 2013-08-21 DIAGNOSIS — Z85828 Personal history of other malignant neoplasm of skin: Secondary | ICD-10-CM | POA: Diagnosis not present

## 2013-08-30 ENCOUNTER — Ambulatory Visit (INDEPENDENT_AMBULATORY_CARE_PROVIDER_SITE_OTHER): Payer: Medicare Other | Admitting: Neurology

## 2013-08-30 ENCOUNTER — Ambulatory Visit (INDEPENDENT_AMBULATORY_CARE_PROVIDER_SITE_OTHER): Payer: Medicare Other | Admitting: *Deleted

## 2013-08-30 ENCOUNTER — Encounter: Payer: Self-pay | Admitting: *Deleted

## 2013-08-30 ENCOUNTER — Encounter: Payer: Self-pay | Admitting: Neurology

## 2013-08-30 VITALS — BP 102/69 | HR 62 | Resp 18 | Ht 63.0 in | Wt 142.0 lb

## 2013-08-30 VITALS — HR 61

## 2013-08-30 DIAGNOSIS — G4737 Central sleep apnea in conditions classified elsewhere: Secondary | ICD-10-CM | POA: Diagnosis not present

## 2013-08-30 DIAGNOSIS — R0902 Hypoxemia: Secondary | ICD-10-CM | POA: Diagnosis not present

## 2013-08-30 DIAGNOSIS — G4733 Obstructive sleep apnea (adult) (pediatric): Secondary | ICD-10-CM

## 2013-08-30 NOTE — Sleep Study (Signed)
Patient arrives for HST instruction.  Patient is given written instructions, picture instructions, and a demonstration on how to use HST unit.  All questions / concerns were addressed by technologist.  Financial responsibility was explained.  Follow up information was given to patient regarding how results would be received.  Patient is doing this test because her Oxycontin has been reduced and her download shows optimal AHI.  She has not tolerated ASV therapy well and would like to see if she still requires the device since making these changes to her respiratory supressing narcotic medication.  Pt will probably use the unit on Monday / Tuesday due to some weekend travel. -sh

## 2013-08-30 NOTE — Progress Notes (Signed)
[facility]  Provider:  Melvyn Novas, M D  Referring Provider: Minda Meo, MD Primary Care Physician:  Minda Meo, MD  Chief Complaint  Patient presents with  . office visit    Wants Dr. to review most recent download form he CPAP    HPI:  Stacy Parker is a 79 y.o. female  Is seen here as a  revisit  from Dr. Jacky Kindle for  Sleep apnea. The patient never had developed a great love for her CPAP. The patient actually had to be titrated to an ASV machine a special setting for positive airway pressure. Or apnea was considered central in nature more than obstructive. The patient is an unusual  with an apnea patient is a neck circumference of 13 inches and a body mass index of 25. At the time of her face patient was patient was treated with OxyContin which she  meanwhile takesat a reduced dose - But  at the time of the study her AHI was 23 ( central sleep apnea )  and her RDI 23.4 all events were non-REM sleep related and seemed to be not related to the position.  The patient also had an oxygen nadir of 85% and spent 129 minutes of sleep and desaturations below 90%.  Compared with the previous standard CPAP study there was a significant improvement. The EEP -end expiratory pressure - is set at  6 cm . She had been placed on an Eson  standard size mask.  She had regular followups for compliance which has been around 70%. The patient does not use the machine on weekends. The ASV Ismene by such to 6 cm water pressure with a minimum pressure support of 3 cm and a maximum of 10 cm water her last download from 08-19-13 documented an AHI of 8.1 which I found unsatisfactory. However over all days of the patient's compliance was sufficient I CMS criteria was 4 hours and 42 minutes of  Daily therapy time- After reducing narcotic medications from 20- to 15 mg 3 times  Daily,  Mrs. Gosa now underwent another compliance download.  This time showing an AHI of 1.8 under the  same settings and the same compliance by time.  It clearly documents that a reduction of 25% of her overall narcotic dose  has a positive impact on her sleep apnea. Her fatigue score today is 30 points and the Epworth sleepiness score : 2 points.   The patient's question is if she still needs a  machine at all and if there is an overweight for her to see what her baseline apnea burden may be now that she has taken care off the culprit. The central apnea was certainly narcotic medication induced and she may have found a doors that is not creating the same problem. I discussed with her today that there is a home sleep test because he was as a screening device I hope that her insurance coverage will permit [place] the cheaper home sleep test  versus in lab observation sleep  study. She would be asked to undergo with cast on the day when she takes her medications but is not to be  tested under the influence of positive airway pressure therapy.        Review of Systems: Out of a complete 14 system review, the patient complains of only the following symptoms, and all other reviewed systems are negative.  Epworth 2 , FSS 30,   History   Social History  .  Marital Status: Married    Spouse Name: N/A    Number of Children: 2  . Years of Education: N/A   Occupational History  . retired    Social History Main Topics  . Smoking status: Former Smoker    Types: Cigarettes  . Smokeless tobacco: Never Used     Comment: QUIT SMOKING IN HER EARLY 40'S  AND WAS NEVER A HEAVY SMOKER  . Alcohol Use: Yes     Comment: rarely  . Drug Use: No  . Sexual Activity: No   Other Topics Concern  . Not on file   Social History Narrative  . No narrative on file    Family History  Problem Relation Age of Onset  . Hypertension Mother   . COPD Mother   . Heart disease Father   . Hypertension Father   . Breast cancer Maternal Grandmother   . Skin cancer Father     Past Medical History  Diagnosis Date  .  Osteopenia   . Reflux   . Sciatic nerve disease   . Depression   . Anxiety   . Insomnia   . HTN (hypertension)   . Basal cell carcinoma   . Squamous carcinoma   . Diverticulosis   . Renal artery stenosis     PT STATES STUDY WAS DONE YRS AGO--STATES HER KIDNEY FUNCTION IS FINE  . H/O hiatal hernia   . GERD (gastroesophageal reflux disease)   . Arthritis   . Osteoporosis   . Sciatic pain     RIGHT LEG / BACK PAIN- PT ON OXYCONTIN 3 TIMES A DAY--STATES MUST CONTINUE WHILE IN HOSPITAL TO AVOID WITHDRAWAL  . Cervical arthritis     PT STATES CERVICAL SPINE BADLY DEGENERATED--SHE DOES NOT HAVE NECK PAIN -BUT TOLD HER NECK IS FRAGILE  . Sleep apnea 04/12/2012    STOP BANG SCORE 4    Past Surgical History  Procedure Laterality Date  . Total vaginal hysterectomy  1993  . Tubal ligation    . Total hip arthroplasty      left  . Breast surgery      Fibroma, right  . Intraoperative arteriogram    . Verteboplasty    . Spinal fusion      8 level  LUMBAR/THORACIC  . Neuro stimulator      THORACIC AREA-BATTERY IN RIGHT HIP PT TURNS OFF AND ON WITH MAGNET-PT STATES SHE IS PAIN FREE WHEN LYING DOWN -BUT WHEN SITTING OR STANDING SHE EXPERIENCES TINGLING AND PAIN DOWN RIGHT LEG-SCIATIC PAIN  . Hiatal hernia repair  04/17/2012    Procedure: LAPAROSCOPIC REPAIR OF HIATAL HERNIA;  Surgeon: Valarie Merino, MD;  Location: [facility];  Service: General;  Laterality: N/A;  Large Hiatus Hernia    Current Outpatient Prescriptions  Medication Sig Dispense Refill  . aspirin EC 81 MG tablet Take 81 mg by mouth See admin instructions. Takes 4 times per week..monday,wednesday,friday,saturday      . Biotin 5000 MCG CAPS Take 1 capsule by mouth daily.      . Calcium-Magnesium-Zinc 167-83-8 MG TABS Take 1 tablet by mouth daily.      . cholecalciferol (VITAMIN D) 1000 UNITS tablet Take 1,000 Units by mouth daily.      . citalopram (CELEXA) 20 MG tablet Take 20 mg by mouth 2 (two) times daily.       Marland Kitchen gabapentin  (NEURONTIN) 300 MG capsule Take 300 mg by mouth 3 (three) times daily.      Marland Kitchen losartan-hydrochlorothiazide (HYZAAR)  50-12.5 MG per tablet Take 1 tablet by mouth 2 (two) times daily.       . methocarbamol (ROBAXIN) 500 MG tablet Take 500 mg by mouth 4 (four) times daily. PT HAS- BUT NEVER USES--IT IS FOR BACK SPASMS      . Multiple Vitamin (MULTIVITAMIN) tablet Take 1 tablet by mouth daily.       . OMEGA 3 1200 MG CAPS Take 1 capsule by mouth 2 (two) times daily.      Marland Kitchen OVER THE COUNTER MEDICATION Take 1 tablet by mouth daily. Calcium 500mg ,magnesium80mg ,250 vitamin d      . OVER THE COUNTER MEDICATION QUININE SULFATE  300 MG PO   ONE ON SUN, TUES, THURS, SAT - PT STATES RX THAT SHE OBTAINS FROM [place]  FOR LEG CRAMPS      . oxyCODONE (OXYCONTIN) 20 MG 12 hr tablet Take 15 mg by mouth 3 (three) times daily.       Marland Kitchen oxyCODONE-acetaminophen (PERCOCET) 5-325 MG per tablet Take 1 tablet by mouth every 4 (four) hours as needed. BREAK THRU PAIN--PT HAS - BUT NEVER USES      . Probiotic Product (ALIGN PO) Take by mouth. ONE DAILY      . quiNIDine sulfate 300 MG tablet Take 300 mg by mouth daily.       . temazepam (RESTORIL) 15 MG capsule Take 15 mg by mouth at bedtime.      . vitamin C (ASCORBIC ACID) 500 MG tablet Take 500 mg by mouth daily.      Tery Sanfilippo Sodium (DOC-Q-LACE PO) Take 1 tablet by mouth 2 (two) times daily.       . pantoprazole (PROTONIX) 40 MG tablet Take 1 tablet (40 mg total) by mouth daily.  60 tablet  3   No current facility-administered medications for this visit.    Allergies as of 08/30/2013 - Review Complete 08/30/2013  Allergen Reaction Noted  . Boniva [ibandronic acid] Other (See Comments) 03/19/2012  . Ceftin Other (See Comments) 10/12/2011  . Cymbalta [duloxetine hcl] Nausea Only 01/05/2012  . Dicloxacillin Diarrhea 01/05/2012  . Doxycycline Other (See Comments)   . Metronidazole Nausea And Vomiting   . Promethazine hcl      Vitals: BP 102/69  Pulse 62  Resp 18   Ht 5\' 3"  (1.6 m)  Wt 142 lb (64.411 kg)  BMI 25.16 kg/m2 Last Weight:  Wt Readings from Last 1 Encounters:  08/30/13 142 lb (64.411 kg)   Last Height:   Ht Readings from Last 1 Encounters:  08/30/13 5\' 3"  (1.6 m)   BMI  .  Physical exam:  General: The patient is awake, alert and appears not in acute distress. The patient is well groomed. Head: Normocephalic, atraumatic. Neck is supple. Mallampati 2, neck circumference: 13.75. Cardiovascular:  Regular rate and rhythm , without  murmurs or carotid bruit, and without distended neck veins. Respiratory: Lungs are clear to auscultation. Skin:  Without evidence of edema, or rash Trunk: BMI is 25 ,.  Neurologic exam : The patient is awake and alert, oriented to place and time.  Memory subjective  described as intact. There is a normal attention span & concentration ability. Speech is fluent without dysarthria, dysphonia or aphasia. Mood and affect are appropriate.  Cranial nerves: Pupils are equal and briskly reactive to light. Funduscopic exam without  evidence of pallor or edema. Extraocular movements  in vertical and horizontal planes intact and without nystagmus.  Visual fields by finger perimetry are intact. Hearing  to finger rub intact.  Facial sensation intact to fine touch.  Facial motor strength is symmetric and tongue and uvula move midline.  Motor exam:   Normal tone and normal muscle bulk and symmetric normal strength in all extremities.  Sensory:  Fine touch, pinprick and vibration- Proprioception is normal.  Coordination: Rapid alternating movements in the fingers/hands is tested and normal.  Gait and station: Patient walks without assistive device. Deep tendon reflexes: in the  upper and lower extremities are symmetric and intact. Babinski maneuver response is  downgoing.   Assessment:  After physical and neurologic examination, review of laboratory studies, imaging, neurophysiology testing and pre-existing records,  assessment is that of reduced and now optimally treated central apnea.  She may not need to remain on ASV , if a repeat study can show a now lower AHI below 10 and less hypoxemia than before.    Plan:  Treatment plan and additional workup : I will see if a HST can be obtained to evaluate for new baseline level of central apnea,but if not , I will need her to return for an in lab observed  sleep study.

## 2013-09-01 DIAGNOSIS — Z23 Encounter for immunization: Secondary | ICD-10-CM | POA: Diagnosis not present

## 2013-09-02 DIAGNOSIS — G4733 Obstructive sleep apnea (adult) (pediatric): Secondary | ICD-10-CM | POA: Diagnosis not present

## 2013-09-02 DIAGNOSIS — G4731 Primary central sleep apnea: Secondary | ICD-10-CM

## 2013-09-05 NOTE — Sleep Study (Signed)
Patient returned unit on 09/04/13, study was scored and finalized for Dr. Oliva Bustard interpretation on 09/05/13. -sh

## 2013-09-05 NOTE — Sleep Study (Signed)
See media tab for full report  

## 2013-09-11 ENCOUNTER — Telehealth: Payer: Self-pay | Admitting: *Deleted

## 2013-09-11 NOTE — Telephone Encounter (Signed)
Left message for patient regarding home sleep study results, asked patient to call me back to discuss results and have questions answered.  Explained that a copy of the sleep study was sent to referring physician and copy of study is coming to them in the mail.  Left detailed info for this patient to let her know her AHI showed to be 28/hr and Dr. Vickey Huger recommends continued use of ASV therapy.

## 2013-09-13 ENCOUNTER — Other Ambulatory Visit: Payer: Self-pay | Admitting: *Deleted

## 2013-09-13 DIAGNOSIS — G4737 Central sleep apnea in conditions classified elsewhere: Secondary | ICD-10-CM

## 2013-09-13 DIAGNOSIS — R0902 Hypoxemia: Secondary | ICD-10-CM

## 2013-09-17 DIAGNOSIS — G894 Chronic pain syndrome: Secondary | ICD-10-CM | POA: Diagnosis not present

## 2013-09-17 DIAGNOSIS — M961 Postlaminectomy syndrome, not elsewhere classified: Secondary | ICD-10-CM | POA: Diagnosis not present

## 2013-09-17 DIAGNOSIS — M47817 Spondylosis without myelopathy or radiculopathy, lumbosacral region: Secondary | ICD-10-CM | POA: Diagnosis not present

## 2013-09-24 DIAGNOSIS — N644 Mastodynia: Secondary | ICD-10-CM | POA: Diagnosis not present

## 2013-10-14 DIAGNOSIS — Z961 Presence of intraocular lens: Secondary | ICD-10-CM | POA: Diagnosis not present

## 2013-10-22 ENCOUNTER — Ambulatory Visit: Payer: Self-pay | Admitting: Neurology

## 2013-11-22 DIAGNOSIS — M76899 Other specified enthesopathies of unspecified lower limb, excluding foot: Secondary | ICD-10-CM | POA: Diagnosis not present

## 2013-11-22 DIAGNOSIS — M1710 Unilateral primary osteoarthritis, unspecified knee: Secondary | ICD-10-CM | POA: Diagnosis not present

## 2013-11-28 DIAGNOSIS — Z85828 Personal history of other malignant neoplasm of skin: Secondary | ICD-10-CM | POA: Diagnosis not present

## 2013-11-28 DIAGNOSIS — L819 Disorder of pigmentation, unspecified: Secondary | ICD-10-CM | POA: Diagnosis not present

## 2013-11-28 DIAGNOSIS — L821 Other seborrheic keratosis: Secondary | ICD-10-CM | POA: Diagnosis not present

## 2013-12-12 DIAGNOSIS — G4700 Insomnia, unspecified: Secondary | ICD-10-CM | POA: Diagnosis not present

## 2013-12-12 DIAGNOSIS — G894 Chronic pain syndrome: Secondary | ICD-10-CM | POA: Diagnosis not present

## 2013-12-12 DIAGNOSIS — M961 Postlaminectomy syndrome, not elsewhere classified: Secondary | ICD-10-CM | POA: Diagnosis not present

## 2013-12-12 DIAGNOSIS — M25559 Pain in unspecified hip: Secondary | ICD-10-CM | POA: Diagnosis not present

## 2013-12-18 DIAGNOSIS — I10 Essential (primary) hypertension: Secondary | ICD-10-CM | POA: Diagnosis not present

## 2013-12-18 DIAGNOSIS — M810 Age-related osteoporosis without current pathological fracture: Secondary | ICD-10-CM | POA: Diagnosis not present

## 2013-12-18 DIAGNOSIS — R809 Proteinuria, unspecified: Secondary | ICD-10-CM | POA: Diagnosis not present

## 2013-12-25 DIAGNOSIS — M961 Postlaminectomy syndrome, not elsewhere classified: Secondary | ICD-10-CM | POA: Diagnosis not present

## 2013-12-25 DIAGNOSIS — G894 Chronic pain syndrome: Secondary | ICD-10-CM | POA: Diagnosis not present

## 2013-12-26 DIAGNOSIS — Z1212 Encounter for screening for malignant neoplasm of rectum: Secondary | ICD-10-CM | POA: Diagnosis not present

## 2013-12-30 DIAGNOSIS — M1990 Unspecified osteoarthritis, unspecified site: Secondary | ICD-10-CM | POA: Diagnosis not present

## 2013-12-30 DIAGNOSIS — Z1331 Encounter for screening for depression: Secondary | ICD-10-CM | POA: Diagnosis not present

## 2013-12-30 DIAGNOSIS — K219 Gastro-esophageal reflux disease without esophagitis: Secondary | ICD-10-CM | POA: Diagnosis not present

## 2013-12-30 DIAGNOSIS — M549 Dorsalgia, unspecified: Secondary | ICD-10-CM | POA: Diagnosis not present

## 2013-12-30 DIAGNOSIS — Z0000 Encounter for general adult medical examination without abnormal findings: Secondary | ICD-10-CM | POA: Diagnosis not present

## 2013-12-30 DIAGNOSIS — IMO0002 Reserved for concepts with insufficient information to code with codable children: Secondary | ICD-10-CM | POA: Diagnosis not present

## 2013-12-30 DIAGNOSIS — I10 Essential (primary) hypertension: Secondary | ICD-10-CM | POA: Diagnosis not present

## 2013-12-30 DIAGNOSIS — Z23 Encounter for immunization: Secondary | ICD-10-CM | POA: Diagnosis not present

## 2013-12-30 DIAGNOSIS — M810 Age-related osteoporosis without current pathological fracture: Secondary | ICD-10-CM | POA: Diagnosis not present

## 2013-12-31 DIAGNOSIS — Z96649 Presence of unspecified artificial hip joint: Secondary | ICD-10-CM | POA: Diagnosis not present

## 2013-12-31 DIAGNOSIS — M1710 Unilateral primary osteoarthritis, unspecified knee: Secondary | ICD-10-CM | POA: Diagnosis not present

## 2014-01-14 DIAGNOSIS — M810 Age-related osteoporosis without current pathological fracture: Secondary | ICD-10-CM | POA: Diagnosis not present

## 2014-01-16 ENCOUNTER — Telehealth: Payer: Self-pay | Admitting: Neurology

## 2014-01-16 NOTE — Telephone Encounter (Signed)
Please advise 

## 2014-01-16 NOTE — Telephone Encounter (Signed)
Patient wants to know if she can go 5 nights without her VPAP. Please call to advise. If she does not answer okay to leave a message.

## 2014-01-16 NOTE — Telephone Encounter (Signed)
Called and spoke with Stacy Parker regarding her question.  She explained Dr. Brett Fairy gave her permission to go without the unit for two nights in a row when they travel to [place].  I told her that she will be at risk for having sleep apnea if she goes without her machine and medically I cannot advise her that it is in her best interest to go without the use of the equipment.  I did explain that patients do choose to go without all the time but they do so at their own risk.  She understood.  She is leaving out of town on April first and she would like Dr. Brett Fairy to contact her as soon as she returns from her vacation in order to advise her on her question and get her permission to go without using the machine.  I told her I would forward the message to Dr. Brett Fairy for her to respond to when she returns on the 25th.

## 2014-01-17 NOTE — Telephone Encounter (Signed)
I called patient. The patient will be traveling by airplane for a five-day trip. It is probably okay for her to go without her CPAP machine. The patient will not be operating a motor vehicle.

## 2014-02-04 DIAGNOSIS — M1710 Unilateral primary osteoarthritis, unspecified knee: Secondary | ICD-10-CM | POA: Diagnosis not present

## 2014-02-06 DIAGNOSIS — M47817 Spondylosis without myelopathy or radiculopathy, lumbosacral region: Secondary | ICD-10-CM | POA: Diagnosis not present

## 2014-02-06 DIAGNOSIS — G894 Chronic pain syndrome: Secondary | ICD-10-CM | POA: Diagnosis not present

## 2014-02-06 DIAGNOSIS — M961 Postlaminectomy syndrome, not elsewhere classified: Secondary | ICD-10-CM | POA: Diagnosis not present

## 2014-03-04 DIAGNOSIS — M47817 Spondylosis without myelopathy or radiculopathy, lumbosacral region: Secondary | ICD-10-CM | POA: Diagnosis not present

## 2014-03-04 DIAGNOSIS — G894 Chronic pain syndrome: Secondary | ICD-10-CM | POA: Diagnosis not present

## 2014-03-04 DIAGNOSIS — M961 Postlaminectomy syndrome, not elsewhere classified: Secondary | ICD-10-CM | POA: Diagnosis not present

## 2014-03-11 DIAGNOSIS — M1710 Unilateral primary osteoarthritis, unspecified knee: Secondary | ICD-10-CM | POA: Diagnosis not present

## 2014-03-27 ENCOUNTER — Other Ambulatory Visit: Payer: Self-pay | Admitting: Dermatology

## 2014-03-27 DIAGNOSIS — D485 Neoplasm of uncertain behavior of skin: Secondary | ICD-10-CM | POA: Diagnosis not present

## 2014-03-27 DIAGNOSIS — L819 Disorder of pigmentation, unspecified: Secondary | ICD-10-CM | POA: Diagnosis not present

## 2014-03-27 DIAGNOSIS — Z85828 Personal history of other malignant neoplasm of skin: Secondary | ICD-10-CM | POA: Diagnosis not present

## 2014-03-27 DIAGNOSIS — L570 Actinic keratosis: Secondary | ICD-10-CM | POA: Diagnosis not present

## 2014-03-27 DIAGNOSIS — L821 Other seborrheic keratosis: Secondary | ICD-10-CM | POA: Diagnosis not present

## 2014-03-27 DIAGNOSIS — D2330 Other benign neoplasm of skin of unspecified part of face: Secondary | ICD-10-CM | POA: Diagnosis not present

## 2014-03-27 DIAGNOSIS — D239 Other benign neoplasm of skin, unspecified: Secondary | ICD-10-CM | POA: Diagnosis not present

## 2014-03-27 DIAGNOSIS — L820 Inflamed seborrheic keratosis: Secondary | ICD-10-CM | POA: Diagnosis not present

## 2014-04-11 ENCOUNTER — Encounter: Payer: Self-pay | Admitting: Neurology

## 2014-04-15 DIAGNOSIS — M1990 Unspecified osteoarthritis, unspecified site: Secondary | ICD-10-CM | POA: Diagnosis not present

## 2014-04-15 DIAGNOSIS — I10 Essential (primary) hypertension: Secondary | ICD-10-CM | POA: Diagnosis not present

## 2014-04-15 DIAGNOSIS — Z0289 Encounter for other administrative examinations: Secondary | ICD-10-CM | POA: Diagnosis not present

## 2014-04-15 DIAGNOSIS — Z111 Encounter for screening for respiratory tuberculosis: Secondary | ICD-10-CM | POA: Diagnosis not present

## 2014-04-18 ENCOUNTER — Ambulatory Visit (INDEPENDENT_AMBULATORY_CARE_PROVIDER_SITE_OTHER): Payer: Medicare Other | Admitting: Neurology

## 2014-04-18 ENCOUNTER — Encounter: Payer: Self-pay | Admitting: Neurology

## 2014-04-18 ENCOUNTER — Encounter (INDEPENDENT_AMBULATORY_CARE_PROVIDER_SITE_OTHER): Payer: Self-pay

## 2014-04-18 VITALS — BP 106/72 | HR 72 | Resp 17 | Ht 63.0 in | Wt 145.0 lb

## 2014-04-18 DIAGNOSIS — G4730 Sleep apnea, unspecified: Secondary | ICD-10-CM | POA: Diagnosis not present

## 2014-04-18 DIAGNOSIS — G4731 Primary central sleep apnea: Secondary | ICD-10-CM

## 2014-04-18 DIAGNOSIS — G4733 Obstructive sleep apnea (adult) (pediatric): Secondary | ICD-10-CM | POA: Diagnosis not present

## 2014-04-18 DIAGNOSIS — G4737 Central sleep apnea in conditions classified elsewhere: Secondary | ICD-10-CM

## 2014-04-18 NOTE — Progress Notes (Signed)
[facility]  Provider:  Larey Seat, M D  Referring Provider: Geoffery Lyons, MD Primary Care Physician:  Geoffery Lyons, MD  Chief Complaint  Patient presents with  . Follow-up    Room 10  . Sleep Apnea    HPI:  Stacy Parker is a 80 y.o. female  Is seen here as a  revisit  from Dr. Reynaldo Minium for  Sleep apnea.The patient never had developed a great love for her CPAP. The patient actually had to be titrated to an ASV machine a special setting for positive airway pressure.  apnea was considered central in nature more than obstructive. The patient is an unusual  with an apnea patient is a neck circumference of 13 inches and a body mass index of 25.At the time of her study this  patient was patient was treated with OxyContin which she  meanwhile takes at a reduced dose - But at the time of the study her AHI was 23 ( central sleep apnea )  and her RDI 23.4 all events were non-REM sleep related and seemed to be not related to the position.  The patient also had an oxygen nadir of 85% and spent 129 minutes of sleep and desaturations below 90%.  Compared with the previous standard CPAP study there was a significant improvement. The EEP -end expiratory pressure - is set at  6 cm . She had been placed on an Eson  standard size mask.  She had regular followups for compliance which has been around 70%. The patient does not use the machine on weekends. The ASV Ismene by such to 6 cm water pressure with a minimum pressure support of 3 cm and a maximum of 10 cm water her last download from 08-19-13 documented an AHI of 8.1 which I found unsatisfactory. However over all days of the patient's compliance was sufficient I CMS criteria was 4 hours and 42 minutes of  Daily therapy time- After reducing narcotic medications from 20- to 15 mg 3 times  Daily,  Stacy Parker now underwent another compliance download.  This time showing an AHI of 1.8 under the same settings and the same  compliance by time. It clearly documents that a reduction of 25% of her overall narcotic dose has a positive impact on her sleep apnea. Her fatigue score today is 30 points and the Epworth sleepiness score : 2 points.     Rv 04-18-14 , We meeting here today to review the reasons download of the patient's CPAP in comparison to her home sleep test. The patient used CPAP 70% of the time of the last 90 days, on average 4 hours and 53 minutes in 6 hours and 59 minutes on all days you gross. On weekends she is not using the CPAP machine. She was sent on an UltraTag treated between 6 and 10 cm water pressure. A residual AHI is 3.7 the 95th percentile for pressure was 13.2 with centimeter water to the stable tidal volume, and respiratory rate between 9 and 12 and admitted for ventilation just short of 500 mL per minute. She was in [place] in [place] during the time of the highest residual AHI . She enjoyed the humidity at 3.5 level in the dry climate.    The patient's question is if she still needs a machine at all . My answer is yes, she needed 95% pressure at 12 cm water . She is willing to undergo a test in the lab on temazepam. ,  Review of Systems: Out of a complete 14 system review, the patient complains of only the following symptoms, and all other reviewed systems are negative.  Epworth 1 , FSS 39, GDS 2 .  History   Social History  . Marital Status: Married    Spouse Name: N/A    Number of Children: 2  . Years of Education: N/A   Occupational History  . retired    Social History Main Topics  . Smoking status: Former Smoker    Types: Cigarettes  . Smokeless tobacco: Never Used     Comment: QUIT SMOKING IN HER EARLY 40'S  AND WAS NEVER A HEAVY SMOKER  . Alcohol Use: Yes     Comment: rarely  . Drug Use: No  . Sexual Activity: No   Other Topics Concern  . Not on file   Social History Narrative  . No narrative on file    Family History  Problem Relation Age of  Onset  . Hypertension Mother   . COPD Mother   . Heart disease Father   . Hypertension Father   . Breast cancer Maternal Grandmother   . Skin cancer Father     Past Medical History  Diagnosis Date  . Osteopenia   . Reflux   . Sciatic nerve disease   . Depression   . Anxiety   . Insomnia   . HTN (hypertension)   . Basal cell carcinoma   . Squamous carcinoma   . Diverticulosis   . Renal artery stenosis     PT STATES STUDY WAS DONE YRS AGO--STATES HER KIDNEY FUNCTION IS FINE  . H/O hiatal hernia   . GERD (gastroesophageal reflux disease)   . Arthritis   . Osteoporosis   . Sciatic pain     RIGHT LEG / BACK PAIN- PT ON OXYCONTIN 3 TIMES A DAY--STATES MUST CONTINUE WHILE IN HOSPITAL TO AVOID WITHDRAWAL  . Cervical arthritis     PT STATES CERVICAL SPINE BADLY DEGENERATED--SHE DOES NOT HAVE NECK PAIN -BUT TOLD HER NECK IS FRAGILE  . Sleep apnea 04/12/2012    STOP BANG SCORE 4    Past Surgical History  Procedure Laterality Date  . Total vaginal hysterectomy  1993  . Tubal ligation    . Total hip arthroplasty      left  . Breast surgery      Fibroma, right  . Intraoperative arteriogram    . Verteboplasty    . Spinal fusion      8 level  LUMBAR/THORACIC  . Neuro stimulator      THORACIC AREA-BATTERY IN RIGHT HIP PT TURNS OFF AND ON WITH MAGNET-PT STATES SHE IS PAIN FREE WHEN LYING DOWN -BUT WHEN SITTING OR STANDING SHE EXPERIENCES TINGLING AND PAIN DOWN RIGHT LEG-SCIATIC PAIN  . Hiatal hernia repair  04/17/2012    Procedure: LAPAROSCOPIC REPAIR OF HIATAL HERNIA;  Surgeon: Pedro Earls, MD;  Location: [facility];  Service: General;  Laterality: N/A;  Large Hiatus Hernia    Current Outpatient Prescriptions  Medication Sig Dispense Refill  . aspirin EC 81 MG tablet Take 81 mg by mouth See admin instructions. Takes 4 times per week..monday,wednesday,friday,saturday      . Biotin 5000 MCG CAPS Take 1 capsule by mouth daily.      . Calcium-Magnesium-Zinc 167-83-8 MG TABS Take 1  tablet by mouth daily.      . cholecalciferol (VITAMIN D) 1000 UNITS tablet Take 1,000 Units by mouth daily.      . citalopram (  CELEXA) 20 MG tablet Take 20 mg by mouth 2 (two) times daily.       Mariane Baumgarten Sodium (DOC-Q-LACE PO) Take 1 tablet by mouth 2 (two) times daily.       Marland Kitchen gabapentin (NEURONTIN) 300 MG capsule Take 300 mg by mouth 3 (three) times daily.      Marland Kitchen losartan-hydrochlorothiazide (HYZAAR) 100-25 MG per tablet Take 1 tablet by mouth daily.      . methocarbamol (ROBAXIN) 500 MG tablet Take 500 mg by mouth 4 (four) times daily. PT HAS- BUT NEVER USES--IT IS FOR BACK SPASMS      . Multiple Vitamin (MULTIVITAMIN) tablet Take 1 tablet by mouth daily.       . OMEGA 3 1200 MG CAPS Take 1 capsule by mouth 2 (two) times daily.      Marland Kitchen OVER THE COUNTER MEDICATION Take 1 tablet by mouth daily. Calcium 500mg ,magnesium80mg ,250 vitamin d      . OVER THE COUNTER MEDICATION QUININE SULFATE  300 MG PO daily- PT STATES RX THAT SHE OBTAINS FROM [place]  FOR LEG CRAMPS      . oxyCODONE (OXYCONTIN) 20 MG 12 hr tablet Take by mouth 3 (three) times daily. 10 mg      . oxyCODONE-acetaminophen (PERCOCET) 5-325 MG per tablet Take 1 tablet by mouth every 4 (four) hours as needed. BREAK THRU PAIN--PT HAS - BUT NEVER USES      . Probiotic Product (ALIGN PO) Take by mouth. ONE DAILY      . quiNIDine sulfate 300 MG tablet Take 300 mg by mouth daily.       . temazepam (RESTORIL) 15 MG capsule Take 15 mg by mouth at bedtime.      . vitamin C (ASCORBIC ACID) 500 MG tablet Take 500 mg by mouth daily.      . pantoprazole (PROTONIX) 40 MG tablet Take 40 mg by mouth 2 (two) times daily.       No current facility-administered medications for this visit.    Allergies as of 04/18/2014 - Review Complete 04/18/2014  Allergen Reaction Noted  . Ambien  [zolpidem tartrate] Diarrhea 04/18/2014  . Boniva [ibandronic acid] Other (See Comments) 03/19/2012  . Ceftin Other (See Comments) 10/12/2011  . Cymbalta [duloxetine hcl]  Nausea Only 01/05/2012  . Ceftin  [cefuroxime axetil] Diarrhea and Swelling 04/18/2014  . Dicloxacillin Diarrhea 01/05/2012  . Doxycycline Other (See Comments) and Nausea Only   . Metronidazole Nausea And Vomiting and Nausea Only   . Promethazine hcl    . Phenergan  [promethazine hcl] Other (See Comments) 04/18/2014    Vitals: BP 106/72  Pulse 72  Resp 17  Ht 5\' 3"  (1.6 m)  Wt 145 lb (65.772 kg)  BMI 25.69 kg/m2 Last Weight:  Wt Readings from Last 1 Encounters:  04/18/14 145 lb (65.772 kg)   Last Height:   Ht Readings from Last 1 Encounters:  04/18/14 5\' 3"  (1.6 m)    Physical exam:  General: The patient is awake, alert and appears not in acute distress. The patient is well groomed. Head: Normocephalic, atraumatic. Neck is supple. Mallampati 2, neck circumference: 13.75. Cardiovascular:  Regular rate and rhythm , without  murmurs or carotid bruit, and without distended neck veins. Respiratory: Lungs are clear to auscultation. Skin:  Without evidence of edema, or rash Trunk: BMI is 25 ,.  Neurologic exam : The patient is awake and alert, oriented to place and time.  Memory subjective described as intact. There is a normal attention  span & concentration ability. Speech is fluent without dysarthria, dysphonia or aphasia. Mood and affect are appropriate.  Cranial nerves: Pupils are equal and briskly reactive to light. Funduscopic exam without  evidence of pallor or edema. Extraocular movements  in vertical and horizontal planes intact and without nystagmus.  Visual fields by finger perimetry are intact. Hearing to finger rub intact.  Facial sensation intact to fine touch.  Facial motor strength is symmetric and tongue and uvula move midline.  Motor exam:   Normal tone and normal muscle bulk and symmetric normal strength in all extremities.  Sensory:  Fine touch, pinprick and vibration- Proprioception is normal.  Coordination: Rapid alternating movements in the fingers/hands  is tested and normal.  Gait and station: Patient walks without assistive device. Deep tendon reflexes: in the  upper and lower extremities are symmetric and intact. Babinski maneuver response is  downgoing.   Assessment:  After physical and neurologic examination, review of laboratory studies, imaging, neurophysiology testing and pre-existing records, assessment is that of reduced and now optimally treated central apnea.  She may not need to remain on ASV .  A repeat study can show her current true AHI  And if any hypoxemia is present.   Not a HST, as we deal with central apnea.   Plan:  Treatment plan and additional workup : SPLIT . AHI 15% and score at 4%

## 2014-04-22 DIAGNOSIS — M47817 Spondylosis without myelopathy or radiculopathy, lumbosacral region: Secondary | ICD-10-CM | POA: Diagnosis not present

## 2014-04-22 DIAGNOSIS — G4700 Insomnia, unspecified: Secondary | ICD-10-CM | POA: Diagnosis not present

## 2014-04-22 DIAGNOSIS — G894 Chronic pain syndrome: Secondary | ICD-10-CM | POA: Diagnosis not present

## 2014-05-06 ENCOUNTER — Encounter: Payer: Self-pay | Admitting: Women's Health

## 2014-05-06 ENCOUNTER — Ambulatory Visit (INDEPENDENT_AMBULATORY_CARE_PROVIDER_SITE_OTHER): Payer: Medicare Other | Admitting: Women's Health

## 2014-05-06 VITALS — BP 120/78 | Ht 61.5 in | Wt 154.0 lb

## 2014-05-06 DIAGNOSIS — R350 Frequency of micturition: Secondary | ICD-10-CM | POA: Diagnosis not present

## 2014-05-06 NOTE — Patient Instructions (Signed)
Health Recommendations for Postmenopausal Women Respected and ongoing research has looked at the most common causes of death, disability, and poor quality of life in postmenopausal women. The causes include heart disease, diseases of blood vessels, diabetes, depression, cancer, and bone loss (osteoporosis). Many things can be done to help lower the chances of developing these and other common problems: CARDIOVASCULAR DISEASE Heart Disease: A heart attack is a medical emergency. Know the signs and symptoms of a heart attack. Below are things women can do to reduce their risk for heart disease.   Do not smoke. If you smoke, quit.  Aim for a healthy weight. Being overweight causes many preventable deaths. Eat a healthy and balanced diet and drink an adequate amount of liquids.  Get moving. Make a commitment to be more physically active. Aim for 30 minutes of activity on most, if not all days of the week.  Eat for heart health. Choose a diet that is low in saturated fat and cholesterol and eliminate trans fat. Include whole grains, vegetables, and fruits. Read and understand the labels on food containers before buying.  Know your numbers. Ask your caregiver to check your blood pressure, cholesterol (total, HDL, LDL, triglycerides) and blood glucose. Work with your caregiver on improving your entire clinical picture.  High blood pressure. Limit or stop your table salt intake (try salt substitute and food seasonings). Avoid salty foods and drinks. Read labels on food containers before buying. Eating well and exercising can help control high blood pressure. STROKE  Stroke is a medical emergency. Stroke may be the result of a blood clot in a blood vessel in the brain or by a brain hemorrhage (bleeding). Know the signs and symptoms of a stroke. To lower the risk of developing a stroke:  Avoid fatty foods.  Quit smoking.  Control your diabetes, blood pressure, and irregular heart rate. THROMBOPHLEBITIS  (BLOOD CLOT) OF THE LEG  Becoming overweight and leading a stationary lifestyle may also contribute to developing blood clots. Controlling your diet and exercising will help lower the risk of developing blood clots. CANCER SCREENING  Breast Cancer: Take steps to reduce your risk of breast cancer.  You should practice "breast self-awareness." This means understanding the normal appearance and feel of your breasts and should include breast self-examination. Any changes detected, no matter how small, should be reported to your caregiver.  After age 40, you should have a clinical breast exam (CBE) every year.  Starting at age 40, you should consider having a mammogram (breast X-ray) every year.  If you have a family history of breast cancer, talk to your caregiver about genetic screening.  If you are at high risk for breast cancer, talk to your caregiver about having an MRI and a mammogram every year.  Intestinal or Stomach Cancer: Tests to consider are a rectal exam, fecal occult blood, sigmoidoscopy, and colonoscopy. Women who are high risk may need to be screened at an earlier age and more often.  Cervical Cancer:  Beginning at age 30, you should have a Pap test every 3 years as long as the past 3 Pap tests have been normal.  If you have had past treatment for cervical cancer or a condition that could lead to cancer, you need Pap tests and screening for cancer for at least 20 years after your treatment.  If you had a hysterectomy for a problem that was not cancer or a condition that could lead to cancer, then you no longer need Pap tests.    If you are between ages 65 and 70, and you have had normal Pap tests going back 10 years, you no longer need Pap tests.  If Pap tests have been discontinued, risk factors (such as a new sexual partner) need to be reassessed to determine if screening should be resumed.  Some medical problems can increase the chance of getting cervical cancer. In these  cases, your caregiver may recommend more frequent screening and Pap tests.  Uterine Cancer: If you have vaginal bleeding after reaching menopause, you should notify your caregiver.  Ovarian cancer: Other than yearly pelvic exams, there are no reliable tests available to screen for ovarian cancer at this time except for yearly pelvic exams.  Lung Cancer: Yearly chest X-rays can detect lung cancer and should be done on high risk women, such as cigarette smokers and women with chronic lung disease (emphysema).  Skin Cancer: A complete body skin exam should be done at your yearly examination. Avoid overexposure to the sun and ultraviolet light lamps. Use a strong sun block cream when in the sun. All of these things are important in lowering the risk of skin cancer. MENOPAUSE Menopause Symptoms: Hormone therapy products are effective for treating symptoms associated with menopause:  Moderate to severe hot flashes.  Night sweats.  Mood swings.  Headaches.  Tiredness.  Loss of sex drive.  Insomnia.  Other symptoms. Hormone replacement carries certain risks, especially in older women. Women who use or are thinking about using estrogen or estrogen with progestin treatments should discuss that with their caregiver. Your caregiver will help you understand the benefits and risks. The ideal dose of hormone replacement therapy is not known. The Food and Drug Administration (FDA) has concluded that hormone therapy should be used only at the lowest doses and for the shortest amount of time to reach treatment goals.  OSTEOPOROSIS Protecting Against Bone Loss and Preventing Fracture: If you use hormone therapy for prevention of bone loss (osteoporosis), the risks for bone loss must outweigh the risk of the therapy. Ask your caregiver about other medications known to be safe and effective for preventing bone loss and fractures. To guard against bone loss or fractures, the following is recommended:  If  you are less than age 50, take 1000 mg of calcium and at least 600 mg of Vitamin D per day.  If you are greater than age 50 but less than age 70, take 1200 mg of calcium and at least 600 mg of Vitamin D per day.  If you are greater than age 70, take 1200 mg of calcium and at least 800 mg of Vitamin D per day. Smoking and excessive alcohol intake increases the risk of osteoporosis. Eat foods rich in calcium and vitamin D and do weight bearing exercises several times a week as your caregiver suggests. DIABETES Diabetes Melitus: If you have Type I or Type 2 diabetes, you should keep your blood sugar under control with diet, exercise and recommended medication. Avoid too many sweets, starchy and fatty foods. Being overweight can make control more difficult. COGNITION AND MEMORY Cognition and Memory: Menopausal hormone therapy is not recommended for the prevention of cognitive disorders such as Alzheimer's disease or memory loss.  DEPRESSION  Depression may occur at any age, but is common in elderly women. The reasons may be because of physical, medical, social (loneliness), or financial problems and needs. If you are experiencing depression because of medical problems and control of symptoms, talk to your caregiver about this. Physical activity and   exercise may help with mood and sleep. Community and volunteer involvement may help your sense of value and worth. If you have depression and you feel that the problem is getting worse or becoming severe, talk to your caregiver about treatment options that are best for you. ACCIDENTS  Accidents are common and can be serious in the elderly woman. Prepare your house to prevent accidents. Eliminate throw rugs, place hand bars in the bath, shower and toilet areas. Avoid wearing high heeled shoes or walking on wet, snowy, and icy areas. Limit or stop driving if you have vision or hearing problems, or you feel you are unsteady with you movements and  reflexes. HEPATITIS C Hepatitis C is a type of viral infection affecting the liver. It is spread mainly through contact with blood from an infected person. It can be treated, but if left untreated, it can lead to severe liver damage over years. Many people who are infected do not know that the virus is in their blood. If you are a "baby-boomer", it is recommended that you have one screening test for Hepatitis C. IMMUNIZATIONS  Several immunizations are important to consider having during your senior years, including:   Tetanus, diptheria, and pertussis booster shot.  Influenza every year before the flu season begins.  Pneumonia vaccine.  Shingles vaccine.  Others as indicated based on your specific needs. Talk to your caregiver about these. Document Released: 12/16/2005 Document Revised: 10/10/2012 Document Reviewed: 08/11/2008 [facility] Patient Information 2015 [place], [place]. This information is not intended to replace advice given to you by your health care provider. Make sure you discuss any questions you have with your health care provider.

## 2014-05-06 NOTE — Progress Notes (Signed)
Stacy Parker Jul 28, 1937 [id number]    History:    Presents for breast and pelvic exam. TAH  BSO for menorrhagia on no HRT. History of normal Paps and mammograms. Has had hip and back issues over the past few years that are resolving. Not sexually active, husband history of prostate cancer. Current on vaccines, 2010 negative polyp - colonoscopy. Primary care manages labs, DEXA/osteopenia on no medications. Numerous basal and squamous skin cancer.  Past medical history, past surgical history, family history and social history were all reviewed and documented in the EPIC chart. 3 children all doing well. Has a 69-month-old standard poodle puppy. Moving to [employer] on the market.  ROS:  A  12 point ROS was performed and pertinent positives and negatives are included.  Exam:  Filed Vitals:   05/06/14 1402  BP: 120/78    General appearance:  Normal Thyroid:  Symmetrical, normal in size, without palpable masses or nodularity. Respiratory  Auscultation:  Clear without wheezing or rhonchi Cardiovascular  Auscultation:  Regular rate, without rubs, murmurs or gallops  Edema/varicosities:  Not grossly evident Abdominal  Soft,nontender, without masses, guarding or rebound.  Liver/spleen:  No organomegaly noted  Hernia:  None appreciated  Skin  Inspection:  Grossly normal   Breasts: Examined lying and sitting.     Right: Without masses, retractions, discharge or axillary adenopathy.     Left: Without masses, retractions, discharge or axillary adenopathy. Gentitourinary   Inguinal/mons:  Normal without inguinal adenopathy  External genitalia:  Normal  BUS/Urethra/Skene's glands:  Normal  Vagina:  Normal  Cervix:  Absent Uterus:  Absent  Adnexa/parametria:     Rt: Without masses or tenderness.   Lt: Without masses or tenderness.  Anus and perineum: Normal  Digital rectal exam: Normal sphincter tone without palpated masses or tenderness  Assessment/Plan:  80 y.o. MWF G3P3   For breast and pelvic exam.  TVH with BSO on no HRT Hypertension/osteopenia-primary care manages labs and meds Skin cancer-dermatologist Back and hip pain-orthopedist  Plan: SBE's, continue annual mammogram, calcium rich diet, vitamin D 2000 daily encouraged. Home safety and fall prevention discussed.   Note: This dictation was prepared with Dragon/digital dictation.  Any transcriptional errors that result are unintentional. Huel Cote [facility], 5:42 PM 05/06/2014

## 2014-06-02 ENCOUNTER — Ambulatory Visit (INDEPENDENT_AMBULATORY_CARE_PROVIDER_SITE_OTHER): Payer: Medicare Other | Admitting: Neurology

## 2014-06-02 DIAGNOSIS — G4737 Central sleep apnea in conditions classified elsewhere: Secondary | ICD-10-CM

## 2014-06-02 DIAGNOSIS — G4733 Obstructive sleep apnea (adult) (pediatric): Secondary | ICD-10-CM

## 2014-06-02 DIAGNOSIS — G4731 Primary central sleep apnea: Secondary | ICD-10-CM

## 2014-06-10 ENCOUNTER — Telehealth (INDEPENDENT_AMBULATORY_CARE_PROVIDER_SITE_OTHER): Payer: Self-pay

## 2014-06-10 ENCOUNTER — Telehealth: Payer: Self-pay | Admitting: Internal Medicine

## 2014-06-10 NOTE — Telephone Encounter (Signed)
Pt states she has been taking pantoprozole for about 2 years. Pt read an article and is concerned because she read that the med can cause problems with diarrhea, B12 issues and bone problems. She is taking it BID and tried to cut down to once a day but had problems with reflux. Pt would like to know your thoughts on this, if she should try to come off of the med. Please advise.

## 2014-06-10 NOTE — Telephone Encounter (Signed)
Spoke with pt and she is aware.

## 2014-06-10 NOTE — Telephone Encounter (Signed)
She needs to stay on pantoprazole because she has had severe damage to her esophagus. This protect her esophagus. Her other concerns are not problems that we encounter with any significance. At her age, no long-term worries.

## 2014-06-10 NOTE — Telephone Encounter (Signed)
Pt s/p hiatal hernia repair on 04/17/12. Pt states that she has been taking her Protonix since sx. Pt states that she has read a article, that said long term use can cause severe diarrhea and bone lose. Pt wants to know if Dr Hassell Done thinks that she should come off the Protonix. Pt denies having any stomach issues or amy symptoms at this time.  Informed pt that I would send Dr Hassell Done a message requesting this info and we would contact her back as soon as we received a message. Pt verbalized understanding.

## 2014-06-13 NOTE — Telephone Encounter (Signed)
Called and spoke to patient regarding taking Protonix Long Term.  Advised patient that per Dr. Excell Seltzer if data was read that long term use may  Cause bone loss or diarrhea there's a possibility of associated risk.  Advised patient that we have not had any patients with side effects from long term use.  Patient verbalized understandng.

## 2014-06-17 DIAGNOSIS — G4700 Insomnia, unspecified: Secondary | ICD-10-CM | POA: Diagnosis not present

## 2014-06-17 DIAGNOSIS — G894 Chronic pain syndrome: Secondary | ICD-10-CM | POA: Diagnosis not present

## 2014-06-17 DIAGNOSIS — M961 Postlaminectomy syndrome, not elsewhere classified: Secondary | ICD-10-CM | POA: Diagnosis not present

## 2014-07-04 ENCOUNTER — Telehealth: Payer: Self-pay | Admitting: Neurology

## 2014-07-04 NOTE — Telephone Encounter (Signed)
Discussed the patient's concern about the mask type causing an arteficially low oxygen level to be measured . This can be valid. She will stay on bipap and we will not use oxygen.

## 2014-07-04 NOTE — Telephone Encounter (Signed)
I called the patient to review the results of her sleep study.  She understands that she maintains her diagnosis of central sleep apnea and is recommended to continue her use of Bipap.  I also discussed with the patient that because her sleep study revealed low oxygen saturation, a cardiopulmonary evaluation could be considered, along with possible oxygen therapy.  The patient felt like that her low oxygen levels was because of the type of mask that was used during the sleep study.  She does not feel that she needs O2 or a consideration of a pulmonary evaluation.  She is requesting a personal call from Dr. Brett Fairy because her oxygen levels are usually normal and she does not agree with the finding from the test.

## 2014-07-24 DIAGNOSIS — Z85828 Personal history of other malignant neoplasm of skin: Secondary | ICD-10-CM | POA: Diagnosis not present

## 2014-07-24 DIAGNOSIS — L820 Inflamed seborrheic keratosis: Secondary | ICD-10-CM | POA: Diagnosis not present

## 2014-07-24 DIAGNOSIS — I789 Disease of capillaries, unspecified: Secondary | ICD-10-CM | POA: Diagnosis not present

## 2014-07-24 DIAGNOSIS — L918 Other hypertrophic disorders of the skin: Secondary | ICD-10-CM | POA: Diagnosis not present

## 2014-07-24 DIAGNOSIS — L908 Other atrophic disorders of skin: Secondary | ICD-10-CM | POA: Diagnosis not present

## 2014-07-24 DIAGNOSIS — L821 Other seborrheic keratosis: Secondary | ICD-10-CM | POA: Diagnosis not present

## 2014-07-24 DIAGNOSIS — L570 Actinic keratosis: Secondary | ICD-10-CM | POA: Diagnosis not present

## 2014-07-24 DIAGNOSIS — L819 Disorder of pigmentation, unspecified: Secondary | ICD-10-CM | POA: Diagnosis not present

## 2014-07-25 ENCOUNTER — Encounter: Payer: Self-pay | Admitting: Internal Medicine

## 2014-08-08 DIAGNOSIS — Z23 Encounter for immunization: Secondary | ICD-10-CM | POA: Diagnosis not present

## 2014-08-12 DIAGNOSIS — Z79891 Long term (current) use of opiate analgesic: Secondary | ICD-10-CM | POA: Diagnosis not present

## 2014-08-12 DIAGNOSIS — M25562 Pain in left knee: Secondary | ICD-10-CM | POA: Diagnosis not present

## 2014-08-12 DIAGNOSIS — G894 Chronic pain syndrome: Secondary | ICD-10-CM | POA: Diagnosis not present

## 2014-08-12 DIAGNOSIS — M961 Postlaminectomy syndrome, not elsewhere classified: Secondary | ICD-10-CM | POA: Diagnosis not present

## 2014-09-08 ENCOUNTER — Encounter: Payer: Self-pay | Admitting: Women's Health

## 2014-09-19 DIAGNOSIS — J029 Acute pharyngitis, unspecified: Secondary | ICD-10-CM | POA: Diagnosis not present

## 2014-09-23 DIAGNOSIS — J029 Acute pharyngitis, unspecified: Secondary | ICD-10-CM | POA: Diagnosis not present

## 2014-09-23 DIAGNOSIS — J040 Acute laryngitis: Secondary | ICD-10-CM | POA: Diagnosis not present

## 2014-10-07 DIAGNOSIS — Z79891 Long term (current) use of opiate analgesic: Secondary | ICD-10-CM | POA: Diagnosis not present

## 2014-10-07 DIAGNOSIS — F419 Anxiety disorder, unspecified: Secondary | ICD-10-CM | POA: Diagnosis not present

## 2014-10-07 DIAGNOSIS — G894 Chronic pain syndrome: Secondary | ICD-10-CM | POA: Diagnosis not present

## 2014-10-07 DIAGNOSIS — M961 Postlaminectomy syndrome, not elsewhere classified: Secondary | ICD-10-CM | POA: Diagnosis not present

## 2014-10-15 DIAGNOSIS — Z803 Family history of malignant neoplasm of breast: Secondary | ICD-10-CM | POA: Diagnosis not present

## 2014-10-15 DIAGNOSIS — Z1231 Encounter for screening mammogram for malignant neoplasm of breast: Secondary | ICD-10-CM | POA: Diagnosis not present

## 2014-10-28 DIAGNOSIS — M7741 Metatarsalgia, right foot: Secondary | ICD-10-CM | POA: Diagnosis not present

## 2014-10-28 DIAGNOSIS — M2012 Hallux valgus (acquired), left foot: Secondary | ICD-10-CM | POA: Diagnosis not present

## 2014-10-28 DIAGNOSIS — M2011 Hallux valgus (acquired), right foot: Secondary | ICD-10-CM | POA: Diagnosis not present

## 2014-10-28 DIAGNOSIS — M7742 Metatarsalgia, left foot: Secondary | ICD-10-CM | POA: Diagnosis not present

## 2014-11-10 DIAGNOSIS — J029 Acute pharyngitis, unspecified: Secondary | ICD-10-CM | POA: Diagnosis not present

## 2014-12-01 DIAGNOSIS — Z961 Presence of intraocular lens: Secondary | ICD-10-CM | POA: Diagnosis not present

## 2014-12-02 DIAGNOSIS — F419 Anxiety disorder, unspecified: Secondary | ICD-10-CM | POA: Diagnosis not present

## 2014-12-02 DIAGNOSIS — M961 Postlaminectomy syndrome, not elsewhere classified: Secondary | ICD-10-CM | POA: Diagnosis not present

## 2014-12-02 DIAGNOSIS — Z79891 Long term (current) use of opiate analgesic: Secondary | ICD-10-CM | POA: Diagnosis not present

## 2014-12-02 DIAGNOSIS — G894 Chronic pain syndrome: Secondary | ICD-10-CM | POA: Diagnosis not present

## 2014-12-04 ENCOUNTER — Other Ambulatory Visit: Payer: Self-pay | Admitting: Dermatology

## 2014-12-04 DIAGNOSIS — L720 Epidermal cyst: Secondary | ICD-10-CM | POA: Diagnosis not present

## 2014-12-04 DIAGNOSIS — I8312 Varicose veins of left lower extremity with inflammation: Secondary | ICD-10-CM | POA: Diagnosis not present

## 2014-12-04 DIAGNOSIS — L570 Actinic keratosis: Secondary | ICD-10-CM | POA: Diagnosis not present

## 2014-12-04 DIAGNOSIS — L814 Other melanin hyperpigmentation: Secondary | ICD-10-CM | POA: Diagnosis not present

## 2014-12-04 DIAGNOSIS — L821 Other seborrheic keratosis: Secondary | ICD-10-CM | POA: Diagnosis not present

## 2014-12-04 DIAGNOSIS — D485 Neoplasm of uncertain behavior of skin: Secondary | ICD-10-CM | POA: Diagnosis not present

## 2014-12-04 DIAGNOSIS — I8311 Varicose veins of right lower extremity with inflammation: Secondary | ICD-10-CM | POA: Diagnosis not present

## 2014-12-04 DIAGNOSIS — Z85828 Personal history of other malignant neoplasm of skin: Secondary | ICD-10-CM | POA: Diagnosis not present

## 2014-12-04 DIAGNOSIS — R233 Spontaneous ecchymoses: Secondary | ICD-10-CM | POA: Diagnosis not present

## 2014-12-04 DIAGNOSIS — D2262 Melanocytic nevi of left upper limb, including shoulder: Secondary | ICD-10-CM | POA: Diagnosis not present

## 2014-12-19 DIAGNOSIS — Z0000 Encounter for general adult medical examination without abnormal findings: Secondary | ICD-10-CM | POA: Diagnosis not present

## 2014-12-19 DIAGNOSIS — I10 Essential (primary) hypertension: Secondary | ICD-10-CM | POA: Diagnosis not present

## 2014-12-24 DIAGNOSIS — Z1389 Encounter for screening for other disorder: Secondary | ICD-10-CM | POA: Diagnosis not present

## 2014-12-24 DIAGNOSIS — Z0000 Encounter for general adult medical examination without abnormal findings: Secondary | ICD-10-CM | POA: Diagnosis not present

## 2014-12-24 DIAGNOSIS — M810 Age-related osteoporosis without current pathological fracture: Secondary | ICD-10-CM | POA: Diagnosis not present

## 2014-12-24 DIAGNOSIS — Z6823 Body mass index (BMI) 23.0-23.9, adult: Secondary | ICD-10-CM | POA: Diagnosis not present

## 2014-12-24 DIAGNOSIS — M545 Low back pain: Secondary | ICD-10-CM | POA: Diagnosis not present

## 2014-12-24 DIAGNOSIS — M5380 Other specified dorsopathies, site unspecified: Secondary | ICD-10-CM | POA: Diagnosis not present

## 2014-12-24 DIAGNOSIS — M1990 Unspecified osteoarthritis, unspecified site: Secondary | ICD-10-CM | POA: Diagnosis not present

## 2014-12-24 DIAGNOSIS — M419 Scoliosis, unspecified: Secondary | ICD-10-CM | POA: Diagnosis not present

## 2014-12-24 DIAGNOSIS — F418 Other specified anxiety disorders: Secondary | ICD-10-CM | POA: Diagnosis not present

## 2014-12-24 DIAGNOSIS — I10 Essential (primary) hypertension: Secondary | ICD-10-CM | POA: Diagnosis not present

## 2014-12-25 DIAGNOSIS — R8299 Other abnormal findings in urine: Secondary | ICD-10-CM | POA: Diagnosis not present

## 2014-12-25 DIAGNOSIS — R8290 Unspecified abnormal findings in urine: Secondary | ICD-10-CM | POA: Diagnosis not present

## 2014-12-25 DIAGNOSIS — I10 Essential (primary) hypertension: Secondary | ICD-10-CM | POA: Diagnosis not present

## 2015-01-20 DIAGNOSIS — M810 Age-related osteoporosis without current pathological fracture: Secondary | ICD-10-CM | POA: Diagnosis not present

## 2015-01-21 DIAGNOSIS — F419 Anxiety disorder, unspecified: Secondary | ICD-10-CM | POA: Diagnosis not present

## 2015-01-21 DIAGNOSIS — Z79891 Long term (current) use of opiate analgesic: Secondary | ICD-10-CM | POA: Diagnosis not present

## 2015-01-21 DIAGNOSIS — M961 Postlaminectomy syndrome, not elsewhere classified: Secondary | ICD-10-CM | POA: Diagnosis not present

## 2015-01-21 DIAGNOSIS — G894 Chronic pain syndrome: Secondary | ICD-10-CM | POA: Diagnosis not present

## 2015-02-03 DIAGNOSIS — G8929 Other chronic pain: Secondary | ICD-10-CM | POA: Diagnosis not present

## 2015-02-03 DIAGNOSIS — M545 Low back pain: Secondary | ICD-10-CM | POA: Diagnosis not present

## 2015-02-03 DIAGNOSIS — M791 Myalgia: Secondary | ICD-10-CM | POA: Diagnosis not present

## 2015-02-18 DIAGNOSIS — Z85828 Personal history of other malignant neoplasm of skin: Secondary | ICD-10-CM | POA: Diagnosis not present

## 2015-02-18 DIAGNOSIS — L905 Scar conditions and fibrosis of skin: Secondary | ICD-10-CM | POA: Diagnosis not present

## 2015-02-18 DIAGNOSIS — L570 Actinic keratosis: Secondary | ICD-10-CM | POA: Diagnosis not present

## 2015-03-04 DIAGNOSIS — G894 Chronic pain syndrome: Secondary | ICD-10-CM | POA: Diagnosis not present

## 2015-03-04 DIAGNOSIS — Z79891 Long term (current) use of opiate analgesic: Secondary | ICD-10-CM | POA: Diagnosis not present

## 2015-03-04 DIAGNOSIS — F419 Anxiety disorder, unspecified: Secondary | ICD-10-CM | POA: Diagnosis not present

## 2015-03-04 DIAGNOSIS — M961 Postlaminectomy syndrome, not elsewhere classified: Secondary | ICD-10-CM | POA: Diagnosis not present

## 2015-03-17 DIAGNOSIS — R490 Dysphonia: Secondary | ICD-10-CM | POA: Diagnosis not present

## 2015-03-31 ENCOUNTER — Telehealth: Payer: Self-pay | Admitting: Internal Medicine

## 2015-03-31 NOTE — Telephone Encounter (Signed)
Pt states she is taking pantoprazole 40mg  daily and she is concerned about what she had read regarding the PPI. She states her calcium level has been fine but she has heard that they can cause changes in the magnesium level. Pt wants to know what Dr. Henrene Pastor thinks about her taking the pantoprazole. Pt also wants to know if she should try to decrease the dose. Please advise.

## 2015-03-31 NOTE — Telephone Encounter (Signed)
I haven't seen her in years. Schedule routine OV to discuss in detail. Ok to continue until then. Thanks

## 2015-04-01 NOTE — Telephone Encounter (Signed)
Pt states she is not having any problems at this time. States she has an annual physical with her PCP and will discuss with him further.

## 2015-04-01 NOTE — Telephone Encounter (Signed)
Left message for pt to call back  °

## 2015-05-08 ENCOUNTER — Telehealth: Payer: Self-pay | Admitting: Neurology

## 2015-05-08 DIAGNOSIS — M533 Sacrococcygeal disorders, not elsewhere classified: Secondary | ICD-10-CM | POA: Diagnosis not present

## 2015-05-08 DIAGNOSIS — Z5181 Encounter for therapeutic drug level monitoring: Secondary | ICD-10-CM | POA: Diagnosis not present

## 2015-05-08 DIAGNOSIS — G894 Chronic pain syndrome: Secondary | ICD-10-CM | POA: Diagnosis not present

## 2015-05-08 DIAGNOSIS — Z79899 Other long term (current) drug therapy: Secondary | ICD-10-CM | POA: Diagnosis not present

## 2015-05-08 NOTE — Telephone Encounter (Signed)
Patient called and states she had a report sent to [facility] from [facility] and would like to know if she can come off the machine.  I let the patient know that Dohmeier was unavailable until July 11th but she would like a call from her nurse.

## 2015-05-12 NOTE — Telephone Encounter (Signed)
I have not received any documents from [place] regarding this patient. I contacted [facility] asking for the recent test results for this pt to be faxed to me. I attempted to call patient back but no answer, so I left a message asking her to call me back. There is not a f/u scheduled for this pt and she is due for one.

## 2015-05-12 NOTE — Telephone Encounter (Signed)
Spoke to pt. She explained to me that she brought her card into [place] for a download and wanted Dr. Brett Fairy to look at it and decide whether or not she should stay on her ASV. I explained to pt that I can pull the report from [facility] from 05/06/15 and show it to Dr. Brett Fairy when she is back in the office next week. I informed pt that Dr. Brett Fairy may order another sleep study to verify that her apnea is resolved, even if the report looks ok from the download. Pt verbalized understanding. Pt wanted Dr. Brett Fairy to know that she is no longer taking any opiates. I told pt that I wold call her back next week. Pt again verbalized understanding.

## 2015-05-14 ENCOUNTER — Encounter: Payer: Medicare Other | Admitting: Women's Health

## 2015-05-18 ENCOUNTER — Telehealth: Payer: Self-pay

## 2015-05-18 ENCOUNTER — Other Ambulatory Visit: Payer: Self-pay

## 2015-05-18 DIAGNOSIS — G4733 Obstructive sleep apnea (adult) (pediatric): Secondary | ICD-10-CM

## 2015-05-18 NOTE — Telephone Encounter (Signed)
Patient called returning Kristen's call. Patient can be reached at [phone]

## 2015-05-18 NOTE — Telephone Encounter (Signed)
Called pt to tell her that Dr. Brett Fairy reviewed her ASV download and said that it looks excellent. However, before she agrees to take the pt off of the ASV, she would like to repeat a sleep study with the pt off of the ASV to ensure that the pt has no apneas. Pt did not answer, left a message asking her to call me back.

## 2015-05-18 NOTE — Telephone Encounter (Signed)
Spoke to pt and informed her that Dr. Brett Fairy reviewed her ASV download and said it looks excellent. However, she wants to bring the pt in for a nocturnal PSG off of cpap to ensure pt has no apneas before deciding to take pt off of machine. Pt verbalized understanding.

## 2015-06-04 ENCOUNTER — Ambulatory Visit (INDEPENDENT_AMBULATORY_CARE_PROVIDER_SITE_OTHER): Payer: Medicare Other | Admitting: Women's Health

## 2015-06-04 ENCOUNTER — Encounter: Payer: Self-pay | Admitting: Women's Health

## 2015-06-04 VITALS — BP 118/78 | Ht 61.0 in | Wt 153.0 lb

## 2015-06-04 DIAGNOSIS — Z01419 Encounter for gynecological examination (general) (routine) without abnormal findings: Secondary | ICD-10-CM | POA: Diagnosis not present

## 2015-06-04 DIAGNOSIS — N952 Postmenopausal atrophic vaginitis: Secondary | ICD-10-CM | POA: Diagnosis not present

## 2015-06-04 NOTE — Progress Notes (Signed)
Stacy Parker 19-Feb-1934 [id number]    History:    Presents for breast and pelvic exam. TAH with BSO for menorrhagia on no HRT. Normal Pap and mammogram history negative breast biopsy 05/2013. Not sexually active, husbands health. Vaccines current. 2010 negative colon polyp. Has follow-up scheduled with GI, also having reflux. Numerous basal and squamous cell skin cancers, skin checks every 3 months.   Past medical history, past surgical history, family history and social history were all reviewed and documented in the EPIC chart. Lives at [facility] and has a home at [facility]  ROS:  A ROS was performed and pertinent positives and negatives are included.  Exam:  Filed Vitals:   06/04/15 1055  BP: 118/78    General appearance:  Normal Thyroid:  Symmetrical, normal in size, without palpable masses or nodularity. Respiratory  Auscultation:  Clear without wheezing or rhonchi Cardiovascular  Auscultation:  Regular rate, without rubs, murmurs or gallops  Edema/varicosities:  Not grossly evident Abdominal  Soft,nontender, without masses, guarding or rebound.  Liver/spleen:  No organomegaly noted  Hernia:  None appreciated  Skin  Inspection:  Grossly normal   Breasts: Examined lying and sitting.     Right: Without masses, retractions, discharge or axillary adenopathy.     Left: Without masses, retractions, discharge or axillary adenopathy. Gentitourinary   Inguinal/mons:  Normal without inguinal adenopathy  External genitalia:  Normal  BUS/Urethra/Skene's glands:  Normal  Vagina:  Normal  Cervix:  Uterus absent Adnexa/parametria:     Rt: Without masses or tenderness.   Lt: Without masses or tenderness.  Anus and perineum: Normal  Digital rectal exam: Normal sphincter tone without palpated masses or tenderness  Assessment/Plan:  81 y.o. MWF G3 P3 for breast and pelvic exam.  TAH with BSO for menorrhagia on no HRT Basal and squamous skin cancer-dermatologist  manages Reflux has follow-up scheduled with GI Primary care manages DEXA, labs and meds  Plan: SBE's, continue annual screening mammogram, calcium rich diet, vitamin D 1000 encouraged/reports normal vitamin D level at primary care. Home safety, fall prevention and importance of weightbearing exercise reviewed. Is having difficulty with some exercise due to hip pain.     Huel Cote WHNP, 1:22 PM 06/04/2015

## 2015-06-04 NOTE — Patient Instructions (Signed)
Health Recommendations for Postmenopausal Women Respected and ongoing research has looked at the most common causes of death, disability, and poor quality of life in postmenopausal women. The causes include heart disease, diseases of blood vessels, diabetes, depression, cancer, and bone loss (osteoporosis). Many things can be done to help lower the chances of developing these and other common problems. CARDIOVASCULAR DISEASE Heart Disease: A heart attack is a medical emergency. Know the signs and symptoms of a heart attack. Below are things women can do to reduce their risk for heart disease.   Do not smoke. If you smoke, quit.  Aim for a healthy weight. Being overweight causes many preventable deaths. Eat a healthy and balanced diet and drink an adequate amount of liquids.  Get moving. Make a commitment to be more physically active. Aim for 30 minutes of activity on most, if not all days of the week.  Eat for heart health. Choose a diet that is low in saturated fat and cholesterol and eliminate trans fat. Include whole grains, vegetables, and fruits. Read and understand the labels on food containers before buying.  Know your numbers. Ask your caregiver to check your blood pressure, cholesterol (total, HDL, LDL, triglycerides) and blood glucose. Work with your caregiver on improving your entire clinical picture.  High blood pressure. Limit or stop your table salt intake (try salt substitute and food seasonings). Avoid salty foods and drinks. Read labels on food containers before buying. Eating well and exercising can help control high blood pressure. STROKE  Stroke is a medical emergency. Stroke may be the result of a blood clot in a blood vessel in the brain or by a brain hemorrhage (bleeding). Know the signs and symptoms of a stroke. To lower the risk of developing a stroke:  Avoid fatty foods.  Quit smoking.  Control your diabetes, blood pressure, and irregular heart rate. THROMBOPHLEBITIS  (BLOOD CLOT) OF THE LEG  Becoming overweight and leading a stationary lifestyle may also contribute to developing blood clots. Controlling your diet and exercising will help lower the risk of developing blood clots. CANCER SCREENING  Breast Cancer: Take steps to reduce your risk of breast cancer.  You should practice "breast self-awareness." This means understanding the normal appearance and feel of your breasts and should include breast self-examination. Any changes detected, no matter how small, should be reported to your caregiver.  After age 40, you should have a clinical breast exam (CBE) every year.  Starting at age 40, you should consider having a mammogram (breast X-ray) every year.  If you have a family history of breast cancer, talk to your caregiver about genetic screening.  If you are at high risk for breast cancer, talk to your caregiver about having an MRI and a mammogram every year.  Intestinal or Stomach Cancer: Tests to consider are a rectal exam, fecal occult blood, sigmoidoscopy, and colonoscopy. Women who are high risk may need to be screened at an earlier age and more often.  Cervical Cancer:  Beginning at age 30, you should have a Pap test every 3 years as long as the past 3 Pap tests have been normal.  If you have had past treatment for cervical cancer or a condition that could lead to cancer, you need Pap tests and screening for cancer for at least 20 years after your treatment.  If you had a hysterectomy for a problem that was not cancer or a condition that could lead to cancer, then you no longer need Pap tests.    If you are between ages 65 and 70, and you have had normal Pap tests going back 10 years, you no longer need Pap tests.  If Pap tests have been discontinued, risk factors (such as a new sexual partner) need to be reassessed to determine if screening should be resumed.  Some medical problems can increase the chance of getting cervical cancer. In these  cases, your caregiver may recommend more frequent screening and Pap tests.  Uterine Cancer: If you have vaginal bleeding after reaching menopause, you should notify your caregiver.  Ovarian Cancer: Other than yearly pelvic exams, there are no reliable tests available to screen for ovarian cancer at this time except for yearly pelvic exams.  Lung Cancer: Yearly chest X-rays can detect lung cancer and should be done on high risk women, such as cigarette smokers and women with chronic lung disease (emphysema).  Skin Cancer: A complete body skin exam should be done at your yearly examination. Avoid overexposure to the sun and ultraviolet light lamps. Use a strong sun block cream when in the sun. All of these things are important for lowering the risk of skin cancer. MENOPAUSE Menopause Symptoms: Hormone therapy products are effective for treating symptoms associated with menopause:  Moderate to severe hot flashes.  Night sweats.  Mood swings.  Headaches.  Tiredness.  Loss of sex drive.  Insomnia.  Other symptoms. Hormone replacement carries certain risks, especially in older women. Women who use or are thinking about using estrogen or estrogen with progestin treatments should discuss that with their caregiver. Your caregiver will help you understand the benefits and risks. The ideal dose of hormone replacement therapy is not known. The Food and Drug Administration (FDA) has concluded that hormone therapy should be used only at the lowest doses and for the shortest amount of time to reach treatment goals.  OSTEOPOROSIS Protecting Against Bone Loss and Preventing Fracture If you use hormone therapy for prevention of bone loss (osteoporosis), the risks for bone loss must outweigh the risk of the therapy. Ask your caregiver about other medications known to be safe and effective for preventing bone loss and fractures. To guard against bone loss or fractures, the following is recommended:  If  you are younger than age 50, take 1000 mg of calcium and at least 600 mg of Vitamin D per day.  If you are older than age 50 but younger than age 70, take 1200 mg of calcium and at least 600 mg of Vitamin D per day.  If you are older than age 70, take 1200 mg of calcium and at least 800 mg of Vitamin D per day. Smoking and excessive alcohol intake increases the risk of osteoporosis. Eat foods rich in calcium and vitamin D and do weight bearing exercises several times a week as your caregiver suggests. DIABETES Diabetes Mellitus: If you have type I or type 2 diabetes, you should keep your blood sugar under control with diet, exercise, and recommended medication. Avoid starchy and fatty foods, and too many sweets. Being overweight can make diabetes control more difficult. COGNITION AND MEMORY Cognition and Memory: Menopausal hormone therapy is not recommended for the prevention of cognitive disorders such as Alzheimer's disease or memory loss.  DEPRESSION  Depression may occur at any age, but it is common in elderly women. This may be because of physical, medical, social (loneliness), or financial problems and needs. If you are experiencing depression because of medical problems and control of symptoms, talk to your caregiver about this. Physical   activity and exercise may help with mood and sleep. Community and volunteer involvement may improve your sense of value and worth. If you have depression and you feel that the problem is getting worse or becoming severe, talk to your caregiver about which treatment options are best for you. ACCIDENTS  Accidents are common and can be serious in elderly woman. Prepare your house to prevent accidents. Eliminate throw rugs, place hand bars in bath, shower, and toilet areas. Avoid wearing high heeled shoes or walking on wet, snowy, and icy areas. Limit or stop driving if you have vision or hearing problems, or if you feel you are unsteady with your movements and  reflexes. HEPATITIS C Hepatitis C is a type of viral infection affecting the liver. It is spread mainly through contact with blood from an infected person. It can be treated, but if left untreated, it can lead to severe liver damage over the years. Many people who are infected do not know that the virus is in their blood. If you are a "baby-boomer", it is recommended that you have one screening test for Hepatitis C. IMMUNIZATIONS  Several immunizations are important to consider having during your senior years, including:   Tetanus, diphtheria, and pertussis booster shot.  Influenza every year before the flu season begins.  Pneumonia vaccine.  Shingles vaccine.  Others, as indicated based on your specific needs. Talk to your caregiver about these. Document Released: 12/16/2005 Document Revised: 03/10/2014 Document Reviewed: 08/11/2008 ExitCare Patient Information 2015 [employer]. This information is not intended to replace advice given to you by your health care provider. Make sure you discuss any questions you have with your health care provider.  

## 2015-06-08 DIAGNOSIS — M533 Sacrococcygeal disorders, not elsewhere classified: Secondary | ICD-10-CM | POA: Diagnosis not present

## 2015-06-16 ENCOUNTER — Ambulatory Visit (INDEPENDENT_AMBULATORY_CARE_PROVIDER_SITE_OTHER): Payer: Medicare Other | Admitting: Neurology

## 2015-06-16 DIAGNOSIS — G4733 Obstructive sleep apnea (adult) (pediatric): Secondary | ICD-10-CM | POA: Diagnosis not present

## 2015-06-16 NOTE — Sleep Study (Signed)
Please see the scanned sleep study interpretation located in the Procedure tab within the Chart Review section. 

## 2015-06-24 ENCOUNTER — Other Ambulatory Visit: Payer: Self-pay

## 2015-06-24 DIAGNOSIS — K219 Gastro-esophageal reflux disease without esophagitis: Secondary | ICD-10-CM

## 2015-06-24 DIAGNOSIS — K2100 Gastro-esophageal reflux disease with esophagitis, without bleeding: Secondary | ICD-10-CM

## 2015-06-25 ENCOUNTER — Telehealth: Payer: Self-pay

## 2015-06-25 DIAGNOSIS — Z85828 Personal history of other malignant neoplasm of skin: Secondary | ICD-10-CM | POA: Diagnosis not present

## 2015-06-25 DIAGNOSIS — L820 Inflamed seborrheic keratosis: Secondary | ICD-10-CM | POA: Diagnosis not present

## 2015-06-25 DIAGNOSIS — L570 Actinic keratosis: Secondary | ICD-10-CM | POA: Diagnosis not present

## 2015-06-25 DIAGNOSIS — L298 Other pruritus: Secondary | ICD-10-CM | POA: Diagnosis not present

## 2015-06-25 NOTE — Telephone Encounter (Signed)
error 

## 2015-06-25 NOTE — Telephone Encounter (Signed)
I advised pt of her sleep study results, and that osa was seen in her study. I spoke to Dr. Brett Fairy about needing to bring the pt in for a pap titration study as she recommneded in the report, even though the pt is already on the ASV. Dr. Brett Fairy said to keep pt on the ASV and keep the current settings. Pt was advised of this recommendation. Pt verbalized understanding. I offered an f/u appt with Dr. Brett Fairy and she declined at this time.

## 2015-06-25 NOTE — Telephone Encounter (Signed)
Called pt to discuss sleep study results. No answer, left a message asking pt to call me back.  Dr. Brett Fairy said to ask pt to keep her ASV settings as are. Her sleep study showed that she still had osa and her last ASV download was excellent.

## 2015-06-26 ENCOUNTER — Other Ambulatory Visit: Payer: Medicare Other

## 2015-06-29 DIAGNOSIS — G894 Chronic pain syndrome: Secondary | ICD-10-CM | POA: Diagnosis not present

## 2015-06-30 ENCOUNTER — Ambulatory Visit
Admission: RE | Admit: 2015-06-30 | Discharge: 2015-06-30 | Disposition: A | Discharge status: Home / Self Care | Payer: Medicare Other | Source: Ambulatory Visit | Attending: Surgery | Admitting: Surgery

## 2015-06-30 DIAGNOSIS — K449 Diaphragmatic hernia without obstruction or gangrene: Secondary | ICD-10-CM | POA: Diagnosis not present

## 2015-06-30 DIAGNOSIS — K224 Dyskinesia of esophagus: Secondary | ICD-10-CM | POA: Diagnosis not present

## 2015-08-05 DIAGNOSIS — K219 Gastro-esophageal reflux disease without esophagitis: Secondary | ICD-10-CM | POA: Diagnosis not present

## 2015-08-31 DIAGNOSIS — F419 Anxiety disorder, unspecified: Secondary | ICD-10-CM | POA: Diagnosis not present

## 2015-08-31 DIAGNOSIS — M961 Postlaminectomy syndrome, not elsewhere classified: Secondary | ICD-10-CM | POA: Diagnosis not present

## 2015-08-31 DIAGNOSIS — G894 Chronic pain syndrome: Secondary | ICD-10-CM | POA: Diagnosis not present

## 2015-09-09 DIAGNOSIS — G894 Chronic pain syndrome: Secondary | ICD-10-CM | POA: Diagnosis not present

## 2015-09-10 ENCOUNTER — Telehealth: Payer: Self-pay | Admitting: Neurology

## 2015-09-10 ENCOUNTER — Telehealth: Payer: Self-pay

## 2015-09-10 NOTE — Telephone Encounter (Signed)
Patient is calling to discuss her CPAP machine. She states the red or orange light where the water is stays on all the time. Once while using the CPAP it stopped because it got too hot. Please call and discuss. Thank you.

## 2015-09-10 NOTE — Telephone Encounter (Signed)
Spoke with patient regarding cpap machine. I wasn't sure why the red and yellow light stayed on. Sounds like the humidifier is not working properly. She spoke with [facility] and all their techs were out of office til tomorrow. She is leaving on trip in the morning. I gave her resmed tech support number to call. She will call me back if they do not help.

## 2015-09-24 NOTE — Telephone Encounter (Signed)
Error

## 2015-10-06 ENCOUNTER — Telehealth: Payer: Self-pay | Admitting: Internal Medicine

## 2015-10-06 NOTE — Telephone Encounter (Signed)
Pt had an incomplete colon done in Jan 2010. Barium enema was done. Pt is calling wanting to know if she needs to have another colon, pt states she is 32 and she wants to make sure. Please advise.

## 2015-10-06 NOTE — Telephone Encounter (Signed)
Left message for pt to call back  °

## 2015-10-06 NOTE — Telephone Encounter (Signed)
No routine follow-up required. Thank her for checking.

## 2015-10-07 NOTE — Telephone Encounter (Signed)
Pt aware.

## 2015-10-07 NOTE — Telephone Encounter (Signed)
Left message for pt to call back  °

## 2015-10-15 ENCOUNTER — Ambulatory Visit: Payer: Self-pay | Admitting: Internal Medicine

## 2015-10-22 DIAGNOSIS — Z1231 Encounter for screening mammogram for malignant neoplasm of breast: Secondary | ICD-10-CM | POA: Diagnosis not present

## 2015-10-22 DIAGNOSIS — Z803 Family history of malignant neoplasm of breast: Secondary | ICD-10-CM | POA: Diagnosis not present

## 2015-10-29 DIAGNOSIS — L570 Actinic keratosis: Secondary | ICD-10-CM | POA: Diagnosis not present

## 2015-10-29 DIAGNOSIS — Z85828 Personal history of other malignant neoplasm of skin: Secondary | ICD-10-CM | POA: Diagnosis not present

## 2015-10-29 DIAGNOSIS — T85112A Breakdown (mechanical) of implanted electronic neurostimulator (electrode) of spinal cord, initial encounter: Secondary | ICD-10-CM | POA: Diagnosis not present

## 2015-10-29 DIAGNOSIS — G894 Chronic pain syndrome: Secondary | ICD-10-CM | POA: Diagnosis not present

## 2015-11-20 DIAGNOSIS — M1611 Unilateral primary osteoarthritis, right hip: Secondary | ICD-10-CM | POA: Diagnosis not present

## 2015-11-20 DIAGNOSIS — M25551 Pain in right hip: Secondary | ICD-10-CM | POA: Diagnosis not present

## 2015-11-23 DIAGNOSIS — Z01818 Encounter for other preprocedural examination: Secondary | ICD-10-CM | POA: Diagnosis not present

## 2015-11-23 DIAGNOSIS — G894 Chronic pain syndrome: Secondary | ICD-10-CM | POA: Diagnosis not present

## 2015-11-23 DIAGNOSIS — T85112D Breakdown (mechanical) of implanted electronic neurostimulator (electrode) of spinal cord, subsequent encounter: Secondary | ICD-10-CM | POA: Diagnosis not present

## 2015-12-01 DIAGNOSIS — M961 Postlaminectomy syndrome, not elsewhere classified: Secondary | ICD-10-CM | POA: Diagnosis not present

## 2015-12-01 DIAGNOSIS — G894 Chronic pain syndrome: Secondary | ICD-10-CM | POA: Diagnosis not present

## 2015-12-01 DIAGNOSIS — T84112A Breakdown (mechanical) of internal fixation device of bone of right forearm, initial encounter: Secondary | ICD-10-CM | POA: Diagnosis not present

## 2015-12-01 DIAGNOSIS — T85113A Breakdown (mechanical) of implanted electronic neurostimulator, generator, initial encounter: Secondary | ICD-10-CM | POA: Diagnosis not present

## 2015-12-01 DIAGNOSIS — K449 Diaphragmatic hernia without obstruction or gangrene: Secondary | ICD-10-CM | POA: Diagnosis not present

## 2015-12-01 DIAGNOSIS — Z79899 Other long term (current) drug therapy: Secondary | ICD-10-CM | POA: Diagnosis not present

## 2015-12-01 DIAGNOSIS — I10 Essential (primary) hypertension: Secondary | ICD-10-CM | POA: Diagnosis not present

## 2015-12-01 DIAGNOSIS — T85112A Breakdown (mechanical) of implanted electronic neurostimulator (electrode) of spinal cord, initial encounter: Secondary | ICD-10-CM | POA: Diagnosis not present

## 2015-12-01 DIAGNOSIS — R509 Fever, unspecified: Secondary | ICD-10-CM | POA: Diagnosis not present

## 2015-12-07 DIAGNOSIS — Z961 Presence of intraocular lens: Secondary | ICD-10-CM | POA: Diagnosis not present

## 2015-12-08 DIAGNOSIS — M961 Postlaminectomy syndrome, not elsewhere classified: Secondary | ICD-10-CM | POA: Diagnosis not present

## 2015-12-08 DIAGNOSIS — F419 Anxiety disorder, unspecified: Secondary | ICD-10-CM | POA: Diagnosis not present

## 2015-12-08 DIAGNOSIS — G4700 Insomnia, unspecified: Secondary | ICD-10-CM | POA: Diagnosis not present

## 2015-12-08 DIAGNOSIS — G894 Chronic pain syndrome: Secondary | ICD-10-CM | POA: Diagnosis not present

## 2015-12-23 DIAGNOSIS — I8290 Acute embolism and thrombosis of unspecified vein: Secondary | ICD-10-CM | POA: Diagnosis not present

## 2015-12-23 DIAGNOSIS — M810 Age-related osteoporosis without current pathological fracture: Secondary | ICD-10-CM | POA: Diagnosis not present

## 2015-12-23 DIAGNOSIS — I10 Essential (primary) hypertension: Secondary | ICD-10-CM | POA: Diagnosis not present

## 2015-12-23 DIAGNOSIS — Z008 Encounter for other general examination: Secondary | ICD-10-CM | POA: Diagnosis not present

## 2015-12-29 DIAGNOSIS — Z6828 Body mass index (BMI) 28.0-28.9, adult: Secondary | ICD-10-CM | POA: Diagnosis not present

## 2015-12-29 DIAGNOSIS — T85112D Breakdown (mechanical) of implanted electronic neurostimulator (electrode) of spinal cord, subsequent encounter: Secondary | ICD-10-CM | POA: Diagnosis not present

## 2015-12-29 DIAGNOSIS — M961 Postlaminectomy syndrome, not elsewhere classified: Secondary | ICD-10-CM | POA: Diagnosis not present

## 2015-12-29 DIAGNOSIS — G894 Chronic pain syndrome: Secondary | ICD-10-CM | POA: Diagnosis not present

## 2015-12-31 DIAGNOSIS — M5380 Other specified dorsopathies, site unspecified: Secondary | ICD-10-CM | POA: Diagnosis not present

## 2015-12-31 DIAGNOSIS — I10 Essential (primary) hypertension: Secondary | ICD-10-CM | POA: Diagnosis not present

## 2015-12-31 DIAGNOSIS — M419 Scoliosis, unspecified: Secondary | ICD-10-CM | POA: Diagnosis not present

## 2015-12-31 DIAGNOSIS — Z1389 Encounter for screening for other disorder: Secondary | ICD-10-CM | POA: Diagnosis not present

## 2015-12-31 DIAGNOSIS — M810 Age-related osteoporosis without current pathological fracture: Secondary | ICD-10-CM | POA: Diagnosis not present

## 2015-12-31 DIAGNOSIS — Z0000 Encounter for general adult medical examination without abnormal findings: Secondary | ICD-10-CM | POA: Diagnosis not present

## 2015-12-31 DIAGNOSIS — Z6823 Body mass index (BMI) 23.0-23.9, adult: Secondary | ICD-10-CM | POA: Diagnosis not present

## 2015-12-31 DIAGNOSIS — K219 Gastro-esophageal reflux disease without esophagitis: Secondary | ICD-10-CM | POA: Diagnosis not present

## 2015-12-31 DIAGNOSIS — M1990 Unspecified osteoarthritis, unspecified site: Secondary | ICD-10-CM | POA: Diagnosis not present

## 2016-01-12 DIAGNOSIS — J209 Acute bronchitis, unspecified: Secondary | ICD-10-CM | POA: Diagnosis not present

## 2016-01-12 DIAGNOSIS — Z6824 Body mass index (BMI) 24.0-24.9, adult: Secondary | ICD-10-CM | POA: Diagnosis not present

## 2016-01-12 DIAGNOSIS — I10 Essential (primary) hypertension: Secondary | ICD-10-CM | POA: Diagnosis not present

## 2016-02-01 ENCOUNTER — Telehealth: Payer: Self-pay | Admitting: Neurology

## 2016-02-01 NOTE — Telephone Encounter (Signed)
Spoke to pt and advised her that Dr. Brett Fairy is unfamiliar with the ozone cleaning in the cpap. Pt says Dr. Brett Fairy should review [email or web address] in her spare time.

## 2016-02-01 NOTE — Telephone Encounter (Signed)
Pt called inquiring if Dr Brett Fairy is familiar with "So Clean" CPAP cleaning machine. She said it uses ozone, it cost $300. She would like some advice. I relayed to pt she has not been seen since 2015, she said she was not interested in an appointment, she just wanted to know if Dr Dohmeier had any knowledge about this machine.

## 2016-02-01 NOTE — Telephone Encounter (Signed)
Never known about ozone cleaning in CPAP !

## 2016-02-02 DIAGNOSIS — R05 Cough: Secondary | ICD-10-CM | POA: Diagnosis not present

## 2016-02-02 DIAGNOSIS — Z6823 Body mass index (BMI) 23.0-23.9, adult: Secondary | ICD-10-CM | POA: Diagnosis not present

## 2016-02-08 DIAGNOSIS — M810 Age-related osteoporosis without current pathological fracture: Secondary | ICD-10-CM | POA: Diagnosis not present

## 2016-02-11 DIAGNOSIS — L814 Other melanin hyperpigmentation: Secondary | ICD-10-CM | POA: Diagnosis not present

## 2016-02-11 DIAGNOSIS — Z85828 Personal history of other malignant neoplasm of skin: Secondary | ICD-10-CM | POA: Diagnosis not present

## 2016-02-11 DIAGNOSIS — I8391 Asymptomatic varicose veins of right lower extremity: Secondary | ICD-10-CM | POA: Diagnosis not present

## 2016-02-11 DIAGNOSIS — L821 Other seborrheic keratosis: Secondary | ICD-10-CM | POA: Diagnosis not present

## 2016-02-11 DIAGNOSIS — L820 Inflamed seborrheic keratosis: Secondary | ICD-10-CM | POA: Diagnosis not present

## 2016-03-04 DIAGNOSIS — W010XXA Fall on same level from slipping, tripping and stumbling without subsequent striking against object, initial encounter: Secondary | ICD-10-CM | POA: Diagnosis not present

## 2016-03-04 DIAGNOSIS — S9031XA Contusion of right foot, initial encounter: Secondary | ICD-10-CM | POA: Diagnosis not present

## 2016-03-04 DIAGNOSIS — S8001XA Contusion of right knee, initial encounter: Secondary | ICD-10-CM | POA: Diagnosis not present

## 2016-03-04 DIAGNOSIS — S8991XA Unspecified injury of right lower leg, initial encounter: Secondary | ICD-10-CM | POA: Diagnosis not present

## 2016-03-04 DIAGNOSIS — S92324A Nondisplaced fracture of second metatarsal bone, right foot, initial encounter for closed fracture: Secondary | ICD-10-CM | POA: Diagnosis not present

## 2016-03-04 DIAGNOSIS — M25561 Pain in right knee: Secondary | ICD-10-CM | POA: Diagnosis not present

## 2016-03-04 DIAGNOSIS — M79671 Pain in right foot: Secondary | ICD-10-CM | POA: Diagnosis not present

## 2016-03-11 ENCOUNTER — Other Ambulatory Visit: Payer: Self-pay | Admitting: Orthopedic Surgery

## 2016-03-11 DIAGNOSIS — S92313A Displaced fracture of first metatarsal bone, unspecified foot, initial encounter for closed fracture: Secondary | ICD-10-CM | POA: Diagnosis not present

## 2016-03-11 DIAGNOSIS — S92323A Displaced fracture of second metatarsal bone, unspecified foot, initial encounter for closed fracture: Secondary | ICD-10-CM | POA: Diagnosis not present

## 2016-03-11 DIAGNOSIS — S92231A Displaced fracture of intermediate cuneiform of right foot, initial encounter for closed fracture: Secondary | ICD-10-CM | POA: Diagnosis not present

## 2016-03-11 DIAGNOSIS — S92221A Displaced fracture of lateral cuneiform of right foot, initial encounter for closed fracture: Secondary | ICD-10-CM | POA: Diagnosis not present

## 2016-03-18 DIAGNOSIS — S92231D Displaced fracture of intermediate cuneiform of right foot, subsequent encounter for fracture with routine healing: Secondary | ICD-10-CM | POA: Diagnosis not present

## 2016-03-21 DIAGNOSIS — S92231D Displaced fracture of intermediate cuneiform of right foot, subsequent encounter for fracture with routine healing: Secondary | ICD-10-CM | POA: Diagnosis not present

## 2016-03-30 DIAGNOSIS — G894 Chronic pain syndrome: Secondary | ICD-10-CM | POA: Diagnosis not present

## 2016-03-30 DIAGNOSIS — F419 Anxiety disorder, unspecified: Secondary | ICD-10-CM | POA: Diagnosis not present

## 2016-03-30 DIAGNOSIS — G4700 Insomnia, unspecified: Secondary | ICD-10-CM | POA: Diagnosis not present

## 2016-03-30 DIAGNOSIS — M961 Postlaminectomy syndrome, not elsewhere classified: Secondary | ICD-10-CM | POA: Diagnosis not present

## 2016-04-01 DIAGNOSIS — S92231D Displaced fracture of intermediate cuneiform of right foot, subsequent encounter for fracture with routine healing: Secondary | ICD-10-CM | POA: Diagnosis not present

## 2016-04-06 DIAGNOSIS — S92231D Displaced fracture of intermediate cuneiform of right foot, subsequent encounter for fracture with routine healing: Secondary | ICD-10-CM | POA: Diagnosis not present

## 2016-04-06 DIAGNOSIS — R262 Difficulty in walking, not elsewhere classified: Secondary | ICD-10-CM | POA: Diagnosis not present

## 2016-04-06 DIAGNOSIS — Z9181 History of falling: Secondary | ICD-10-CM | POA: Diagnosis not present

## 2016-04-06 DIAGNOSIS — M6281 Muscle weakness (generalized): Secondary | ICD-10-CM | POA: Diagnosis not present

## 2016-04-06 DIAGNOSIS — M79671 Pain in right foot: Secondary | ICD-10-CM | POA: Diagnosis not present

## 2016-04-07 DIAGNOSIS — M79671 Pain in right foot: Secondary | ICD-10-CM | POA: Diagnosis not present

## 2016-04-07 DIAGNOSIS — H353131 Nonexudative age-related macular degeneration, bilateral, early dry stage: Secondary | ICD-10-CM | POA: Diagnosis not present

## 2016-04-07 DIAGNOSIS — S92231D Displaced fracture of intermediate cuneiform of right foot, subsequent encounter for fracture with routine healing: Secondary | ICD-10-CM | POA: Diagnosis not present

## 2016-04-07 DIAGNOSIS — Z9181 History of falling: Secondary | ICD-10-CM | POA: Diagnosis not present

## 2016-04-07 DIAGNOSIS — M6281 Muscle weakness (generalized): Secondary | ICD-10-CM | POA: Diagnosis not present

## 2016-04-07 DIAGNOSIS — R262 Difficulty in walking, not elsewhere classified: Secondary | ICD-10-CM | POA: Diagnosis not present

## 2016-04-11 DIAGNOSIS — S92231D Displaced fracture of intermediate cuneiform of right foot, subsequent encounter for fracture with routine healing: Secondary | ICD-10-CM | POA: Diagnosis not present

## 2016-04-11 DIAGNOSIS — Z9181 History of falling: Secondary | ICD-10-CM | POA: Diagnosis not present

## 2016-04-11 DIAGNOSIS — R262 Difficulty in walking, not elsewhere classified: Secondary | ICD-10-CM | POA: Diagnosis not present

## 2016-04-11 DIAGNOSIS — M6281 Muscle weakness (generalized): Secondary | ICD-10-CM | POA: Diagnosis not present

## 2016-04-11 DIAGNOSIS — M79671 Pain in right foot: Secondary | ICD-10-CM | POA: Diagnosis not present

## 2016-04-13 DIAGNOSIS — R262 Difficulty in walking, not elsewhere classified: Secondary | ICD-10-CM | POA: Diagnosis not present

## 2016-04-13 DIAGNOSIS — M6281 Muscle weakness (generalized): Secondary | ICD-10-CM | POA: Diagnosis not present

## 2016-04-13 DIAGNOSIS — M79671 Pain in right foot: Secondary | ICD-10-CM | POA: Diagnosis not present

## 2016-04-13 DIAGNOSIS — S92231D Displaced fracture of intermediate cuneiform of right foot, subsequent encounter for fracture with routine healing: Secondary | ICD-10-CM | POA: Diagnosis not present

## 2016-04-13 DIAGNOSIS — Z9181 History of falling: Secondary | ICD-10-CM | POA: Diagnosis not present

## 2016-04-14 DIAGNOSIS — Z9181 History of falling: Secondary | ICD-10-CM | POA: Diagnosis not present

## 2016-04-14 DIAGNOSIS — S92231D Displaced fracture of intermediate cuneiform of right foot, subsequent encounter for fracture with routine healing: Secondary | ICD-10-CM | POA: Diagnosis not present

## 2016-04-14 DIAGNOSIS — M6281 Muscle weakness (generalized): Secondary | ICD-10-CM | POA: Diagnosis not present

## 2016-04-14 DIAGNOSIS — M79671 Pain in right foot: Secondary | ICD-10-CM | POA: Diagnosis not present

## 2016-04-14 DIAGNOSIS — R262 Difficulty in walking, not elsewhere classified: Secondary | ICD-10-CM | POA: Diagnosis not present

## 2016-04-18 DIAGNOSIS — M6281 Muscle weakness (generalized): Secondary | ICD-10-CM | POA: Diagnosis not present

## 2016-04-18 DIAGNOSIS — M79671 Pain in right foot: Secondary | ICD-10-CM | POA: Diagnosis not present

## 2016-04-18 DIAGNOSIS — R262 Difficulty in walking, not elsewhere classified: Secondary | ICD-10-CM | POA: Diagnosis not present

## 2016-04-18 DIAGNOSIS — S92231D Displaced fracture of intermediate cuneiform of right foot, subsequent encounter for fracture with routine healing: Secondary | ICD-10-CM | POA: Diagnosis not present

## 2016-04-18 DIAGNOSIS — Z9181 History of falling: Secondary | ICD-10-CM | POA: Diagnosis not present

## 2016-04-19 DIAGNOSIS — G4731 Primary central sleep apnea: Secondary | ICD-10-CM | POA: Diagnosis not present

## 2016-04-19 DIAGNOSIS — H35372 Puckering of macula, left eye: Secondary | ICD-10-CM | POA: Diagnosis not present

## 2016-04-19 DIAGNOSIS — H43812 Vitreous degeneration, left eye: Secondary | ICD-10-CM | POA: Diagnosis not present

## 2016-04-20 DIAGNOSIS — S92231D Displaced fracture of intermediate cuneiform of right foot, subsequent encounter for fracture with routine healing: Secondary | ICD-10-CM | POA: Diagnosis not present

## 2016-04-20 DIAGNOSIS — M6281 Muscle weakness (generalized): Secondary | ICD-10-CM | POA: Diagnosis not present

## 2016-04-20 DIAGNOSIS — M79671 Pain in right foot: Secondary | ICD-10-CM | POA: Diagnosis not present

## 2016-04-20 DIAGNOSIS — R262 Difficulty in walking, not elsewhere classified: Secondary | ICD-10-CM | POA: Diagnosis not present

## 2016-04-20 DIAGNOSIS — Z9181 History of falling: Secondary | ICD-10-CM | POA: Diagnosis not present

## 2016-04-22 DIAGNOSIS — Z9181 History of falling: Secondary | ICD-10-CM | POA: Diagnosis not present

## 2016-04-22 DIAGNOSIS — M79671 Pain in right foot: Secondary | ICD-10-CM | POA: Diagnosis not present

## 2016-04-22 DIAGNOSIS — S92231D Displaced fracture of intermediate cuneiform of right foot, subsequent encounter for fracture with routine healing: Secondary | ICD-10-CM | POA: Diagnosis not present

## 2016-04-22 DIAGNOSIS — M6281 Muscle weakness (generalized): Secondary | ICD-10-CM | POA: Diagnosis not present

## 2016-04-22 DIAGNOSIS — R262 Difficulty in walking, not elsewhere classified: Secondary | ICD-10-CM | POA: Diagnosis not present

## 2016-04-25 DIAGNOSIS — S92231D Displaced fracture of intermediate cuneiform of right foot, subsequent encounter for fracture with routine healing: Secondary | ICD-10-CM | POA: Diagnosis not present

## 2016-04-25 DIAGNOSIS — R262 Difficulty in walking, not elsewhere classified: Secondary | ICD-10-CM | POA: Diagnosis not present

## 2016-04-25 DIAGNOSIS — Z9181 History of falling: Secondary | ICD-10-CM | POA: Diagnosis not present

## 2016-04-25 DIAGNOSIS — M6281 Muscle weakness (generalized): Secondary | ICD-10-CM | POA: Diagnosis not present

## 2016-04-25 DIAGNOSIS — M79671 Pain in right foot: Secondary | ICD-10-CM | POA: Diagnosis not present

## 2016-04-27 DIAGNOSIS — M6281 Muscle weakness (generalized): Secondary | ICD-10-CM | POA: Diagnosis not present

## 2016-04-27 DIAGNOSIS — M79671 Pain in right foot: Secondary | ICD-10-CM | POA: Diagnosis not present

## 2016-04-27 DIAGNOSIS — R262 Difficulty in walking, not elsewhere classified: Secondary | ICD-10-CM | POA: Diagnosis not present

## 2016-04-27 DIAGNOSIS — S92231D Displaced fracture of intermediate cuneiform of right foot, subsequent encounter for fracture with routine healing: Secondary | ICD-10-CM | POA: Diagnosis not present

## 2016-04-27 DIAGNOSIS — Z9181 History of falling: Secondary | ICD-10-CM | POA: Diagnosis not present

## 2016-04-28 DIAGNOSIS — Z9181 History of falling: Secondary | ICD-10-CM | POA: Diagnosis not present

## 2016-04-28 DIAGNOSIS — R262 Difficulty in walking, not elsewhere classified: Secondary | ICD-10-CM | POA: Diagnosis not present

## 2016-04-28 DIAGNOSIS — M6281 Muscle weakness (generalized): Secondary | ICD-10-CM | POA: Diagnosis not present

## 2016-04-28 DIAGNOSIS — M79671 Pain in right foot: Secondary | ICD-10-CM | POA: Diagnosis not present

## 2016-04-28 DIAGNOSIS — S92231D Displaced fracture of intermediate cuneiform of right foot, subsequent encounter for fracture with routine healing: Secondary | ICD-10-CM | POA: Diagnosis not present

## 2016-05-02 DIAGNOSIS — S92231D Displaced fracture of intermediate cuneiform of right foot, subsequent encounter for fracture with routine healing: Secondary | ICD-10-CM | POA: Diagnosis not present

## 2016-05-03 DIAGNOSIS — S92231D Displaced fracture of intermediate cuneiform of right foot, subsequent encounter for fracture with routine healing: Secondary | ICD-10-CM | POA: Diagnosis not present

## 2016-05-03 DIAGNOSIS — M6281 Muscle weakness (generalized): Secondary | ICD-10-CM | POA: Diagnosis not present

## 2016-05-03 DIAGNOSIS — R262 Difficulty in walking, not elsewhere classified: Secondary | ICD-10-CM | POA: Diagnosis not present

## 2016-05-03 DIAGNOSIS — Z9181 History of falling: Secondary | ICD-10-CM | POA: Diagnosis not present

## 2016-05-03 DIAGNOSIS — M79671 Pain in right foot: Secondary | ICD-10-CM | POA: Diagnosis not present

## 2016-05-04 DIAGNOSIS — H35372 Puckering of macula, left eye: Secondary | ICD-10-CM | POA: Diagnosis not present

## 2016-05-05 DIAGNOSIS — R262 Difficulty in walking, not elsewhere classified: Secondary | ICD-10-CM | POA: Diagnosis not present

## 2016-05-05 DIAGNOSIS — Z9181 History of falling: Secondary | ICD-10-CM | POA: Diagnosis not present

## 2016-05-05 DIAGNOSIS — S92231D Displaced fracture of intermediate cuneiform of right foot, subsequent encounter for fracture with routine healing: Secondary | ICD-10-CM | POA: Diagnosis not present

## 2016-05-05 DIAGNOSIS — M6281 Muscle weakness (generalized): Secondary | ICD-10-CM | POA: Diagnosis not present

## 2016-05-05 DIAGNOSIS — M79671 Pain in right foot: Secondary | ICD-10-CM | POA: Diagnosis not present

## 2016-05-06 DIAGNOSIS — L649 Androgenic alopecia, unspecified: Secondary | ICD-10-CM | POA: Diagnosis not present

## 2016-05-06 DIAGNOSIS — Z85828 Personal history of other malignant neoplasm of skin: Secondary | ICD-10-CM | POA: Diagnosis not present

## 2016-05-09 DIAGNOSIS — M79671 Pain in right foot: Secondary | ICD-10-CM | POA: Diagnosis not present

## 2016-05-09 DIAGNOSIS — Z9181 History of falling: Secondary | ICD-10-CM | POA: Diagnosis not present

## 2016-05-09 DIAGNOSIS — M6281 Muscle weakness (generalized): Secondary | ICD-10-CM | POA: Diagnosis not present

## 2016-05-09 DIAGNOSIS — S92231D Displaced fracture of intermediate cuneiform of right foot, subsequent encounter for fracture with routine healing: Secondary | ICD-10-CM | POA: Diagnosis not present

## 2016-05-09 DIAGNOSIS — R262 Difficulty in walking, not elsewhere classified: Secondary | ICD-10-CM | POA: Diagnosis not present

## 2016-05-11 DIAGNOSIS — M79671 Pain in right foot: Secondary | ICD-10-CM | POA: Diagnosis not present

## 2016-05-11 DIAGNOSIS — Z9181 History of falling: Secondary | ICD-10-CM | POA: Diagnosis not present

## 2016-05-11 DIAGNOSIS — M6281 Muscle weakness (generalized): Secondary | ICD-10-CM | POA: Diagnosis not present

## 2016-05-11 DIAGNOSIS — S92231D Displaced fracture of intermediate cuneiform of right foot, subsequent encounter for fracture with routine healing: Secondary | ICD-10-CM | POA: Diagnosis not present

## 2016-05-11 DIAGNOSIS — R262 Difficulty in walking, not elsewhere classified: Secondary | ICD-10-CM | POA: Diagnosis not present

## 2016-05-12 DIAGNOSIS — H35372 Puckering of macula, left eye: Secondary | ICD-10-CM | POA: Diagnosis not present

## 2016-05-12 DIAGNOSIS — Z09 Encounter for follow-up examination after completed treatment for conditions other than malignant neoplasm: Secondary | ICD-10-CM | POA: Diagnosis not present

## 2016-05-12 DIAGNOSIS — M7061 Trochanteric bursitis, right hip: Secondary | ICD-10-CM | POA: Diagnosis not present

## 2016-05-12 DIAGNOSIS — M1611 Unilateral primary osteoarthritis, right hip: Secondary | ICD-10-CM | POA: Diagnosis not present

## 2016-05-12 DIAGNOSIS — M25551 Pain in right hip: Secondary | ICD-10-CM | POA: Diagnosis not present

## 2016-05-13 DIAGNOSIS — M6281 Muscle weakness (generalized): Secondary | ICD-10-CM | POA: Diagnosis not present

## 2016-05-13 DIAGNOSIS — S92231D Displaced fracture of intermediate cuneiform of right foot, subsequent encounter for fracture with routine healing: Secondary | ICD-10-CM | POA: Diagnosis not present

## 2016-05-13 DIAGNOSIS — Z9181 History of falling: Secondary | ICD-10-CM | POA: Diagnosis not present

## 2016-05-13 DIAGNOSIS — R262 Difficulty in walking, not elsewhere classified: Secondary | ICD-10-CM | POA: Diagnosis not present

## 2016-05-13 DIAGNOSIS — M79671 Pain in right foot: Secondary | ICD-10-CM | POA: Diagnosis not present

## 2016-05-17 DIAGNOSIS — M6281 Muscle weakness (generalized): Secondary | ICD-10-CM | POA: Diagnosis not present

## 2016-05-17 DIAGNOSIS — Z9181 History of falling: Secondary | ICD-10-CM | POA: Diagnosis not present

## 2016-05-17 DIAGNOSIS — M79671 Pain in right foot: Secondary | ICD-10-CM | POA: Diagnosis not present

## 2016-05-17 DIAGNOSIS — S92231D Displaced fracture of intermediate cuneiform of right foot, subsequent encounter for fracture with routine healing: Secondary | ICD-10-CM | POA: Diagnosis not present

## 2016-05-17 DIAGNOSIS — R262 Difficulty in walking, not elsewhere classified: Secondary | ICD-10-CM | POA: Diagnosis not present

## 2016-05-18 DIAGNOSIS — Z9181 History of falling: Secondary | ICD-10-CM | POA: Diagnosis not present

## 2016-05-18 DIAGNOSIS — S92231D Displaced fracture of intermediate cuneiform of right foot, subsequent encounter for fracture with routine healing: Secondary | ICD-10-CM | POA: Diagnosis not present

## 2016-05-18 DIAGNOSIS — R262 Difficulty in walking, not elsewhere classified: Secondary | ICD-10-CM | POA: Diagnosis not present

## 2016-05-18 DIAGNOSIS — M79671 Pain in right foot: Secondary | ICD-10-CM | POA: Diagnosis not present

## 2016-05-18 DIAGNOSIS — M6281 Muscle weakness (generalized): Secondary | ICD-10-CM | POA: Diagnosis not present

## 2016-05-20 DIAGNOSIS — R262 Difficulty in walking, not elsewhere classified: Secondary | ICD-10-CM | POA: Diagnosis not present

## 2016-05-20 DIAGNOSIS — Z9181 History of falling: Secondary | ICD-10-CM | POA: Diagnosis not present

## 2016-05-20 DIAGNOSIS — S92231D Displaced fracture of intermediate cuneiform of right foot, subsequent encounter for fracture with routine healing: Secondary | ICD-10-CM | POA: Diagnosis not present

## 2016-05-20 DIAGNOSIS — M6281 Muscle weakness (generalized): Secondary | ICD-10-CM | POA: Diagnosis not present

## 2016-05-20 DIAGNOSIS — M79671 Pain in right foot: Secondary | ICD-10-CM | POA: Diagnosis not present

## 2016-05-24 DIAGNOSIS — Z9181 History of falling: Secondary | ICD-10-CM | POA: Diagnosis not present

## 2016-05-24 DIAGNOSIS — R262 Difficulty in walking, not elsewhere classified: Secondary | ICD-10-CM | POA: Diagnosis not present

## 2016-05-24 DIAGNOSIS — S92231D Displaced fracture of intermediate cuneiform of right foot, subsequent encounter for fracture with routine healing: Secondary | ICD-10-CM | POA: Diagnosis not present

## 2016-05-24 DIAGNOSIS — M79671 Pain in right foot: Secondary | ICD-10-CM | POA: Diagnosis not present

## 2016-05-24 DIAGNOSIS — M6281 Muscle weakness (generalized): Secondary | ICD-10-CM | POA: Diagnosis not present

## 2016-06-08 DIAGNOSIS — S92231D Displaced fracture of intermediate cuneiform of right foot, subsequent encounter for fracture with routine healing: Secondary | ICD-10-CM | POA: Diagnosis not present

## 2016-06-09 ENCOUNTER — Other Ambulatory Visit: Payer: Self-pay | Admitting: Women's Health

## 2016-06-09 ENCOUNTER — Encounter: Payer: Self-pay | Admitting: Women's Health

## 2016-06-09 ENCOUNTER — Ambulatory Visit (INDEPENDENT_AMBULATORY_CARE_PROVIDER_SITE_OTHER): Payer: Medicare Other | Admitting: Women's Health

## 2016-06-09 VITALS — BP 118/80 | Ht 61.0 in | Wt 151.0 lb

## 2016-06-09 DIAGNOSIS — Z90710 Acquired absence of both cervix and uterus: Secondary | ICD-10-CM | POA: Diagnosis not present

## 2016-06-09 DIAGNOSIS — N952 Postmenopausal atrophic vaginitis: Secondary | ICD-10-CM | POA: Diagnosis not present

## 2016-06-09 DIAGNOSIS — Z01419 Encounter for gynecological examination (general) (routine) without abnormal findings: Secondary | ICD-10-CM | POA: Diagnosis not present

## 2016-06-09 MED ORDER — PREDNISONE 5 MG PO TABS: ORAL_TABLET | ORAL | 0 refills | Status: DC

## 2016-06-09 NOTE — Patient Instructions (Signed)

## 2016-06-09 NOTE — Progress Notes (Addendum)
Stacy Parker [birth year] [id number]    History:    Presents for breast and pelvic exam. TAH with BSO for menorrhagia/ no HRT. 2014 negative breast biopsy.  Current on mammograms last one in our chart is in 2014 but states has had annually and they have been normal. Vaccines are current. Osteopenia on no medication primary care manages DEXA. Not sexually active for many years husbands health. 2010 negative colonoscopy. 10 weeks ago broken foot was casted, then a boot but is doing much better. Tripped at home. Today has multiple chigger bites all over from yardwork. Dermatologist has called in medication to treat.  History of basal and squamous cell skin cancer has biannual skin checks.  Past medical history, past surgical history, family history and social history were all reviewed and documented in the EPIC chart. History children. Lives at [facility] also has a home in [place].  ROS:  A ROS was performed and pertinent positives and negatives are included.  Exam:  Vitals:   06/09/16 1021  BP: 118/80  Weight: 151 lb (68.5 kg)  Height: 5\' 1"  (1.549 m)   Body mass index is 28.53 kg/m.   General appearance:  Normal Thyroid:  Symmetrical, normal in size, without palpable masses or nodularity. Respiratory  Auscultation:  Clear without wheezing or rhonchi Cardiovascular  Auscultation:  Regular rate, without rubs, murmurs or gallops  Edema/varicosities:  Not grossly evident Abdominal  Soft,nontender, without masses, guarding or rebound.  Liver/spleen:  No organomegaly noted  Hernia:  None appreciated  Skin  Inspection:  Multiple bites/welts on legs, back and abdomen   Breasts: Examined lying and sitting.     Right: Without masses, retractions, discharge or axillary adenopathy.     Left: Without masses, retractions, discharge or axillary adenopathy. Gentitourinary   Inguinal/mons:  Normal without inguinal adenopathy  External genitalia:  Normal  BUS/Urethra/Skene's  glands:  Normal  Vagina:  Atrophic  Cervix:  And uterus absent Adnexa/parametria:     Rt: Without masses or tenderness.   Lt: Without masses or tenderness.  Anus and perineum: Normal  Digital rectal exam: Normal sphincter tone without palpated masses or tenderness  Assessment/Plan:  82 y.o. MWF G3 P3 for breast and pelvic exam with complaint of intense skin itching from chigger bites.  TAH with BSO for menorrhagia on no HRT Vaginal atrophy Hypertension and Osteopenia primary care manages labs and meds Basal and squamous cell skin cancer-Dr. Ubaldo Glassing  Plan: Follow-up with Dr. Ubaldo Glassing if prescriptions called in from her office do not help with skin rash. SBE's, continue annual screening mammogram, calcium rich diet, vitamin D 2000 daily encouraged. Instructed to have vitamin D level checked at primary care. Home safety, fall prevention and importance of weightbearing exercise reviewed. Encouraged yoga. Vaginal atrophy asymptomatic.  Telephone call from an at the pharmacy was given Vistaril and a topical steroid cream only prefers to take a prednisone Dosepak which she has taken in the past for skin rashes. 6 day prednisone 5mg  Dosepak called in to [facility]. If no relief instructed to follow-up with Dr. Ubaldo Glassing.  Chapman, 11:22 AM 06/09/2016

## 2016-07-07 DIAGNOSIS — H35372 Puckering of macula, left eye: Secondary | ICD-10-CM | POA: Diagnosis not present

## 2016-07-07 DIAGNOSIS — Z09 Encounter for follow-up examination after completed treatment for conditions other than malignant neoplasm: Secondary | ICD-10-CM | POA: Diagnosis not present

## 2016-08-22 ENCOUNTER — Telehealth: Payer: Self-pay | Admitting: Internal Medicine

## 2016-08-22 NOTE — Telephone Encounter (Signed)
Pt states she has been out of town and has had diarrhea for a week and a half. Pt states she has tried Imodium and pepto but the diarrhea continues. Pt scheduled to see Ellouise Newer PA 08/25/16@1 :45pm. Pt aware of appt.

## 2016-08-24 ENCOUNTER — Telehealth: Payer: Self-pay | Admitting: Internal Medicine

## 2016-08-24 NOTE — Telephone Encounter (Signed)
Left message for pt to call back  °

## 2016-08-25 ENCOUNTER — Ambulatory Visit (INDEPENDENT_AMBULATORY_CARE_PROVIDER_SITE_OTHER): Payer: Medicare Other | Admitting: Physician Assistant

## 2016-08-25 ENCOUNTER — Other Ambulatory Visit: Payer: Self-pay

## 2016-08-25 ENCOUNTER — Encounter (INDEPENDENT_AMBULATORY_CARE_PROVIDER_SITE_OTHER): Payer: Self-pay

## 2016-08-25 ENCOUNTER — Other Ambulatory Visit: Payer: Medicare Other

## 2016-08-25 ENCOUNTER — Encounter: Payer: Self-pay | Admitting: Physician Assistant

## 2016-08-25 VITALS — BP 122/60 | HR 68 | Ht 61.0 in | Wt 149.5 lb

## 2016-08-25 DIAGNOSIS — R197 Diarrhea, unspecified: Secondary | ICD-10-CM | POA: Diagnosis not present

## 2016-08-25 NOTE — Patient Instructions (Addendum)
If you are age 80 or older, your body mass index should be between 23-30. Your Body mass index is 28.25 kg/m. If this is out of the aforementioned range listed, please consider follow up with your Primary Care Provider.  If you are age 39 or younger, your body mass index should be between 19-25. Your Body mass index is 28.25 kg/m. If this is out of the aformentioned range listed, please consider follow up with your Primary Care Provider.   Your physician has requested that you go to the basement for the following lab work before leaving today:  Please follow up with Ellouise Newer PA on 09/08/16 at 1:15pm.  Thank you.

## 2016-08-25 NOTE — Progress Notes (Signed)
Agree with initial assessment and plans as outlined 

## 2016-08-25 NOTE — Telephone Encounter (Signed)
Pt saw Ellouise Newer PA today.

## 2016-08-25 NOTE — Progress Notes (Signed)
Chief Complaint: Diarrhea  HPI: Stacy Parker is a 82 year old Caucasian female  who was referred to me by Burnard Bunting, MD for a complaint of diarrhea. Patient is known to Dr. Henrene Pastor from prior endoscopic evaluation. She underwent upper endoscopy in December 2010 which showed an active distal esophagitis and a distal esophageal stricture which was not dilated. She also had colonoscopy in January 2010 which showed severe diverticulosis and an incomplete exam due to stenosis in the sigmoid colon and had subsequent barium enema.  Patient had recent upper GI study on 06/30/2015 for reflux symptoms with burning sensation, status post hiatal hernia repair in 2013 which showed a moderately large sliding hiatal hernia, esophageal dysmotility and no stricture mass or ulceration.  Today, the patient tells me that she started having what she calls diarrhea on October 3. She expresses that this is not "normal diarrhea", instead this is still a formed stool that "breaks apart in the toilet" and has a "horrible smell". The patient tells me that typically she has been having 2-3 bowel movements in the morning and then is okay the rest of the day, though this does somewhat hinder her schedule. When this started she was on vacation in [place]. This continued initially for 3 days before she took Imodium per package instructions, a total of 3 tablets, and this worked well for her symptoms. She then started again with symptoms around 3-4 days later and again took Imodium on October 14. She then did not have a bowel movement for 4 days. Yesterday, she decided that this was "long enough not to have a bowel movement", and ate a "regular diet including a salad and some cereal and ice cream etc, instead of the BRAT diet she had been on ", this morning all of her symptoms started again. She had 2-3 episodes of formed but loose stool. Patient denies any sick contacts or changes in medication recently, though she was traveling.  She does continue to take her daily probiotic.  Patient denies fever, chills, blood in her stool, melena, abdominal pain, nausea or vomiting.  Past Medical History:  Diagnosis Date  . Anxiety   . Arthritis   . Basal cell carcinoma   . Cervical arthritis ([place])    PT STATES CERVICAL SPINE BADLY DEGENERATED--SHE DOES NOT HAVE NECK PAIN -BUT TOLD HER NECK IS FRAGILE  . Depression   . Diverticulosis   . GERD (gastroesophageal reflux disease)   . H/O hiatal hernia   . HTN (hypertension)   . Insomnia   . Osteopenia   . Osteoporosis   . Reflux   . Renal artery stenosis ([facility])    PT STATES STUDY WAS DONE YRS AGO--STATES HER KIDNEY FUNCTION IS FINE  . Sciatic nerve disease   . Sciatic pain    RIGHT LEG / BACK PAIN- PT ON OXYCONTIN 3 TIMES A DAY--STATES MUST CONTINUE WHILE IN HOSPITAL TO AVOID WITHDRAWAL  . Sleep apnea 04/12/2012   STOP BANG SCORE 4  . Squamous carcinoma     Past Surgical History:  Procedure Laterality Date  . BREAST SURGERY     Fibroma, right  . HIATAL HERNIA REPAIR  04/17/2012   Procedure: LAPAROSCOPIC REPAIR OF HIATAL HERNIA;  Surgeon: Pedro Earls, MD;  Location: [facility];  Service: General;  Laterality: N/A;  Large Hiatus Hernia  . INTRAOPERATIVE ARTERIOGRAM    . neuro stimulator     THORACIC AREA-BATTERY IN RIGHT HIP PT TURNS OFF AND ON WITH MAGNET-PT STATES SHE IS PAIN FREE  WHEN LYING DOWN -BUT WHEN SITTING OR STANDING SHE EXPERIENCES TINGLING AND PAIN DOWN RIGHT LEG-SCIATIC PAIN  . SPINAL FUSION     8 level  LUMBAR/THORACIC  . TOTAL HIP ARTHROPLASTY     left  . TOTAL VAGINAL HYSTERECTOMY  1993  . TUBAL LIGATION    . verteboplasty      Current Outpatient Prescriptions  Medication Sig Dispense Refill  . amLODipine (NORVASC) 5 MG tablet Take 1 tablet by mouth daily.  4  . aspirin EC 81 MG tablet Take 81 mg by mouth See admin instructions. Takes 4 times per week..monday,wednesday,friday,saturday    . Biotin 5000 MCG CAPS Take 1 capsule by mouth daily.    .  Calcium-Magnesium-Zinc 167-83-8 MG TABS Take 1 tablet by mouth daily.    . cholecalciferol (VITAMIN D) 1000 UNITS tablet Take 1,000 Units by mouth daily.    . citalopram (CELEXA) 20 MG tablet Take 1 tablet by mouth daily.    . citalopram (CELEXA) 40 MG tablet Take 40 mg by mouth daily.    Mariane Baumgarten Sodium (DOC-Q-LACE PO) Take 1 tablet by mouth 2 (two) times daily.     . methocarbamol (ROBAXIN) 500 MG tablet Take 500 mg by mouth 4 (four) times daily. PT HAS- BUT NEVER USES--IT IS FOR BACK SPASMS    . Multiple Vitamin (MULTIVITAMIN) tablet Take 1 tablet by mouth daily.     . OMEGA 3 1200 MG CAPS Take 1 capsule by mouth 2 (two) times daily.    Marland Kitchen OVER THE COUNTER MEDICATION QUININE SULFATE  300 MG PO daily- PT STATES RX THAT SHE OBTAINS FROM [place]  FOR LEG CRAMPS    . pantoprazole (PROTONIX) 40 MG tablet Take 40 mg by mouth 2 (two) times daily.    . predniSONE (DELTASONE) 5 MG tablet Take 5 tablets today, and decrease amt by one tablet each day for 4 days and then 1 tablet daily for 2 days 16 tablet 0  . Probiotic Product (ALIGN PO) Take by mouth. ONE DAILY    . vitamin C (ASCORBIC ACID) 500 MG tablet Take 500 mg by mouth daily.     No current facility-administered medications for this visit.     Allergies as of 08/25/2016 - Review Complete 06/09/2016  Allergen Reaction Noted  . Ace inhibitors  06/04/2015  . Ambien  [zolpidem tartrate] Diarrhea 04/18/2014  . Boniva [ibandronic acid] Other (See Comments) 03/19/2012  . Ceftin Other (See Comments) 10/12/2011  . Ceftin  [cefuroxime axetil] Diarrhea and Swelling 04/18/2014  . Cymbalta [duloxetine hcl] Nausea Only 01/05/2012  . Dicloxacillin Diarrhea 01/05/2012  . Doxycycline Other (See Comments) and Nausea Only   . Metronidazole Nausea And Vomiting and Nausea Only   . Phenergan  [promethazine hcl] Other (See Comments) 04/18/2014  . Promethazine hcl      Family History  Problem Relation Age of Onset  . Hypertension Mother   . COPD Mother     . Heart disease Father   . Hypertension Father   . Skin cancer Father   . Breast cancer Maternal Grandmother     Social History   Social History  . Marital status: Married    Spouse name: N/A  . Number of children: 2  . Years of education: N/A   Occupational History  . retired Retired   Social History Main Topics  . Smoking status: Former Smoker    Types: Cigarettes  . Smokeless tobacco: Never Used     Comment: QUIT SMOKING IN HER EARLY  55'S  AND WAS NEVER A HEAVY SMOKER  . Alcohol use 0.0 oz/week     Comment: rarely  . Drug use: No  . Sexual activity: No     Comment: DECLINED INSURCNACE QUESTIONS   Other Topics Concern  . Not on file   Social History Narrative  . No narrative on file    Review of Systems:     Constitutional: No weight loss, fever, chills, weakness or fatigue HEENT: Eyes: No change in vision               Ears, Nose, Throat:  No change in hearing or congestion Skin: No rash or itching Cardiovascular: No chest pain, chest pressure or palpitations   Respiratory: No SOB or cough Gastrointestinal: See HPI and otherwise negative Genitourinary: No dysuria or change in urinary frequency Neurological: No headache, dizziness or syncope Musculoskeletal: No new muscle or joint pain Hematologic: No bleeding or bruising Psychiatric: No history of depression or anxiety    Physical Exam:  Vital signs: BP 122/60 (BP Location: Left Arm, Patient Position: Sitting, Cuff Size: Normal)   Pulse 68   Ht 5\' 1"  (1.549 m) Comment: height measured without shoes  Wt 149 lb 8 oz (67.8 kg)   BMI 28.25 kg/m    General:   Pleasant elderly Caucasian female who appears to be in NAD, Well developed, Well nourished, alert and cooperative Head:  Normocephalic and atraumatic. Eyes:   PEERL, EOMI. No icterus. Conjunctiva pink. Ears:  Normal auditory acuity. Neck:  Supple Throat: Oral cavity and pharynx without inflammation, swelling or lesion.  Lungs: Respirations even  and unlabored. Lungs clear to auscultation bilaterally.   No wheezes, crackles, or rhonchi.  Heart: Normal S1, S2. No MRG. Regular rate and rhythm. No peripheral edema, cyanosis or pallor.  Abdomen:  Soft, nondistended, nontender. No rebound or guarding. Normal bowel sounds. No appreciable masses or hepatomegaly. Rectal:  Not performed.  Msk:  Symmetrical without gross deformities.  Extremities:  Without edema, no deformity or joint abnormality. Normal ROM. Neurologic:  Alert and  oriented x4;  grossly normal neurologically.  Skin:   Dry and intact without significant lesions or rashes. Psychiatric: Oriented to person, place and time. Demonstrates good judgement and reason without abnormal affect or behaviors.  No Recent Labs  Most recent imaging: 06/30/15 upper GI with KUB: FINDINGS: The preliminary supine radiograph of the abdomen demonstrates a normal bowel gas pattern. Pedicle screw and rod fixation is demonstrated extending from the T11 level through the S1 level. Bilateral laminectomy defects in the mid and lower lumbar spine with interbody bone plugs and posterior bone and methylmethacrylate effusion. Neural stimulator leads overlying the lower thoracic spine. Left total hip prosthesis. Surgical clips in the superior aspect of the left upper quadrant in gastroesophageal junction region.  The patient swallowed barium without difficulty. Normal primary esophageal peristalsis with intermittently impaired secondary peristalsis and intermittent tertiary peristaltic activity. There is prolonged pulling of the in barium in the mid and distal esophagus in the prone position and prolonged pooling of barium in the distal esophagus in the upright position. No esophageal masses, strictures or ulcerations were seen. The patient swallowed a 12.5 mm in diameter barium tablet without difficulty. This passed normally through the esophagus into the stomach after initially lodging at the  gastroesophageal junction.  A moderately large sliding hiatal hernia is demonstrated. Otherwise, the stomach and duodenal bulb have normal appearances. A small diverticulum is noted arising from the third portion of the duodenum. No gastric  or duodenal strictures, masses or ulcerations were seen. No gastroesophageal reflux was seen during the examination.  IMPRESSION: 1. Moderately large sliding hiatal hernia. 2. Esophageal dysmotility. 3. No stricture, mass or ulceration.   Electronically Signed   By: Claudie Revering M.D.   On: 06/30/2015 10:08   Assessment: 1. Diarrhea: Patient describes symptoms starting 2 weeks ago, diarrhea has been stopped with Imodium usage on 2 occasions for a period of 3-4 days, patient describes change in bowel consistency noting a formed but loose stool that "breaks apart in the toilet", she is going 2-3 times in the morning, urgently, and then will be fine for the rest of the day. This did start while she was on vacation in [place], no blood in her stool, fever, chills, nausea or vomiting; question infectious cause versus IBS versus change in diet versus other  Plan: 1. Ordered stool studies to include a GI pathogen panel and O&P 2. Ordered CBC and CMP due to diarrhea 3. If GI pathogen panel normal, will discuss with patient whether possible colonoscopy with biopsy is necessary 4. Patient to follow in clinic in 2-3 weeks with me  Ellouise Newer, PA-C [facility] 08/25/2016, 1:39 PM  Cc: Burnard Bunting, MD

## 2016-08-26 ENCOUNTER — Other Ambulatory Visit: Payer: Self-pay

## 2016-08-26 DIAGNOSIS — R197 Diarrhea, unspecified: Secondary | ICD-10-CM | POA: Diagnosis not present

## 2016-08-29 LAB — OVA AND PARASITE EXAMINATION: OP: NONE SEEN

## 2016-08-29 LAB — GASTROINTESTINAL PATHOGEN PANEL PCR
C. difficile Tox A/B, PCR: NOT DETECTED
CRYPTOSPORIDIUM, PCR: NOT DETECTED
Campylobacter, PCR: NOT DETECTED
E COLI (STEC) STX1/STX2, PCR: NOT DETECTED
E coli (ETEC) LT/ST PCR: NOT DETECTED
E coli 0157, PCR: NOT DETECTED
Giardia lamblia, PCR: NOT DETECTED
NOROVIRUS, PCR: NOT DETECTED
ROTAVIRUS, PCR: NOT DETECTED
SALMONELLA, PCR: NOT DETECTED
SHIGELLA, PCR: NOT DETECTED

## 2016-08-29 LAB — CDIFF NAA+O+P+STOOL CULTURE: Toxigenic C. Difficile by PCR: NEGATIVE

## 2016-09-07 ENCOUNTER — Other Ambulatory Visit: Payer: Self-pay

## 2016-09-07 ENCOUNTER — Other Ambulatory Visit: Payer: Medicare Other

## 2016-09-07 DIAGNOSIS — L820 Inflamed seborrheic keratosis: Secondary | ICD-10-CM | POA: Diagnosis not present

## 2016-09-07 DIAGNOSIS — Z85828 Personal history of other malignant neoplasm of skin: Secondary | ICD-10-CM | POA: Diagnosis not present

## 2016-09-07 DIAGNOSIS — R197 Diarrhea, unspecified: Secondary | ICD-10-CM

## 2016-09-07 DIAGNOSIS — D1801 Hemangioma of skin and subcutaneous tissue: Secondary | ICD-10-CM | POA: Diagnosis not present

## 2016-09-07 DIAGNOSIS — L821 Other seborrheic keratosis: Secondary | ICD-10-CM | POA: Diagnosis not present

## 2016-09-07 DIAGNOSIS — L570 Actinic keratosis: Secondary | ICD-10-CM | POA: Diagnosis not present

## 2016-09-08 ENCOUNTER — Ambulatory Visit: Payer: Self-pay | Admitting: Physician Assistant

## 2016-09-08 ENCOUNTER — Ambulatory Visit (INDEPENDENT_AMBULATORY_CARE_PROVIDER_SITE_OTHER): Payer: Medicare Other | Admitting: Physician Assistant

## 2016-09-08 ENCOUNTER — Encounter: Payer: Self-pay | Admitting: Physician Assistant

## 2016-09-08 VITALS — BP 104/56 | HR 76 | Ht 61.0 in | Wt 151.4 lb

## 2016-09-08 DIAGNOSIS — R197 Diarrhea, unspecified: Secondary | ICD-10-CM | POA: Diagnosis not present

## 2016-09-08 NOTE — Progress Notes (Signed)
Agree with initial assessment and plans 

## 2016-09-08 NOTE — Patient Instructions (Signed)
Follow up as needed

## 2016-09-08 NOTE — Progress Notes (Signed)
Chief Complaint: Diarrhea  HPI:  Stacy Parker is an 82 year old Caucasian female, who was referred to me by Burnard Bunting, MD for a complaint of diarrhea. The patient is known to Dr. Henrene Pastor from prior endoscopic evaluation. Please recall she initially presented to clinic on 08/25/16 with a complaint of diarrhea. At that time stool studies were ordered and negative. A culture is still pending.     Today, the patient presented to clinic and tells me that her symptoms have improved. She will have one day of diarrhea followed by 2-3 days of normal regular bowel movements, during which time she is eating "anything I want to". The patient is currently in a "normal bowel habit part of the cycle". She is hoping that she will not return to diarrhea. The patient did add a fiber supplement to her diet. She continues a daily probiotic. The patient is currently comfortable with her symptoms and does not wish to evaluate this further at this time.  Patient denies fever, chills, nausea, vomiting. blood in her stool, melena or abdominal pain.     Past Medical History:  Diagnosis Date  . Anxiety   . Arthritis   . Basal cell carcinoma   . Cervical arthritis ([place])    PT STATES CERVICAL SPINE BADLY DEGENERATED--SHE DOES NOT HAVE NECK PAIN -BUT TOLD HER NECK IS FRAGILE  . Depression   . Diverticulosis   . GERD (gastroesophageal reflux disease)   . H/O hiatal hernia   . HTN (hypertension)   . Insomnia   . Osteopenia   . Osteoporosis   . Reflux   . Renal artery stenosis ([facility])    PT STATES STUDY WAS DONE YRS AGO--STATES HER KIDNEY FUNCTION IS FINE  . Sciatic nerve disease   . Sciatic pain    RIGHT LEG / BACK PAIN- PT ON OXYCONTIN 3 TIMES A DAY--STATES MUST CONTINUE WHILE IN HOSPITAL TO AVOID WITHDRAWAL  . Sleep apnea 04/12/2012   STOP BANG SCORE 4  . Squamous carcinoma     Past Surgical History:  Procedure Laterality Date  . BREAST SURGERY     Fibroma, right  . HIATAL HERNIA REPAIR  04/17/2012   Procedure: LAPAROSCOPIC REPAIR OF HIATAL HERNIA;  Surgeon: Pedro Earls, MD;  Location: [facility];  Service: General;  Laterality: N/A;  Large Hiatus Hernia  . INTRAOPERATIVE ARTERIOGRAM    . neuro stimulator     THORACIC AREA-BATTERY IN RIGHT HIP PT TURNS OFF AND ON WITH MAGNET-PT STATES SHE IS PAIN FREE WHEN LYING DOWN -BUT WHEN SITTING OR STANDING SHE EXPERIENCES TINGLING AND PAIN DOWN RIGHT LEG-SCIATIC PAIN  . SPINAL FUSION     8 level  LUMBAR/THORACIC  . TOTAL HIP ARTHROPLASTY     left  . TOTAL VAGINAL HYSTERECTOMY  1993  . TUBAL LIGATION    . verteboplasty      Current Outpatient Prescriptions  Medication Sig Dispense Refill  . amLODipine (NORVASC) 5 MG tablet Take 1 tablet by mouth daily.  4  . aspirin EC 81 MG tablet Take 81 mg by mouth See admin instructions. Takes 4 times per week..monday,wednesday,friday,saturday    . Biotin 5000 MCG CAPS Take 1 capsule by mouth daily.    . Calcium-Magnesium-Zinc 167-83-8 MG TABS Take 1 tablet by mouth daily.    . cholecalciferol (VITAMIN D) 1000 UNITS tablet Take 1,000 Units by mouth daily.    . citalopram (CELEXA) 40 MG tablet Take 40 mg by mouth daily.    Mariane Baumgarten Sodium (DOC-Q-LACE PO)  Take 1 tablet by mouth 2 (two) times daily.     . finasteride (PROSCAR) 5 MG tablet Take 2.5 mg by mouth daily.    . Melatonin 5 MG TABS Take 1 tablet by mouth at bedtime.    . Multiple Vitamin (MULTIVITAMIN) tablet Take 1 tablet by mouth daily.     . OMEGA 3 1200 MG CAPS Take 1 capsule by mouth 2 (two) times daily.    . pantoprazole (PROTONIX) 40 MG tablet Take 40 mg by mouth daily.     . Probiotic Product (ALIGN PO) Take by mouth. ONE DAILY    . QUININE SULFATE PO Take 300 mg by mouth daily. For cramps    . vitamin C (ASCORBIC ACID) 500 MG tablet Take 500 mg by mouth daily.    . methocarbamol (ROBAXIN) 500 MG tablet Take 500 mg by mouth as needed. PT HAS- BUT NEVER USES--IT IS FOR BACK SPASMS      No current facility-administered medications for this  visit.     Allergies as of 09/08/2016 - Review Complete 09/08/2016  Allergen Reaction Noted  . Ace inhibitors  06/04/2015  . Ambien  [zolpidem tartrate] Diarrhea 04/18/2014  . Boniva [ibandronic acid] Other (See Comments) 03/19/2012  . Ceftin Other (See Comments) 10/12/2011  . Ceftin  [cefuroxime axetil] Diarrhea and Swelling 04/18/2014  . Cymbalta [duloxetine hcl] Nausea Only 01/05/2012  . Dicloxacillin Diarrhea 01/05/2012  . Doxycycline Other (See Comments) and Nausea Only   . Metronidazole Nausea And Vomiting and Nausea Only   . Phenergan  [promethazine hcl] Other (See Comments) 04/18/2014  . Promethazine hcl      Family History  Problem Relation Age of Onset  . Hypertension Mother   . COPD Mother   . Heart disease Father   . Hypertension Father   . Skin cancer Father   . Breast cancer Maternal Grandmother     Social History   Social History  . Marital status: Married    Spouse name: N/A  . Number of children: 2  . Years of education: N/A   Occupational History  . retired Retired   Social History Main Topics  . Smoking status: Former Smoker    Types: Cigarettes  . Smokeless tobacco: Never Used     Comment: QUIT SMOKING IN HER EARLY 40'S  AND WAS NEVER A HEAVY SMOKER  . Alcohol use 0.0 oz/week     Comment: rarely  . Drug use: No  . Sexual activity: No     Comment: DECLINED INSURCNACE QUESTIONS   Other Topics Concern  . Not on file   Social History Narrative  . No narrative on file    Review of Systems:     Constitutional: No weight loss, fever, chills, weakness or fatigue HEENT: Eyes: No change in vision               Ears, Nose, Throat:  No change in hearing or congestion Skin: No rash or itching Cardiovascular: No chest pain, chest pressure or palpitations   Respiratory: No SOB or cough Gastrointestinal: See HPI and otherwise negative Genitourinary: No dysuria or change in urinary frequency Neurological: No headache, dizziness or  syncope Musculoskeletal: No new muscle or joint pain Hematologic: No bleeding or bruising Psychiatric: No history of depression or anxiety    Physical Exam:  Vital signs: BP (!) 104/56 (BP Location: Left Arm, Patient Position: Sitting, Cuff Size: Normal)   Pulse 76   Ht 5\' 1"  (1.549 m)   Wt 151 lb  6 oz (68.7 kg)   BMI 28.60 kg/m   General:   Pleasant elderly Caucasian female appears to be in NAD, Well developed, Well nourished, alert and cooperative Lungs: Respirations even and unlabored. Lungs clear to auscultation bilaterally.   No wheezes, crackles, or rhonchi.  Heart: Normal S1, S2. No MRG. Regular rate and rhythm. No peripheral edema, cyanosis or pallor.  Abdomen:  Soft, nondistended, nontender. No rebound or guarding. Normal bowel sounds. No appreciable masses or hepatomegaly. Psychiatric: Oriented to person, place and time. Demonstrates good judgement and reason without abnormal affect or behaviors.  RELEVANT LABS: Results for VENECIA, ENDRESS (MRN [id number]) as of 09/08/2016 14:47  Ref. Range 08/26/2016 12:44  Norovirus, PCR Unknown Not Detected  Rotavirus A, PCR Unknown Not Detected  Ova + Parasite Exam Unknown CANCELED  Toxigenic C Difficile by pcr Latest Ref Range: Negative  Negative  OP Unknown No Ova or Parasit...  OVA AND PARASITE EXAMINATION Unknown Rpt  Campylobacter, PCR Unknown Not Detected  C. difficile Tox A/B, PCR Unknown Not Detected  E coli 0157, PCR Unknown Not Detected  E coli (ETEC) LT/ST PCR Unknown Not Detected  E coli (STEC) stx1/stx2, PCR Unknown Not Detected  Salmonella, PCR Unknown Not Detected  Shigella, PCR Unknown Not Detected  Giardia lamblia, PCR Unknown Not Detected  Cryptosporidium, PCR Unknown Not Detected   No O&P.   Stool culture pending.  Assessment: 1. Diarrhea: Patient initially seen on 08/25/16 with a complaint of change to diarrhea beginning of October after a trip to [place] ordered stool studies with negative results as  above, apparently stool culture was never done and is currently pending as patient brought in a sample yesterday to repeat this test, currently symptoms have improved slightly with a daily fiber supplement and patient continuing on her daily probiotic, describes some stool that breaks apart in the toilet every third day or so, and between the patient has regular bowel movements and eats a regular diet, she is currently well, hoping that she does not develop diarrhea again; consider IBS versus possible microscopic colitis  Plan: 1. Reviewed patient's stool studies with her at today's visit. We will let her know when results from stool culture are available. 2. Continue high-fiber diet and daily probiotic. 3. Discussed a colonoscopy for further evaluation and the patient declines at this time. She would like to "see how things go over the next month or so", before proceeding with a colonoscopy. Told the patient that if she calls our clinic anytime in the next 6 months, we can schedule her for one of these with Dr. Henrene Pastor in the [facility]. 4. Patient to follow in clinic as needed.  Ellouise Newer, PA-C [facility] 09/08/2016, 2:58 PM  Cc: Burnard Bunting, MD

## 2016-09-11 LAB — STOOL CULTURE

## 2016-09-13 DIAGNOSIS — Z23 Encounter for immunization: Secondary | ICD-10-CM | POA: Diagnosis not present

## 2016-09-22 DIAGNOSIS — M1611 Unilateral primary osteoarthritis, right hip: Secondary | ICD-10-CM | POA: Diagnosis not present

## 2016-09-22 DIAGNOSIS — M7061 Trochanteric bursitis, right hip: Secondary | ICD-10-CM | POA: Diagnosis not present

## 2016-10-14 DIAGNOSIS — H35372 Puckering of macula, left eye: Secondary | ICD-10-CM | POA: Diagnosis not present

## 2016-10-19 DIAGNOSIS — S32110A Nondisplaced Zone I fracture of sacrum, initial encounter for closed fracture: Secondary | ICD-10-CM | POA: Diagnosis not present

## 2016-10-19 DIAGNOSIS — G8929 Other chronic pain: Secondary | ICD-10-CM | POA: Diagnosis not present

## 2016-10-19 DIAGNOSIS — M545 Low back pain: Secondary | ICD-10-CM | POA: Diagnosis not present

## 2016-10-19 DIAGNOSIS — M961 Postlaminectomy syndrome, not elsewhere classified: Secondary | ICD-10-CM | POA: Diagnosis not present

## 2016-10-19 DIAGNOSIS — Z87891 Personal history of nicotine dependence: Secondary | ICD-10-CM | POA: Diagnosis not present

## 2016-10-25 ENCOUNTER — Other Ambulatory Visit: Payer: Self-pay | Admitting: Orthopedic Surgery

## 2016-10-25 DIAGNOSIS — M961 Postlaminectomy syndrome, not elsewhere classified: Secondary | ICD-10-CM

## 2016-10-25 DIAGNOSIS — S32110A Nondisplaced Zone I fracture of sacrum, initial encounter for closed fracture: Secondary | ICD-10-CM

## 2016-10-26 DIAGNOSIS — Z1231 Encounter for screening mammogram for malignant neoplasm of breast: Secondary | ICD-10-CM | POA: Diagnosis not present

## 2016-10-26 DIAGNOSIS — Z803 Family history of malignant neoplasm of breast: Secondary | ICD-10-CM | POA: Diagnosis not present

## 2016-10-28 ENCOUNTER — Other Ambulatory Visit: Payer: Self-pay | Admitting: Orthopedic Surgery

## 2016-10-28 DIAGNOSIS — S32110A Nondisplaced Zone I fracture of sacrum, initial encounter for closed fracture: Secondary | ICD-10-CM

## 2016-10-28 DIAGNOSIS — M961 Postlaminectomy syndrome, not elsewhere classified: Secondary | ICD-10-CM

## 2016-11-09 ENCOUNTER — Ambulatory Visit
Admission: RE | Admit: 2016-11-09 | Discharge: 2016-11-09 | Disposition: A | Discharge status: Home / Self Care | Payer: Medicare Other | Source: Ambulatory Visit | Attending: Orthopedic Surgery | Admitting: Orthopedic Surgery

## 2016-11-09 DIAGNOSIS — M1611 Unilateral primary osteoarthritis, right hip: Secondary | ICD-10-CM | POA: Diagnosis not present

## 2016-11-09 DIAGNOSIS — M961 Postlaminectomy syndrome, not elsewhere classified: Secondary | ICD-10-CM

## 2016-11-09 DIAGNOSIS — S32110A Nondisplaced Zone I fracture of sacrum, initial encounter for closed fracture: Secondary | ICD-10-CM

## 2016-11-25 DIAGNOSIS — M1611 Unilateral primary osteoarthritis, right hip: Secondary | ICD-10-CM | POA: Diagnosis not present

## 2016-11-30 DIAGNOSIS — M1611 Unilateral primary osteoarthritis, right hip: Secondary | ICD-10-CM | POA: Diagnosis not present

## 2016-12-16 DIAGNOSIS — M1611 Unilateral primary osteoarthritis, right hip: Secondary | ICD-10-CM | POA: Diagnosis not present

## 2017-01-10 DIAGNOSIS — M810 Age-related osteoporosis without current pathological fracture: Secondary | ICD-10-CM | POA: Diagnosis not present

## 2017-01-10 DIAGNOSIS — Z13220 Encounter for screening for lipoid disorders: Secondary | ICD-10-CM | POA: Diagnosis not present

## 2017-01-10 DIAGNOSIS — I10 Essential (primary) hypertension: Secondary | ICD-10-CM | POA: Diagnosis not present

## 2017-01-10 DIAGNOSIS — Z1329 Encounter for screening for other suspected endocrine disorder: Secondary | ICD-10-CM | POA: Diagnosis not present

## 2017-01-11 DIAGNOSIS — Z0000 Encounter for general adult medical examination without abnormal findings: Secondary | ICD-10-CM | POA: Diagnosis not present

## 2017-01-11 DIAGNOSIS — M810 Age-related osteoporosis without current pathological fracture: Secondary | ICD-10-CM | POA: Diagnosis not present

## 2017-01-11 DIAGNOSIS — Z1329 Encounter for screening for other suspected endocrine disorder: Secondary | ICD-10-CM | POA: Diagnosis not present

## 2017-01-11 DIAGNOSIS — R209 Unspecified disturbances of skin sensation: Secondary | ICD-10-CM | POA: Diagnosis not present

## 2017-01-11 DIAGNOSIS — I10 Essential (primary) hypertension: Secondary | ICD-10-CM | POA: Diagnosis not present

## 2017-01-11 DIAGNOSIS — Z130 Encounter for screening for diseases of the blood and blood-forming organs and certain disorders involving the immune mechanism: Secondary | ICD-10-CM | POA: Diagnosis not present

## 2017-01-11 DIAGNOSIS — R269 Unspecified abnormalities of gait and mobility: Secondary | ICD-10-CM | POA: Diagnosis not present

## 2017-01-11 DIAGNOSIS — Z13220 Encounter for screening for lipoid disorders: Secondary | ICD-10-CM | POA: Diagnosis not present

## 2017-01-18 DIAGNOSIS — M1990 Unspecified osteoarthritis, unspecified site: Secondary | ICD-10-CM | POA: Diagnosis not present

## 2017-01-18 DIAGNOSIS — Z6827 Body mass index (BMI) 27.0-27.9, adult: Secondary | ICD-10-CM | POA: Diagnosis not present

## 2017-01-18 DIAGNOSIS — I10 Essential (primary) hypertension: Secondary | ICD-10-CM | POA: Diagnosis not present

## 2017-01-18 DIAGNOSIS — M810 Age-related osteoporosis without current pathological fracture: Secondary | ICD-10-CM | POA: Diagnosis not present

## 2017-01-18 DIAGNOSIS — Z23 Encounter for immunization: Secondary | ICD-10-CM | POA: Diagnosis not present

## 2017-01-18 DIAGNOSIS — M545 Low back pain: Secondary | ICD-10-CM | POA: Diagnosis not present

## 2017-01-18 DIAGNOSIS — F418 Other specified anxiety disorders: Secondary | ICD-10-CM | POA: Diagnosis not present

## 2017-01-18 DIAGNOSIS — M419 Scoliosis, unspecified: Secondary | ICD-10-CM | POA: Diagnosis not present

## 2017-01-18 DIAGNOSIS — M5380 Other specified dorsopathies, site unspecified: Secondary | ICD-10-CM | POA: Diagnosis not present

## 2017-01-18 DIAGNOSIS — Z86718 Personal history of other venous thrombosis and embolism: Secondary | ICD-10-CM | POA: Diagnosis not present

## 2017-01-18 DIAGNOSIS — Z1389 Encounter for screening for other disorder: Secondary | ICD-10-CM | POA: Diagnosis not present

## 2017-01-18 DIAGNOSIS — Z0000 Encounter for general adult medical examination without abnormal findings: Secondary | ICD-10-CM | POA: Diagnosis not present

## 2017-02-24 DIAGNOSIS — N390 Urinary tract infection, site not specified: Secondary | ICD-10-CM | POA: Diagnosis not present

## 2017-02-28 DIAGNOSIS — M810 Age-related osteoporosis without current pathological fracture: Secondary | ICD-10-CM | POA: Diagnosis not present

## 2017-03-01 ENCOUNTER — Other Ambulatory Visit: Payer: Self-pay | Admitting: Orthopedic Surgery

## 2017-03-02 DIAGNOSIS — Z85828 Personal history of other malignant neoplasm of skin: Secondary | ICD-10-CM | POA: Diagnosis not present

## 2017-03-02 DIAGNOSIS — L814 Other melanin hyperpigmentation: Secondary | ICD-10-CM | POA: Diagnosis not present

## 2017-03-02 DIAGNOSIS — C44519 Basal cell carcinoma of skin of other part of trunk: Secondary | ICD-10-CM | POA: Diagnosis not present

## 2017-03-02 DIAGNOSIS — L821 Other seborrheic keratosis: Secondary | ICD-10-CM | POA: Diagnosis not present

## 2017-03-02 DIAGNOSIS — D485 Neoplasm of uncertain behavior of skin: Secondary | ICD-10-CM | POA: Diagnosis not present

## 2017-03-02 DIAGNOSIS — L820 Inflamed seborrheic keratosis: Secondary | ICD-10-CM | POA: Diagnosis not present

## 2017-03-02 DIAGNOSIS — L570 Actinic keratosis: Secondary | ICD-10-CM | POA: Diagnosis not present

## 2017-03-06 DIAGNOSIS — N390 Urinary tract infection, site not specified: Secondary | ICD-10-CM | POA: Diagnosis not present

## 2017-03-09 ENCOUNTER — Ambulatory Visit: Payer: Self-pay | Admitting: Orthopedic Surgery

## 2017-03-15 NOTE — Patient Instructions (Addendum)
ELIETTE DRUMWRIGHT  03/15/2017   Your procedure is scheduled on: 03/29/17  Report to [facility] Main  Entrance               Report to admitting at         1130 AM  Call this number if you have problems the morning of surgery  [phone]   Remember: ONLY 1 PERSON MAY GO WITH YOU TO SHORT STAY TO GET  READY MORNING OF YOUR SURGERY.   Do not eat food:After Midnight.  YOU MAY HAVE CLEAR LIQUIDS UNTIL 0800 THEN NOTHING BY MOUTH     Take these medicines the morning of surgery with A SIP OF WATER: eye drops as usual, pantoprazole(protonix), citalopram (celexa)                                You may not have any metal on your body including hair pins and              piercings  Do not wear jewelry, make-up, lotions, powders or perfumes, deodorant             Do not wear nail polish.  Do not shave  48 hours prior to surgery.            .   Do not bring valuables to the hospital. [place].  Contacts, dentures or bridgework may not be worn into surgery.  Leave suitcase in the car. After surgery it may be brought to your room.                Please read over the following fact sheets you were given: _____________________________________________________________________           [facility] - Preparing for Surgery Before surgery, you can play an important role.  Because skin is not sterile, your skin needs to be as free of germs as possible.  You can reduce the number of germs on your skin by washing with CHG (chlorahexidine gluconate) soap before surgery.  CHG is an antiseptic cleaner which kills germs and bonds with the skin to continue killing germs even after washing. Please DO NOT use if you have an allergy to CHG or antibacterial soaps.  If your skin becomes reddened/irritated stop using the CHG and inform your nurse when you arrive at Short Stay. Do not shave (including legs and underarms) for at least 48  hours prior to the first CHG shower.  You may shave your face/neck. Please follow these instructions carefully:  1.  Shower with CHG Soap the night before surgery and the  morning of Surgery.  2.  If you choose to wash your hair, wash your hair first as usual with your  normal  shampoo.  3.  After you shampoo, rinse your hair and body thoroughly to remove the  shampoo.                           4.  Use CHG as you would any other liquid soap.  You can apply chg directly  to the skin and wash  Gently with a scrungie or clean washcloth.  5.  Apply the CHG Soap to your body ONLY FROM THE NECK DOWN.   Do not use on face/ open                           Wound or open sores. Avoid contact with eyes, ears mouth and genitals (private parts).                       Wash face,  Genitals (private parts) with your normal soap.             6.  Wash thoroughly, paying special attention to the area where your surgery  will be performed.  7.  Thoroughly rinse your body with warm water from the neck down.  8.  DO NOT shower/wash with your normal soap after using and rinsing off  the CHG Soap.                9.  Pat yourself dry with a clean towel.            10.  Wear clean pajamas.            11.  Place clean sheets on your bed the night of your first shower and do not  sleep with pets. Day of Surgery : Do not apply any lotions/deodorants the morning of surgery.  Please wear clean clothes to the hospital/surgery center.  FAILURE TO FOLLOW THESE INSTRUCTIONS MAY RESULT IN THE CANCELLATION OF YOUR SURGERY PATIENT SIGNATURE_________________________________  NURSE SIGNATURE__________________________________  ________________________________________________________________________  WHAT IS A BLOOD TRANSFUSION? Blood Transfusion Information  A transfusion is the replacement of blood or some of its parts. Blood is made up of multiple cells which provide different functions.  Red blood cells  carry oxygen and are used for blood loss replacement.  White blood cells fight against infection.  Platelets control bleeding.  Plasma helps clot blood.  Other blood products are available for specialized needs, such as hemophilia or other clotting disorders. BEFORE THE TRANSFUSION  Who gives blood for transfusions?   Healthy volunteers who are fully evaluated to make sure their blood is safe. This is blood bank blood. Transfusion therapy is the safest it has ever been in the practice of medicine. Before blood is taken from a donor, a complete history is taken to make sure that person has no history of diseases nor engages in risky social behavior (examples are intravenous drug use or sexual activity with multiple partners). The donor's travel history is screened to minimize risk of transmitting infections, such as malaria. The donated blood is tested for signs of infectious diseases, such as HIV and hepatitis. The blood is then tested to be sure it is compatible with you in order to minimize the chance of a transfusion reaction. If you or a relative donates blood, this is often done in anticipation of surgery and is not appropriate for emergency situations. It takes many days to process the donated blood. RISKS AND COMPLICATIONS Although transfusion therapy is very safe and saves many lives, the main dangers of transfusion include:   Getting an infectious disease.  Developing a transfusion reaction. This is an allergic reaction to something in the blood you were given. Every precaution is taken to prevent this. The decision to have a blood transfusion has been considered carefully by your caregiver before blood is given. Blood is not given unless the benefits outweigh  the risks. AFTER THE TRANSFUSION  Right after receiving a blood transfusion, you will usually feel much better and more energetic. This is especially true if your red blood cells have gotten low (anemic). The transfusion raises  the level of the red blood cells which carry oxygen, and this usually causes an energy increase.  The nurse administering the transfusion will monitor you carefully for complications. HOME CARE INSTRUCTIONS  No special instructions are needed after a transfusion. You may find your energy is better. Speak with your caregiver about any limitations on activity for underlying diseases you may have. SEEK MEDICAL CARE IF:   Your condition is not improving after your transfusion.  You develop redness or irritation at the intravenous (IV) site. SEEK IMMEDIATE MEDICAL CARE IF:  Any of the following symptoms occur over the next 12 hours:  Shaking chills.  You have a temperature by mouth above 102 F (38.9 C), not controlled by medicine.  Chest, back, or muscle pain.  People around you feel you are not acting correctly or are confused.  Shortness of breath or difficulty breathing.  Dizziness and fainting.  You get a rash or develop hives.  You have a decrease in urine output.  Your urine turns a dark color or changes to pink, red, or brown. Any of the following symptoms occur over the next 10 days:  You have a temperature by mouth above 102 F (38.9 C), not controlled by medicine.  Shortness of breath.  Weakness after normal activity.  The white part of the eye turns yellow (jaundice).  You have a decrease in the amount of urine or are urinating less often.  Your urine turns a dark color or changes to pink, red, or brown. Document Released: 10/21/2000 Document Revised: 01/16/2012 Document Reviewed: 06/09/2008 ExitCare Patient Information 2014 [place].  _______________________________________________________________________  Incentive Spirometer  An incentive spirometer is a tool that can help keep your lungs clear and active. This tool measures how well you are filling your lungs with each breath. Taking long deep breaths may help reverse or decrease the chance of  developing breathing (pulmonary) problems (especially infection) following:  A long period of time when you are unable to move or be active. BEFORE THE PROCEDURE   If the spirometer includes an indicator to show your best effort, your nurse or respiratory therapist will set it to a desired goal.  If possible, sit up straight or lean slightly forward. Try not to slouch.  Hold the incentive spirometer in an upright position. INSTRUCTIONS FOR USE  1. Sit on the edge of your bed if possible, or sit up as far as you can in bed or on a chair. 2. Hold the incentive spirometer in an upright position. 3. Breathe out normally. 4. Place the mouthpiece in your mouth and Malay your lips tightly around it. 5. Breathe in slowly and as deeply as possible, raising the piston or the ball toward the top of the column. 6. Hold your breath for 3-5 seconds or for as long as possible. Allow the piston or ball to fall to the bottom of the column. 7. Remove the mouthpiece from your mouth and breathe out normally. 8. Rest for a few seconds and repeat Steps 1 through 7 at least 10 times every 1-2 hours when you are awake. Take your time and take a few normal breaths between deep breaths. 9. The spirometer may include an indicator to show your best effort. Use the indicator as a goal to work  toward during each repetition. 10. After each set of 10 deep breaths, practice coughing to be sure your lungs are clear. If you have an incision (the cut made at the time of surgery), support your incision when coughing by placing a pillow or rolled up towels firmly against it. Once you are able to get out of bed, walk around indoors and cough well. You may stop using the incentive spirometer when instructed by your caregiver.  RISKS AND COMPLICATIONS  Take your time so you do not get dizzy or light-headed.  If you are in pain, you may need to take or ask for pain medication before doing incentive spirometry. It is harder to take a  deep breath if you are having pain. AFTER USE  Rest and breathe slowly and easily.  It can be helpful to keep track of a log of your progress. Your caregiver can provide you with a simple table to help with this. If you are using the spirometer at home, follow these instructions: [place] IF:   You are having difficultly using the spirometer.  You have trouble using the spirometer as often as instructed.  Your pain medication is not giving enough relief while using the spirometer.  You develop fever of 100.5 F (38.1 C) or higher. SEEK IMMEDIATE MEDICAL CARE IF:   You cough up bloody sputum that had not been present before.  You develop fever of 102 F (38.9 C) or greater.  You develop worsening pain at or near the incision site. MAKE SURE YOU:   Understand these instructions.  Will watch your condition.  Will get help right away if you are not doing well or get worse. Document Released: 03/06/2007 Document Revised: 01/16/2012 Document Reviewed: 05/07/2007 ExitCare Patient Information 2014 ExitCare, [place].   ________________________________________________________________________    CLEAR LIQUID DIET   Foods Allowed                                                                     Foods Excluded  Coffee and tea, regular and decaf                             liquids that you cannot  Plain Jell-O in any flavor                                             see through such as: Fruit ices (not with fruit pulp)                                     milk, soups, orange juice  Iced Popsicles                                    All solid food Carbonated beverages, regular and diet  Cranberry, grape and apple juices Sports drinks like Gatorade Lightly seasoned clear broth or consume(fat free) Sugar, honey syrup  Sample Menu Breakfast                                Lunch                                     Supper Cranberry juice                     Beef broth                            Chicken broth Jell-O                                     Grape juice                           Apple juice Coffee or tea                        Jell-O                                      Popsicle                                                Coffee or tea                        Coffee or tea  _____________________________________________________________________

## 2017-03-22 ENCOUNTER — Encounter (HOSPITAL_COMMUNITY)
Admission: RE | Admit: 2017-03-22 | Discharge: 2017-03-22 | Disposition: A | Discharge status: Home / Self Care | Payer: Medicare Other | Source: Ambulatory Visit | Attending: Orthopedic Surgery | Admitting: Orthopedic Surgery

## 2017-03-22 ENCOUNTER — Encounter (HOSPITAL_COMMUNITY): Payer: Self-pay

## 2017-03-22 DIAGNOSIS — Z01810 Encounter for preprocedural cardiovascular examination: Secondary | ICD-10-CM | POA: Insufficient documentation

## 2017-03-22 DIAGNOSIS — Z01812 Encounter for preprocedural laboratory examination: Secondary | ICD-10-CM | POA: Diagnosis not present

## 2017-03-22 HISTORY — DX: Adverse effect of unspecified anesthetic, initial encounter: T41.45XA

## 2017-03-22 HISTORY — DX: Acute embolism and thrombosis of unspecified deep veins of unspecified lower extremity: I82.409

## 2017-03-22 HISTORY — DX: Other complications of anesthesia, initial encounter: T88.59XA

## 2017-03-22 HISTORY — DX: Unspecified mononeuropathy of left lower limb: G57.92

## 2017-03-22 LAB — CBC
HCT: 40.7 % (ref 36.0–46.0)
HEMOGLOBIN: 13.4 g/dL (ref 12.0–15.0)
MCH: 32.1 pg (ref 26.0–34.0)
MCHC: 32.9 g/dL (ref 30.0–36.0)
MCV: 97.4 fL (ref 78.0–100.0)
PLATELETS: 261 10*3/uL (ref 150–400)
RBC: 4.18 MIL/uL (ref 3.87–5.11)
RDW: 13.2 % (ref 11.5–15.5)
WBC: 6.9 10*3/uL (ref 4.0–10.5)

## 2017-03-22 LAB — SURGICAL PCR SCREEN
MRSA, PCR: NEGATIVE
Staphylococcus aureus: NEGATIVE

## 2017-03-22 LAB — PROTIME-INR
INR: 0.98
Prothrombin Time: 13 seconds (ref 11.4–15.2)

## 2017-03-22 LAB — COMPREHENSIVE METABOLIC PANEL
ALBUMIN: 4.1 g/dL (ref 3.5–5.0)
ALK PHOS: 73 U/L (ref 38–126)
ALT: 16 U/L (ref 14–54)
ANION GAP: 7 (ref 5–15)
AST: 26 U/L (ref 15–41)
BUN: 15 mg/dL (ref 6–20)
CALCIUM: 9.6 mg/dL (ref 8.9–10.3)
CHLORIDE: 109 mmol/L (ref 101–111)
CO2: 27 mmol/L (ref 22–32)
Creatinine, Ser: 1.19 mg/dL — ABNORMAL HIGH (ref 0.44–1.00)
GFR calc Af Amer: 49 mL/min — ABNORMAL LOW (ref >60)
GFR calc non Af Amer: 42 mL/min — ABNORMAL LOW (ref >60)
GLUCOSE: 110 mg/dL — AB (ref 65–99)
Potassium: 4.8 mmol/L (ref 3.5–5.1)
SODIUM: 143 mmol/L (ref 135–145)
Total Bilirubin: 0.6 mg/dL (ref 0.3–1.2)
Total Protein: 6.9 g/dL (ref 6.5–8.1)

## 2017-03-22 LAB — APTT: APTT: 29 s (ref 24–36)

## 2017-03-22 NOTE — Progress Notes (Signed)
Spoke with Dr. Orene Desanctis anesthesia regarding pt. New LBBB since EKG feb 2016. Faxed current EKG to Dr. Jacquiline Doe assistant Danae Chen  At [phone] per Dr. Orene Desanctis  . Dr. Reynaldo Minium to decide if we move forward at this point otherwise anesthesia will move forward per Dr. Massage . Informed Erica at Dr. Jacquiline Doe office.

## 2017-03-22 NOTE — Progress Notes (Signed)
Clearance Dr. Reynaldo Minium 12/31/15 on chart.

## 2017-03-22 NOTE — Progress Notes (Signed)
Per Mellody Drown with Dr. Reynaldo Minium pt. Is close to baseline , and is ok to prodceed with surgery.

## 2017-03-28 ENCOUNTER — Ambulatory Visit: Payer: Self-pay | Admitting: Orthopedic Surgery

## 2017-03-28 NOTE — H&P (Signed)
Stacy Parker DOB: 01/03/1934 Married / Language: English / Race: White Female Date of Admission:  03/29/2017 CC:  Right Hip pain History of Present Illness The patient is a 83 year old female who comes in for a preoperative History and Physical. The patient is scheduled for a right total hip arthroplasty (anterior) to be performed by Dr. Dione Plover. Aluisio, MD at [facility] on 03/29/17. The patient is a 83 year old female who presented for follow up of their hip. The patient is being followed for their right hip pain and osteoarthritis. They are out from intra-articular injection. Symptoms reported include: pain, aching, stiffness and difficulty ambulating. The patient feels that they are doing poorly. The following medication has been used for pain control: antiinflammatory medication (ibuprofen, prn). The patient has reported improvement of their symptoms with: Cortisone injections (helped some). She would like to go ahead and proceed with a THA. Unfortunately, the intraarticular injection did not provide any substantial long-term benefit. She said for the first 3 to 4 days, she had about 30% pain resolution, but that went away quickly. She is back to hurting like she was before. It is in the groin, anterior thigh, radiating towards the knee. She is having more functional issues with the hip also. She is ready for surgery at this time. They have been treated conservatively in the past for the above stated problem and despite conservative measures, they continue to have progressive pain and severe functional limitations and dysfunction. They have failed non-operative management including home exercise, medications, and injections. It is felt that they would benefit from undergoing total joint replacement. Risks and benefits of the procedure have been discussed with the patient and they elect to proceed with surgery. There are no active contraindications to surgery such as ongoing infection  or rapidly progressive neurological disease.   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)  Bursitis of right hip (M70.71)  Pain of right hip joint (M25.551)  Primary osteoarthritis of right hip (M16.11)  High blood pressure  Skin Cancer  Sleep Apnea  uses CPAP Blood Clot  Following previous back surgery Measles  Mumps  Menopause  Urinary Tract Infection  Cataract  Hiatal Hernia  Gastroesophageal Reflux Disease    Allergies Naprosyn *ANALGESICS - ANTI-INFLAMMATORY*  Keflex *CEPHALOSPORINS*  Phenergan *ANTIHISTAMINES*  Flagyl *ANTI-INFECTIVE AGENTS - MISC.*  Fosamax *ENDOCRINE AND METABOLIC AGENTS - MISC.*  Boniva *ENDOCRINE AND METABOLIC AGENTS - MISC.*  Doxycycline Hyclate *TETRACYCLINES*  Succinylcholine Chloride *NEUROMUSCULAR AGENTS*  polymyalgia Cymbalta *ANTIDEPRESSANTS*  Nausea.  Family History Cancer  father, grandmother mothers side and grandfather mothers side Chronic Obstructive Lung Disease  mother Heart disease in female family member before age 18  Heart Disease  Father. father Congestive Heart Failure  First Degree Relatives. father  Social History Tobacco use  Former smoker. former smoker; smoke(d) less than 1/2 pack(s) per day; uses less than half 1/2 can(s) smokeless per week Number of flights of stairs before winded  1 Marital status  married Living situation  live with spouse Tobacco / smoke exposure  no Pain Contract  yes Illicit drug use  no Current work status  retired Children  3 Alcohol use  current drinker; drinks beer, wine and hard liquor; only occasionally per week Exercise  Exercises weekly; does other Drug/Alcohol Rehab (Previously)  no Drug/Alcohol  Rehab (Currently)  no Advance  Directives  Living Will, Healthcare POA  Medication History Ibuprofen (200MG  Tablet, Oral as needed) Active. Pantoprazole Sodium (40MG  Tablet DR, Oral) Active. Biotin (5000MCG Capsule, Oral) Active. Calcium Citrate (Oral) Specific strength unknown - Active. Fish Oil Burp-Less (1200MG  Capsule, Oral) Active. Multivitamins (Oral) Active. Robaxin (500MG  Tablet, Oral) Active. (prn) Vitamin C CR (500MG  Capsule ER, Oral) Active. Vitamin D (1000UNIT Tablet, Oral) Active. AmLODIPine Besylate Specific strength unknown - Active. Align (Oral) Active. Melatonin (10MG  Capsule, Oral) Active. Citalopram Hydrobromide (20MG  Tablet, Oral) Active. Quinine Sulfate (300MG  Capsule, Oral) Active. Finasteride (1MG  Tablet, Oral) Active. Aspirin EC (81MG  Tablet DR, Oral) Active.   Past Surgical History  Breast Biopsy  right, multiple times Cataract Surgery  bilateral Hip Fracture and Surgery  left Hysterectomy  complete (non-cancerous) Spinal Fusion  lower back Spinal Surgery  Total Hip Replacement  left Tubal Ligation  Placement of Neurostimulator   Review of Systems General Not Present- Chills, Fatigue, Fever, Memory Loss, Night Sweats, Weight Gain and Weight Loss. Skin Not Present- Eczema, Hives, Itching, Lesions and Rash. HEENT Not Present- Dentures, Double Vision, Headache, Hearing Loss, Tinnitus and Visual Loss. Respiratory Not Present- Allergies, Chronic Cough, Coughing up blood, Shortness of breath at rest and Shortness of breath with exertion. Cardiovascular Not Present- Chest Pain, Difficulty Breathing Lying Down, Murmur, Palpitations, Racing/skipping heartbeats and Swelling. Gastrointestinal Not Present- Abdominal Pain, Bloody Stool, Constipation, Diarrhea, Difficulty Swallowing, Heartburn, Jaundice, Loss of appetitie, Nausea and Vomiting. Female Genitourinary Not Present- Blood in Urine, Discharge, Flank Pain, Incontinence, Painful Urination, Urgency, Urinary  frequency, Urinary Retention, Urinating at Night and Weak urinary stream. Musculoskeletal Present- Joint Pain and Morning Stiffness. Not Present- Back Pain, Joint Swelling, Muscle Pain, Muscle Weakness and Spasms. Neurological Not Present- Blackout spells, Difficulty with balance, Dizziness, Paralysis, Tremor and Weakness. Psychiatric Not Present- Insomnia.  Vitals  Weight: 149 lb Height: 63in Weight was reported by patient. Height was reported by patient. Body Surface Area: 1.71 m Body Mass Index: 26.39 kg/m  Pulse: 64 (Regular)  Resp.: 12 (Unlabored)  BP: 128/70 (Sitting, Left Arm, Standard)       Physical Exam  General Mental Status -Alert, cooperative and good historian. General Appearance-pleasant, Not in acute distress. Orientation-Oriented X3. Build & Nutrition-Well nourished and Well developed.  Head and Neck Head-normocephalic, atraumatic . Neck Global Assessment - supple, no bruit auscultated on the right, no bruit auscultated on the left.  Eye Pupil - Bilateral-Regular and Round. Motion - Bilateral-EOMI.  Chest and Lung Exam Auscultation Breath sounds - clear at anterior chest wall and clear at posterior chest wall. Adventitious sounds - No Adventitious sounds.  Cardiovascular Auscultation Rhythm - Regular rate and rhythm. Heart Sounds - S1 WNL and S2 WNL. Murmurs & Other Heart Sounds - Auscultation of the heart reveals - No Murmurs.  Abdomen Palpation/Percussion Tenderness - Abdomen is non-tender to palpation. Rigidity (guarding) - Abdomen is soft. Auscultation Auscultation of the abdomen reveals - Bowel sounds normal.  Female Genitourinary Note: Not done, not pertinent to present illness   Musculoskeletal Note: She is alert and oriented, no apparent distress. Right hip can be flexed to 110, rotated in 10, out 20, abduct 20 without discomfort. Left hip flexed 120, rotated in 30, out 40, abduction 40 without discomfort. She  has a significantly antalgic gait pattern on the right.     Assessment & Plan Primary osteoarthritis of right hip (M16.11)  Note:Surgical Plans: Right Total Hip Replacement - Anterior Approach  Disposition: Home with husband  PCP: Dr.  Aronson  Topical TXA  Anesthesia Issues: Reaction to Succinycholine with a previous surgery  Patient was instructed on what medications to stop prior to surgery.  Signed electronically by Joelene Millin, III PA-C

## 2017-03-29 ENCOUNTER — Encounter (HOSPITAL_COMMUNITY): Payer: Self-pay | Admitting: *Deleted

## 2017-03-29 ENCOUNTER — Encounter (HOSPITAL_COMMUNITY)
Admission: RE | Disposition: A | Discharge status: Skilled Nursing Facility | Payer: Self-pay | Source: Ambulatory Visit | Attending: Orthopedic Surgery

## 2017-03-29 ENCOUNTER — Inpatient Hospital Stay (HOSPITAL_COMMUNITY): Payer: Medicare Other | Admitting: Anesthesiology

## 2017-03-29 ENCOUNTER — Inpatient Hospital Stay (HOSPITAL_COMMUNITY): Payer: Medicare Other

## 2017-03-29 ENCOUNTER — Inpatient Hospital Stay (HOSPITAL_COMMUNITY)
Admission: RE | Admit: 2017-03-29 | Discharge: 2017-03-31 | DRG: 470 | Disposition: A | Discharge status: Skilled Nursing Facility | Payer: Medicare Other | Source: Ambulatory Visit | Attending: Orthopedic Surgery | Admitting: Orthopedic Surgery

## 2017-03-29 DIAGNOSIS — Z96641 Presence of right artificial hip joint: Secondary | ICD-10-CM | POA: Diagnosis not present

## 2017-03-29 DIAGNOSIS — R262 Difficulty in walking, not elsewhere classified: Secondary | ICD-10-CM | POA: Diagnosis not present

## 2017-03-29 DIAGNOSIS — Z7982 Long term (current) use of aspirin: Secondary | ICD-10-CM

## 2017-03-29 DIAGNOSIS — Z79899 Other long term (current) drug therapy: Secondary | ICD-10-CM | POA: Diagnosis not present

## 2017-03-29 DIAGNOSIS — H269 Unspecified cataract: Secondary | ICD-10-CM | POA: Diagnosis not present

## 2017-03-29 DIAGNOSIS — K222 Esophageal obstruction: Secondary | ICD-10-CM | POA: Diagnosis not present

## 2017-03-29 DIAGNOSIS — Z96642 Presence of left artificial hip joint: Secondary | ICD-10-CM | POA: Diagnosis present

## 2017-03-29 DIAGNOSIS — Z880 Allergy status to penicillin: Secondary | ICD-10-CM

## 2017-03-29 DIAGNOSIS — K449 Diaphragmatic hernia without obstruction or gangrene: Secondary | ICD-10-CM | POA: Diagnosis not present

## 2017-03-29 DIAGNOSIS — Z90710 Acquired absence of both cervix and uterus: Secondary | ICD-10-CM | POA: Diagnosis not present

## 2017-03-29 DIAGNOSIS — M6281 Muscle weakness (generalized): Secondary | ICD-10-CM | POA: Diagnosis not present

## 2017-03-29 DIAGNOSIS — G4733 Obstructive sleep apnea (adult) (pediatric): Secondary | ICD-10-CM | POA: Diagnosis not present

## 2017-03-29 DIAGNOSIS — R2681 Unsteadiness on feet: Secondary | ICD-10-CM | POA: Diagnosis not present

## 2017-03-29 DIAGNOSIS — M169 Osteoarthritis of hip, unspecified: Secondary | ICD-10-CM | POA: Diagnosis present

## 2017-03-29 DIAGNOSIS — Z96649 Presence of unspecified artificial hip joint: Secondary | ICD-10-CM

## 2017-03-29 DIAGNOSIS — K219 Gastro-esophageal reflux disease without esophagitis: Secondary | ICD-10-CM | POA: Diagnosis not present

## 2017-03-29 DIAGNOSIS — Z87891 Personal history of nicotine dependence: Secondary | ICD-10-CM

## 2017-03-29 DIAGNOSIS — M1990 Unspecified osteoarthritis, unspecified site: Secondary | ICD-10-CM | POA: Diagnosis not present

## 2017-03-29 DIAGNOSIS — Z471 Aftercare following joint replacement surgery: Secondary | ICD-10-CM | POA: Diagnosis not present

## 2017-03-29 DIAGNOSIS — M1611 Unilateral primary osteoarthritis, right hip: Principal | ICD-10-CM | POA: Diagnosis present

## 2017-03-29 HISTORY — PX: TOTAL HIP ARTHROPLASTY: SHX124

## 2017-03-29 LAB — TYPE AND SCREEN
ABO/RH(D): O NEG
Antibody Screen: NEGATIVE

## 2017-03-29 SURGERY — ARTHROPLASTY, HIP, TOTAL, ANTERIOR APPROACH
Anesthesia: General | Site: Hip | Laterality: Right

## 2017-03-29 MED ORDER — FLEET ENEMA 7-19 GM/118ML RE ENEM: 1.0000 | ENEMA | Freq: Once | RECTAL | Status: DC | PRN

## 2017-03-29 MED ORDER — OXYCODONE HCL 5 MG/5ML PO SOLN
5.0000 mg | Freq: Once | ORAL | Status: DC | PRN
  Filled 2017-03-29: qty 5

## 2017-03-29 MED ORDER — LIDOCAINE 2% (20 MG/ML) 5 ML SYRINGE
INTRAMUSCULAR | Status: DC | PRN
  Administered 2017-03-29: 60 mg via INTRAVENOUS

## 2017-03-29 MED ORDER — OXYCODONE HCL 5 MG PO TABS: 5.0000 mg | ORAL_TABLET | Freq: Once | ORAL | Status: DC | PRN

## 2017-03-29 MED ORDER — CHLORHEXIDINE GLUCONATE 4 % EX LIQD: 60.0000 mL | Freq: Once | CUTANEOUS | Status: DC

## 2017-03-29 MED ORDER — METOCLOPRAMIDE HCL 5 MG PO TABS: 5.0000 mg | ORAL_TABLET | Freq: Three times a day (TID) | ORAL | Status: DC | PRN

## 2017-03-29 MED ORDER — PROPOFOL 10 MG/ML IV BOLUS
INTRAVENOUS | Status: DC | PRN
  Administered 2017-03-29: 150 mg via INTRAVENOUS
  Administered 2017-03-29: 50 mg via INTRAVENOUS

## 2017-03-29 MED ORDER — DEXAMETHASONE SODIUM PHOSPHATE 10 MG/ML IJ SOLN
10.0000 mg | Freq: Once | INTRAMUSCULAR | Status: AC
  Administered 2017-03-30: 10 mg via INTRAVENOUS
  Filled 2017-03-29: qty 1

## 2017-03-29 MED ORDER — ACETAMINOPHEN 500 MG PO TABS
1000.0000 mg | ORAL_TABLET | Freq: Four times a day (QID) | ORAL | Status: AC
  Administered 2017-03-29 – 2017-03-30 (×4): 1000 mg via ORAL
  Filled 2017-03-29 (×4): qty 2

## 2017-03-29 MED ORDER — ONDANSETRON HCL 4 MG/2ML IJ SOLN
INTRAMUSCULAR | Status: AC
  Filled 2017-03-29: qty 2

## 2017-03-29 MED ORDER — ONDANSETRON HCL 4 MG/2ML IJ SOLN: 4.0000 mg | Freq: Four times a day (QID) | INTRAMUSCULAR | Status: DC | PRN

## 2017-03-29 MED ORDER — VANCOMYCIN HCL IN DEXTROSE 1-5 GM/200ML-% IV SOLN
1000.0000 mg | Freq: Two times a day (BID) | INTRAVENOUS | Status: AC
  Administered 2017-03-29: 23:00:00 1000 mg via INTRAVENOUS
  Filled 2017-03-29: qty 200

## 2017-03-29 MED ORDER — LIDOCAINE 2% (20 MG/ML) 5 ML SYRINGE
INTRAMUSCULAR | Status: AC
  Filled 2017-03-29: qty 10

## 2017-03-29 MED ORDER — SODIUM CHLORIDE 0.9 % IR SOLN
Status: DC | PRN
  Administered 2017-03-29: 1000 mL

## 2017-03-29 MED ORDER — HYDROMORPHONE HCL 1 MG/ML IJ SOLN
INTRAMUSCULAR | Status: AC
  Filled 2017-03-29: qty 1

## 2017-03-29 MED ORDER — TRANEXAMIC ACID 1000 MG/10ML IV SOLN
INTRAVENOUS | Status: AC | PRN
  Administered 2017-03-29: 2000 mg via TOPICAL

## 2017-03-29 MED ORDER — BISACODYL 10 MG RE SUPP
10.0000 mg | Freq: Every day | RECTAL | Status: DC | PRN
Start: 2017-03-29 — End: 2017-03-31

## 2017-03-29 MED ORDER — OXYCODONE HCL 5 MG PO TABS
5.0000 mg | ORAL_TABLET | ORAL | Status: DC | PRN
  Administered 2017-03-29 – 2017-03-31 (×9): 10 mg via ORAL
  Filled 2017-03-29 (×9): qty 2

## 2017-03-29 MED ORDER — PROPOFOL 10 MG/ML IV BOLUS
INTRAVENOUS | Status: AC
  Filled 2017-03-29: qty 20

## 2017-03-29 MED ORDER — PROMETHAZINE HCL 25 MG/ML IJ SOLN: 6.2500 mg | INTRAMUSCULAR | Status: DC | PRN

## 2017-03-29 MED ORDER — METHOCARBAMOL 500 MG PO TABS
500.0000 mg | ORAL_TABLET | Freq: Four times a day (QID) | ORAL | Status: DC | PRN
  Administered 2017-03-29 – 2017-03-31 (×3): 500 mg via ORAL
  Filled 2017-03-29 (×3): qty 1

## 2017-03-29 MED ORDER — FENTANYL CITRATE (PF) 100 MCG/2ML IJ SOLN
INTRAMUSCULAR | Status: DC | PRN
  Administered 2017-03-29: 50 ug via INTRAVENOUS
  Administered 2017-03-29 (×4): 25 ug via INTRAVENOUS
  Administered 2017-03-29: 50 ug via INTRAVENOUS

## 2017-03-29 MED ORDER — CITALOPRAM HYDROBROMIDE 20 MG PO TABS
40.0000 mg | ORAL_TABLET | Freq: Every day | ORAL | Status: DC
  Administered 2017-03-30 – 2017-03-31 (×2): 40 mg via ORAL
  Filled 2017-03-29 (×2): qty 2

## 2017-03-29 MED ORDER — LACTATED RINGERS IV SOLN
INTRAVENOUS | Status: DC
  Administered 2017-03-29 (×2): via INTRAVENOUS

## 2017-03-29 MED ORDER — DOCUSATE SODIUM 100 MG PO CAPS
100.0000 mg | ORAL_CAPSULE | Freq: Two times a day (BID) | ORAL | Status: DC
  Administered 2017-03-29 – 2017-03-31 (×4): 100 mg via ORAL
  Filled 2017-03-29 (×4): qty 1

## 2017-03-29 MED ORDER — ONDANSETRON HCL 4 MG PO TABS
4.0000 mg | ORAL_TABLET | Freq: Four times a day (QID) | ORAL | Status: DC | PRN
  Filled 2017-03-29: qty 1

## 2017-03-29 MED ORDER — ACETAMINOPHEN 650 MG RE SUPP
650.0000 mg | Freq: Four times a day (QID) | RECTAL | Status: DC | PRN
Start: 2017-03-30 — End: 2017-03-31

## 2017-03-29 MED ORDER — DEXAMETHASONE SODIUM PHOSPHATE 10 MG/ML IJ SOLN: 10.0000 mg | Freq: Once | INTRAMUSCULAR | Status: DC

## 2017-03-29 MED ORDER — HYDROMORPHONE HCL 1 MG/ML IJ SOLN
0.2500 mg | INTRAMUSCULAR | Status: DC | PRN
  Administered 2017-03-29 (×2): 0.5 mg via INTRAVENOUS

## 2017-03-29 MED ORDER — VANCOMYCIN HCL IN DEXTROSE 1-5 GM/200ML-% IV SOLN
1000.0000 mg | INTRAVENOUS | Status: AC
  Administered 2017-03-29: 1000 mg via INTRAVENOUS

## 2017-03-29 MED ORDER — DEXTROSE 5 % IV SOLN
500.0000 mg | Freq: Four times a day (QID) | INTRAVENOUS | Status: DC | PRN
  Filled 2017-03-29: qty 5

## 2017-03-29 MED ORDER — DEXAMETHASONE SODIUM PHOSPHATE 10 MG/ML IJ SOLN
INTRAMUSCULAR | Status: AC
  Filled 2017-03-29: qty 1

## 2017-03-29 MED ORDER — FINASTERIDE 5 MG PO TABS
2.5000 mg | ORAL_TABLET | Freq: Every day | ORAL | Status: DC
  Administered 2017-03-29 – 2017-03-30 (×2): 2.5 mg via ORAL
  Filled 2017-03-29 (×2): qty 1

## 2017-03-29 MED ORDER — MENTHOL 3 MG MT LOZG: 1.0000 | LOZENGE | OROMUCOSAL | Status: DC | PRN

## 2017-03-29 MED ORDER — ACETAMINOPHEN 10 MG/ML IV SOLN
INTRAVENOUS | Status: AC
  Filled 2017-03-29: qty 100

## 2017-03-29 MED ORDER — FENTANYL CITRATE (PF) 100 MCG/2ML IJ SOLN
INTRAMUSCULAR | Status: AC
  Filled 2017-03-29: qty 2

## 2017-03-29 MED ORDER — TRANEXAMIC ACID 1000 MG/10ML IV SOLN
2000.0000 mg | Freq: Once | INTRAVENOUS | Status: DC
  Filled 2017-03-29: qty 20

## 2017-03-29 MED ORDER — DEXAMETHASONE SODIUM PHOSPHATE 10 MG/ML IJ SOLN
INTRAMUSCULAR | Status: DC | PRN
  Administered 2017-03-29: 10 mg via INTRAVENOUS

## 2017-03-29 MED ORDER — SODIUM CHLORIDE 0.9 % IV SOLN
INTRAVENOUS | Status: DC
  Administered 2017-03-29 – 2017-03-30 (×2): via INTRAVENOUS

## 2017-03-29 MED ORDER — PHENOL 1.4 % MT LIQD: 1.0000 | OROMUCOSAL | Status: DC | PRN

## 2017-03-29 MED ORDER — VANCOMYCIN HCL IN DEXTROSE 1-5 GM/200ML-% IV SOLN
INTRAVENOUS | Status: AC
  Administered 2017-03-29: 1000 mg via INTRAVENOUS
  Filled 2017-03-29: qty 200

## 2017-03-29 MED ORDER — RIVAROXABAN 10 MG PO TABS
10.0000 mg | ORAL_TABLET | Freq: Every day | ORAL | Status: DC
  Administered 2017-03-30 – 2017-03-31 (×2): 10 mg via ORAL
  Filled 2017-03-29 (×2): qty 1

## 2017-03-29 MED ORDER — AMLODIPINE BESYLATE 5 MG PO TABS
5.0000 mg | ORAL_TABLET | Freq: Every evening | ORAL | Status: DC
  Administered 2017-03-29 – 2017-03-30 (×2): 5 mg via ORAL
  Filled 2017-03-29 (×2): qty 1

## 2017-03-29 MED ORDER — DIPHENHYDRAMINE HCL 12.5 MG/5ML PO ELIX: 12.5000 mg | ORAL_SOLUTION | ORAL | Status: DC | PRN

## 2017-03-29 MED ORDER — EPHEDRINE 5 MG/ML INJ
INTRAVENOUS | Status: AC
  Filled 2017-03-29: qty 10

## 2017-03-29 MED ORDER — ACETAMINOPHEN 325 MG PO TABS: 650.0000 mg | ORAL_TABLET | Freq: Four times a day (QID) | ORAL | Status: DC | PRN

## 2017-03-29 MED ORDER — BUPIVACAINE HCL (PF) 0.25 % IJ SOLN
INTRAMUSCULAR | Status: AC
  Filled 2017-03-29: qty 30

## 2017-03-29 MED ORDER — ONDANSETRON HCL 4 MG/2ML IJ SOLN
INTRAMUSCULAR | Status: DC | PRN
  Administered 2017-03-29: 4 mg via INTRAVENOUS

## 2017-03-29 MED ORDER — BUPIVACAINE HCL (PF) 0.25 % IJ SOLN
INTRAMUSCULAR | Status: DC | PRN
  Administered 2017-03-29: 30 mL

## 2017-03-29 MED ORDER — POLYETHYLENE GLYCOL 3350 17 G PO PACK: 17.0000 g | PACK | Freq: Every day | ORAL | Status: DC | PRN

## 2017-03-29 MED ORDER — METOCLOPRAMIDE HCL 5 MG/ML IJ SOLN: 5.0000 mg | Freq: Three times a day (TID) | INTRAMUSCULAR | Status: DC | PRN

## 2017-03-29 MED ORDER — MORPHINE SULFATE (PF) 4 MG/ML IV SOLN: 1.0000 mg | INTRAVENOUS | Status: DC | PRN

## 2017-03-29 MED ORDER — EPHEDRINE SULFATE 50 MG/ML IJ SOLN
INTRAMUSCULAR | Status: DC | PRN
  Administered 2017-03-29: 10 mg via INTRAVENOUS

## 2017-03-29 MED ORDER — ACETAMINOPHEN 10 MG/ML IV SOLN
1000.0000 mg | Freq: Once | INTRAVENOUS | Status: AC
  Administered 2017-03-29: 1000 mg via INTRAVENOUS

## 2017-03-29 MED ORDER — PANTOPRAZOLE SODIUM 40 MG PO TBEC
40.0000 mg | DELAYED_RELEASE_TABLET | Freq: Every day | ORAL | Status: DC
  Administered 2017-03-29: 22:00:00 40 mg via ORAL
  Filled 2017-03-29: qty 1

## 2017-03-29 SURGICAL SUPPLY — 36 items
BAG DECANTER FOR FLEXI CONT (MISCELLANEOUS) ×2 IMPLANT
BAG SPEC THK2 15X12 ZIP CLS (MISCELLANEOUS)
BAG ZIPLOCK 12X15 (MISCELLANEOUS) IMPLANT
BLADE SAG 18X100X1.27 (BLADE) ×2 IMPLANT
CAPT HIP TOTAL 2 ×1 IMPLANT
CLOTH BEACON ORANGE TIMEOUT ST (SAFETY) ×2 IMPLANT
COVER PERINEAL POST (MISCELLANEOUS) ×2 IMPLANT
COVER SURGICAL LIGHT HANDLE (MISCELLANEOUS) ×2 IMPLANT
DECANTER SPIKE VIAL GLASS SM (MISCELLANEOUS) ×2 IMPLANT
DRAPE STERI IOBAN 125X83 (DRAPES) ×2 IMPLANT
DRAPE U-SHAPE 47X51 STRL (DRAPES) ×4 IMPLANT
DRSG ADAPTIC 3X8 NADH LF (GAUZE/BANDAGES/DRESSINGS) ×2 IMPLANT
DRSG MEPILEX BORDER 4X4 (GAUZE/BANDAGES/DRESSINGS) ×2 IMPLANT
DRSG MEPILEX BORDER 4X8 (GAUZE/BANDAGES/DRESSINGS) ×2 IMPLANT
DURAPREP 26ML APPLICATOR (WOUND CARE) ×2 IMPLANT
ELECT REM PT RETURN 15FT ADLT (MISCELLANEOUS) ×2 IMPLANT
EVACUATOR 1/8 PVC DRAIN (DRAIN) ×2 IMPLANT
GLOVE BIO SURGEON STRL SZ7.5 (GLOVE) ×2 IMPLANT
GLOVE BIO SURGEON STRL SZ8 (GLOVE) ×4 IMPLANT
GLOVE BIOGEL PI IND STRL 7.5 (GLOVE) IMPLANT
GLOVE BIOGEL PI IND STRL 8 (GLOVE) ×2 IMPLANT
GLOVE BIOGEL PI INDICATOR 7.5 (GLOVE) ×4
GLOVE BIOGEL PI INDICATOR 8 (GLOVE) ×2
GOWN STRL REUS W/TWL LRG LVL3 (GOWN DISPOSABLE) ×2 IMPLANT
GOWN STRL REUS W/TWL XL LVL3 (GOWN DISPOSABLE) ×2 IMPLANT
PACK ANTERIOR HIP CUSTOM (KITS) ×2 IMPLANT
STRIP CLOSURE SKIN 1/2X4 (GAUZE/BANDAGES/DRESSINGS) ×2 IMPLANT
SUT ETHIBOND NAB CT1 #1 30IN (SUTURE) ×2 IMPLANT
SUT MNCRL AB 4-0 PS2 18 (SUTURE) ×2 IMPLANT
SUT STRATAFIX 0 PDS 27 VIOLET (SUTURE) ×2
SUT VIC AB 2-0 CT1 27 (SUTURE) ×6
SUT VIC AB 2-0 CT1 TAPERPNT 27 (SUTURE) ×2 IMPLANT
SUTURE STRATFX 0 PDS 27 VIOLET (SUTURE) ×1 IMPLANT
SYR 50ML LL SCALE MARK (SYRINGE) IMPLANT
TRAY FOLEY CATH 14FR (SET/KITS/TRAYS/PACK) ×1 IMPLANT
YANKAUER SUCT BULB TIP 10FT TU (MISCELLANEOUS) ×2 IMPLANT

## 2017-03-29 NOTE — Interval H&P Note (Signed)
History and Physical Interval Note:  03/29/2017 1:11 PM  Stacy Parker  has presented today for surgery, with the diagnosis of Osteoarthritis Right hip  The various methods of treatment have been discussed with the patient and family. After consideration of risks, benefits and other options for treatment, the patient has consented to  Procedure(s): RIGHT TOTAL HIP ARTHROPLASTY ANTERIOR APPROACH (Right) as a surgical intervention .  The patient's history has been reviewed, patient examined, no change in status, stable for surgery.  I have reviewed the patient's chart and labs.  Questions were answered to the patient's satisfaction.     Gearlean Alf

## 2017-03-29 NOTE — Op Note (Signed)
OPERATIVE REPORT- TOTAL HIP ARTHROPLASTY   PREOPERATIVE DIAGNOSIS: Osteoarthritis of the Right hip.   POSTOPERATIVE DIAGNOSIS: Osteoarthritis of the Right  hip.   PROCEDURE: Right total hip arthroplasty, anterior approach.   SURGEON: Gaynelle Arabian, MD   ASSISTANT: Arlee Muslim, PA-C  ANESTHESIA:  General  ESTIMATED BLOOD LOSS:-650 ml   DRAINS: Hemovac x1.   COMPLICATIONS: None   CONDITION: PACU - hemodynamically stable.   BRIEF CLINICAL NOTE: Stacy Parker is a 83 y.o. female who has advanced end-  stage arthritis of their Right  hip with progressively worsening pain and  dysfunction.The patient has failed nonoperative management and presents for  total hip arthroplasty.   PROCEDURE IN DETAIL: After successful administration of spinal  anesthetic, the traction boots for the [facility] bed were placed on both  feet and the patient was placed onto the [facility] bed, boots placed into the leg  holders. The Right hip was then isolated from the perineum with plastic  drapes and prepped and draped in the usual sterile fashion. ASIS and  greater trochanter were marked and a oblique incision was made, starting  at about 1 cm lateral and 2 cm distal to the ASIS and coursing towards  the anterior cortex of the femur. The skin was cut with a 10 blade  through subcutaneous tissue to the level of the fascia overlying the  tensor fascia lata muscle. The fascia was then incised in line with the  incision at the junction of the anterior third and posterior 2/3rd. The  muscle was teased off the fascia and then the interval between the TFL  and the rectus was developed. The Hohmann retractor was then placed at  the top of the femoral neck over the capsule. The vessels overlying the  capsule were cauterized and the fat on top of the capsule was removed.  A Hohmann retractor was then placed anterior underneath the rectus  femoris to give exposure to the entire anterior capsule. A T-shaped   capsulotomy was performed. The edges were tagged and the femoral head  was identified.       Osteophytes are removed off the superior acetabulum.  The femoral neck was then cut in situ with an oscillating saw. Traction  was then applied to the left lower extremity utilizing the [facility]  traction. The femoral head was then removed. Retractors were placed  around the acetabulum and then circumferential removal of the labrum was  performed. Osteophytes were also removed. Reaming starts at 43 mm to  medialize and  Increased in 2 mm increments to 47 mm. We reamed in  approximately 40 degrees of abduction, 20 degrees anteversion. A 48 mm  pinnacle acetabular shell was then impacted in anatomic position under  fluoroscopic guidance with excellent purchase. We did not need to place  any additional dome screws. A 28 mm neutral + 4 marathon liner was then  placed into the acetabular shell.       The femoral lift was then placed along the lateral aspect of the femur  just distal to the vastus ridge. The leg was  externally rotated and capsule  was stripped off the inferior aspect of the femoral neck down to the  level of the lesser trochanter, this was done with electrocautery. The femur was lifted after this was performed. The  leg was then placed in an extended and adducted position essentially delivering the femur. We also removed the capsule superiorly and the piriformis from the piriformis fossa  to gain excellent exposure of the  proximal femur. Rongeur was used to remove some cancellous bone to get  into the lateral portion of the proximal femur for placement of the  initial starter reamer. The starter broaches was placed  the starter broach  and was shown to go down the center of the canal. Broaching  with the  Corail system was then performed starting at size 8, coursing  Up to size 10. A size 10 had excellent torsional and rotational  and axial stability. The trial high offset neck was then  placed  with a 28 + 1.5 trial head. The hip was then reduced. We confirmed that  the stem was in the canal both on AP and lateral x-rays. It also has excellent sizing. The hip was reduced with outstanding stability through full extension and full external rotation.. AP pelvis was taken and the leg lengths were measured and found to be equal. Hip was then dislocated again and the femoral head and neck removed. The  femoral broach was removed. Size 10 Corail stem with a high offset  neck was then impacted into the femur following native anteversion. Has  excellent purchase in the canal. Excellent torsional and rotational and  axial stability. It is confirmed to be in the canal on AP and lateral  fluoroscopic views. The 28 + 1.5 ceramic head was placed and the hip  reduced with outstanding stability. Again AP pelvis was taken and it  confirmed that the leg lengths were equal. The wound was then copiously  irrigated with saline solution and the capsule reattached and repaired  with Ethibond suture. 30 ml of .25% Bupivicaine was  injected into the capsule and into the edge of the tensor fascia lata as well as subcutaneous tissue. The fascia overlying the tensor fascia lata was then closed with a running #1 V-Loc. Subcu was closed with interrupted 2-0 Vicryl and subcuticular running 4-0 Monocryl. Incision was cleaned  and dried. Steri-Strips and a bulky sterile dressing applied. Hemovac  drain was hooked to suction and then the patient was awakened and transported to  recovery in stable condition.        Please note that a surgical assistant was a medical necessity for this procedure to perform it in a safe and expeditious manner. Assistant was necessary to provide appropriate retraction of vital neurovascular structures and to prevent femoral fracture and allow for anatomic placement of the prosthesis.  Gaynelle Arabian, M.D.

## 2017-03-29 NOTE — H&P (View-Only) (Signed)
Stacy Parker DOB: [birth year] Married / Language: English / Race: White Female Date of Admission:  03/29/2017 CC:  Right Hip pain History of Present Illness The patient is a 83 year old female who comes in for a preoperative History and Physical. The patient is scheduled for a right total hip arthroplasty (anterior) to be performed by Dr. Dione Plover. Aluisio, MD at [facility] on 03/29/17. The patient is a 83 year old female who presented for follow up of their hip. The patient is being followed for their right hip pain and osteoarthritis. They are out from intra-articular injection. Symptoms reported include: pain, aching, stiffness and difficulty ambulating. The patient feels that they are doing poorly. The following medication has been used for pain control: antiinflammatory medication (ibuprofen, prn). The patient has reported improvement of their symptoms with: Cortisone injections (helped some). She would like to go ahead and proceed with a THA. Unfortunately, the intraarticular injection did not provide any substantial long-term benefit. She said for the first 3 to 4 days, she had about 30% pain resolution, but that went away quickly. She is back to hurting like she was before. It is in the groin, anterior thigh, radiating towards the knee. She is having more functional issues with the hip also. She is ready for surgery at this time. They have been treated conservatively in the past for the above stated problem and despite conservative measures, they continue to have progressive pain and severe functional limitations and dysfunction. They have failed non-operative management including home exercise, medications, and injections. It is felt that they would benefit from undergoing total joint replacement. Risks and benefits of the procedure have been discussed with the patient and they elect to proceed with surgery. There are no active contraindications to surgery such as ongoing infection  or rapidly progressive neurological disease.   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)  Bursitis of right hip (M70.71)  Pain of right hip joint (M25.551)  Primary osteoarthritis of right hip (M16.11)  High blood pressure  Skin Cancer  Sleep Apnea  uses CPAP Blood Clot  Following previous back surgery Measles  Mumps  Menopause  Urinary Tract Infection  Cataract  Hiatal Hernia  Gastroesophageal Reflux Disease    Allergies Naprosyn *ANALGESICS - ANTI-INFLAMMATORY*  Keflex *CEPHALOSPORINS*  Phenergan *ANTIHISTAMINES*  Flagyl *ANTI-INFECTIVE AGENTS - MISC.*  Fosamax *ENDOCRINE AND METABOLIC AGENTS - MISC.*  Boniva *ENDOCRINE AND METABOLIC AGENTS - MISC.*  Doxycycline Hyclate *TETRACYCLINES*  Succinylcholine Chloride *NEUROMUSCULAR AGENTS*  polymyalgia Cymbalta *ANTIDEPRESSANTS*  Nausea.  Family History Cancer  father, grandmother mothers side and grandfather mothers side Chronic Obstructive Lung Disease  mother Heart disease in female family member before age 32  Heart Disease  Father. father Congestive Heart Failure  First Degree Relatives. father  Social History Tobacco use  Former smoker. former smoker; smoke(d) less than 1/2 pack(s) per day; uses less than half 1/2 can(s) smokeless per week Number of flights of stairs before winded  1 Marital status  married Living situation  live with spouse Tobacco / smoke exposure  no Pain Contract  yes Illicit drug use  no Current work status  retired Children  3 Alcohol use  current drinker; drinks beer, wine and hard liquor; only occasionally per week Exercise  Exercises weekly; does other Drug/Alcohol Rehab (Previously)  no Drug/Alcohol  Rehab (Currently)  no Advance  Directives  Living Will, Healthcare POA  Medication History Ibuprofen (200MG  Tablet, Oral as needed) Active. Pantoprazole Sodium (40MG  Tablet DR, Oral) Active. Biotin (5000MCG Capsule, Oral) Active. Calcium Citrate (Oral) Specific strength unknown - Active. Fish Oil Burp-Less (1200MG  Capsule, Oral) Active. Multivitamins (Oral) Active. Robaxin (500MG  Tablet, Oral) Active. (prn) Vitamin C CR (500MG  Capsule ER, Oral) Active. Vitamin D (1000UNIT Tablet, Oral) Active. AmLODIPine Besylate Specific strength unknown - Active. Align (Oral) Active. Melatonin (10MG  Capsule, Oral) Active. Citalopram Hydrobromide (20MG  Tablet, Oral) Active. Quinine Sulfate (300MG  Capsule, Oral) Active. Finasteride (1MG  Tablet, Oral) Active. Aspirin EC (81MG  Tablet DR, Oral) Active.   Past Surgical History  Breast Biopsy  right, multiple times Cataract Surgery  bilateral Hip Fracture and Surgery  left Hysterectomy  complete (non-cancerous) Spinal Fusion  lower back Spinal Surgery  Total Hip Replacement  left Tubal Ligation  Placement of Neurostimulator   Review of Systems General Not Present- Chills, Fatigue, Fever, Memory Loss, Night Sweats, Weight Gain and Weight Loss. Skin Not Present- Eczema, Hives, Itching, Lesions and Rash. HEENT Not Present- Dentures, Double Vision, Headache, Hearing Loss, Tinnitus and Visual Loss. Respiratory Not Present- Allergies, Chronic Cough, Coughing up blood, Shortness of breath at rest and Shortness of breath with exertion. Cardiovascular Not Present- Chest Pain, Difficulty Breathing Lying Down, Murmur, Palpitations, Racing/skipping heartbeats and Swelling. Gastrointestinal Not Present- Abdominal Pain, Bloody Stool, Constipation, Diarrhea, Difficulty Swallowing, Heartburn, Jaundice, Loss of appetitie, Nausea and Vomiting. Female Genitourinary Not Present- Blood in Urine, Discharge, Flank Pain, Incontinence, Painful Urination, Urgency, Urinary  frequency, Urinary Retention, Urinating at Night and Weak urinary stream. Musculoskeletal Present- Joint Pain and Morning Stiffness. Not Present- Back Pain, Joint Swelling, Muscle Pain, Muscle Weakness and Spasms. Neurological Not Present- Blackout spells, Difficulty with balance, Dizziness, Paralysis, Tremor and Weakness. Psychiatric Not Present- Insomnia.  Vitals  Weight: 149 lb Height: 63in Weight was reported by patient. Height was reported by patient. Body Surface Area: 1.71 m Body Mass Index: 26.39 kg/m  Pulse: 64 (Regular)  Resp.: 12 (Unlabored)  BP: 128/70 (Sitting, Left Arm, Standard)       Physical Exam  General Mental Status -Alert, cooperative and good historian. General Appearance-pleasant, Not in acute distress. Orientation-Oriented X3. Build & Nutrition-Well nourished and Well developed.  Head and Neck Head-normocephalic, atraumatic . Neck Global Assessment - supple, no bruit auscultated on the right, no bruit auscultated on the left.  Eye Pupil - Bilateral-Regular and Round. Motion - Bilateral-EOMI.  Chest and Lung Exam Auscultation Breath sounds - clear at anterior chest wall and clear at posterior chest wall. Adventitious sounds - No Adventitious sounds.  Cardiovascular Auscultation Rhythm - Regular rate and rhythm. Heart Sounds - S1 WNL and S2 WNL. Murmurs & Other Heart Sounds - Auscultation of the heart reveals - No Murmurs.  Abdomen Palpation/Percussion Tenderness - Abdomen is non-tender to palpation. Rigidity (guarding) - Abdomen is soft. Auscultation Auscultation of the abdomen reveals - Bowel sounds normal.  Female Genitourinary Note: Not done, not pertinent to present illness   Musculoskeletal Note: She is alert and oriented, no apparent distress. Right hip can be flexed to 110, rotated in 10, out 20, abduct 20 without discomfort. Left hip flexed 120, rotated in 30, out 40, abduction 40 without discomfort. She  has a significantly antalgic gait pattern on the right.     Assessment & Plan Primary osteoarthritis of right hip (M16.11)  Note:Surgical Plans: Right Total Hip Replacement - Anterior Approach  Disposition: Home with husband  PCP: Dr.  Aronson  Topical TXA  Anesthesia Issues: Reaction to Succinycholine with a previous surgery  Patient was instructed on what medications to stop prior to surgery.  Signed electronically by Joelene Millin, III PA-C

## 2017-03-29 NOTE — Transfer of Care (Signed)
Immediate Anesthesia Transfer of Care Note  Patient: NAYLAH CORK  Procedure(s) Performed: Procedure(s): RIGHT TOTAL HIP ARTHROPLASTY ANTERIOR APPROACH (Right)  Patient Location: PACU  Anesthesia Type:General  Level of Consciousness: patient cooperative  Airway & Oxygen Therapy: Patient Spontanous Breathing and Patient connected to face mask oxygen  Post-op Assessment: Report given to RN and Post -op Vital signs reviewed and stable  Post vital signs: Reviewed and stable  Last Vitals:  Vitals:   03/29/17 1152  BP: 133/62  Pulse: 61  Resp: 18  Temp: 36.6 C    Last Pain:  Vitals:   03/29/17 1152  TempSrc: Oral         Complications: No apparent anesthesia complications

## 2017-03-29 NOTE — Anesthesia Preprocedure Evaluation (Signed)
Anesthesia Evaluation  Patient identified by MRN, date of birth, ID band Patient awake    Reviewed: Allergy & Precautions, NPO status , Patient's Chart, lab work & pertinent test results  Airway Mallampati: II  TM Distance: >3 FB Neck ROM: Full    Dental no notable dental hx.    Pulmonary sleep apnea , former smoker,    Pulmonary exam normal breath sounds clear to auscultation       Cardiovascular hypertension, Normal cardiovascular exam Rhythm:Regular Rate:Normal     Neuro/Psych Chronic narcotic use negative psych ROS   GI/Hepatic negative GI ROS, Neg liver ROS,   Endo/Other  negative endocrine ROS  Renal/GU negative Renal ROS  negative genitourinary   Musculoskeletal negative musculoskeletal ROS (+)   Abdominal   Peds negative pediatric ROS (+)  Hematology negative hematology ROS (+)   Anesthesia Other Findings   Reproductive/Obstetrics negative OB ROS                             Anesthesia Physical Anesthesia Plan  ASA: III  Anesthesia Plan: General   Post-op Pain Management:    Induction: Intravenous  Airway Management Planned: Oral ETT  Additional Equipment:   Intra-op Plan:   Post-operative Plan: Extubation in OR  Informed Consent: I have reviewed the patients History and Physical, chart, labs and discussed the procedure including the risks, benefits and alternatives for the proposed anesthesia with the patient or authorized representative who has indicated his/her understanding and acceptance.   Dental advisory given  Plan Discussed with: CRNA and Surgeon  Anesthesia Plan Comments:         Anesthesia Quick Evaluation

## 2017-03-29 NOTE — Anesthesia Procedure Notes (Signed)
Procedure Name: LMA Insertion Date/Time: 03/29/2017 1:25 PM Performed by: Dione Booze Pre-anesthesia Checklist: Emergency Drugs available, Suction available, Patient being monitored and Patient identified Patient Re-evaluated:Patient Re-evaluated prior to inductionOxygen Delivery Method: Circle system utilized Preoxygenation: Pre-oxygenation with 100% oxygen Intubation Type: IV induction LMA: LMA with gastric port inserted LMA Size: 3.0 Placement Confirmation: positive ETCO2 Tube secured with: Tape Dental Injury: Teeth and Oropharynx as per pre-operative assessment  Difficulty Due To: Difficulty was anticipated, Difficult Airway- due to reduced neck mobility and Difficult Airway- due to limited oral opening

## 2017-03-29 NOTE — Progress Notes (Signed)
Pt reports has a back neurostimulator that she has put it in surgery mode.-- she will turn it back on when able.

## 2017-03-30 ENCOUNTER — Encounter (HOSPITAL_COMMUNITY): Payer: Self-pay | Admitting: Orthopedic Surgery

## 2017-03-30 LAB — CBC
HCT: 32.6 % — ABNORMAL LOW (ref 36.0–46.0)
Hemoglobin: 10.7 g/dL — ABNORMAL LOW (ref 12.0–15.0)
MCH: 31.9 pg (ref 26.0–34.0)
MCHC: 32.8 g/dL (ref 30.0–36.0)
MCV: 97.3 fL (ref 78.0–100.0)
PLATELETS: 201 10*3/uL (ref 150–400)
RBC: 3.35 MIL/uL — AB (ref 3.87–5.11)
RDW: 13.3 % (ref 11.5–15.5)
WBC: 14.3 10*3/uL — ABNORMAL HIGH (ref 4.0–10.5)

## 2017-03-30 LAB — BASIC METABOLIC PANEL
Anion gap: 8 (ref 5–15)
BUN: 18 mg/dL (ref 6–20)
CALCIUM: 8.8 mg/dL — AB (ref 8.9–10.3)
CO2: 23 mmol/L (ref 22–32)
Chloride: 107 mmol/L (ref 101–111)
Creatinine, Ser: 0.9 mg/dL (ref 0.44–1.00)
GFR calc Af Amer: >60 mL/min (ref >60)
GFR, EST NON AFRICAN AMERICAN: 59 mL/min — AB (ref >60)
GLUCOSE: 129 mg/dL — AB (ref 65–99)
POTASSIUM: 4.5 mmol/L (ref 3.5–5.1)
SODIUM: 138 mmol/L (ref 135–145)

## 2017-03-30 MED ORDER — SODIUM CHLORIDE 0.9 % IV BOLUS (SEPSIS)
500.0000 mL | Freq: Once | INTRAVENOUS | Status: AC
  Administered 2017-03-30: 500 mL via INTRAVENOUS

## 2017-03-30 MED ORDER — PANTOPRAZOLE SODIUM 40 MG PO TBEC
40.0000 mg | DELAYED_RELEASE_TABLET | Freq: Every day | ORAL | Status: DC
  Administered 2017-03-30 – 2017-03-31 (×2): 40 mg via ORAL
  Filled 2017-03-30 (×2): qty 1

## 2017-03-30 MED ORDER — TEMAZEPAM 15 MG PO CAPS
15.0000 mg | ORAL_CAPSULE | Freq: Every evening | ORAL | Status: DC | PRN
  Administered 2017-03-30: 23:00:00 15 mg via ORAL
  Filled 2017-03-30: qty 1

## 2017-03-30 NOTE — Anesthesia Postprocedure Evaluation (Signed)
Anesthesia Post Note  Patient: Stacy Parker  Procedure(s) Performed: Procedure(s) (LRB): RIGHT TOTAL HIP ARTHROPLASTY ANTERIOR APPROACH (Right)  Patient location during evaluation: PACU Anesthesia Type: General Level of consciousness: awake and alert Pain management: pain level controlled Vital Signs Assessment: post-procedure vital signs reviewed and stable Respiratory status: spontaneous breathing, nonlabored ventilation, respiratory function stable and patient connected to nasal cannula oxygen Cardiovascular status: blood pressure returned to baseline and stable Postop Assessment: no signs of nausea or vomiting Anesthetic complications: no       Last Vitals:  Vitals:   03/30/17 0125 03/30/17 0618  BP: 130/68 131/63  Pulse: 73 71  Resp: 14 14  Temp: 36.9 C 36.7 C    Last Pain:  Vitals:   03/30/17 0710  TempSrc:   PainSc: 2                  Kamsiyochukwu Buist S

## 2017-03-30 NOTE — Progress Notes (Signed)
   Subjective: 1 Day Post-Op Procedure(s) (LRB): RIGHT TOTAL HIP ARTHROPLASTY ANTERIOR APPROACH (Right) Patient reports pain as mild.   We will start therapy today.  Plan is to go Home after hospital stay.  Objective: Vital signs in last 24 hours: Temp:  [97.6 F (36.4 C)-98.5 F (36.9 C)] 98.1 F (36.7 C) (05/24 0618) Pulse Rate:  [61-92] 71 (05/24 0618) Resp:  [10-18] 14 (05/24 0618) BP: (117-148)/(58-79) 131/63 (05/24 0618) SpO2:  [92 %-99 %] 98 % (05/24 0618) FiO2 (%):  [2 %] 2 % (05/23 1900) Weight:  [68 kg (150 lb)] 68 kg (150 lb) (05/23 1700)  Intake/Output from previous day:  Intake/Output Summary (Last 24 hours) at 03/30/17 0715 Last data filed at 03/30/17 0710  Gross per 24 hour  Intake          4148.75 ml  Output             2165 ml  Net          1983.75 ml    Intake/Output this shift: Total I/O In: 500 [IV Piggyback:500] Out: -   Labs:  Recent Labs  03/30/17 0407  HGB 10.7*    Recent Labs  03/30/17 0407  WBC 14.3*  RBC 3.35*  HCT 32.6*  PLT 201    Recent Labs  03/30/17 0407  NA 138  K 4.5  CL 107  CO2 23  BUN 18  CREATININE 0.90  GLUCOSE 129*  CALCIUM 8.8*   No results for input(s): LABPT, INR in the last 72 hours.  EXAM General - Patient is Alert, Appropriate and Oriented Extremity - Neurologically intact Neurovascular intact No cellulitis present Compartment soft Dressing - dressing C/D/I Motor Function - intact, moving foot and toes well on exam.  Hemovac pulled without difficulty.  Past Medical History:  Diagnosis Date  . Anxiety   . Arthritis   . Basal cell carcinoma   . Cervical arthritis ([place])    PT STATES CERVICAL SPINE BADLY DEGENERATED--SHE DOES NOT HAVE NECK PAIN -BUT TOLD HER NECK IS FRAGILE  . Complication of anesthesia    myalgia   . Diverticulosis   . DVT (deep venous thrombosis) ([facility])    Left leg  . GERD (gastroesophageal reflux disease)   . H/O hiatal hernia    repairsed  . HTN (hypertension)   .  Neuropathy of foot, left   . Osteopenia   . Osteoporosis   . Reflux   . Sciatic nerve disease   . Sciatic pain    RIGHT LEG / BACK PAIN- PT ON OXYCONTIN 3 TIMES A DAY--STATES MUST CONTINUE WHILE IN HOSPITAL TO AVOID WITHDRAWAL  . Sleep apnea 04/12/2012   cpap  . Squamous carcinoma     Assessment/Plan: 1 Day Post-Op Procedure(s) (LRB): RIGHT TOTAL HIP ARTHROPLASTY ANTERIOR APPROACH (Right) Principal Problem:   OA (osteoarthritis) of hip   Advance diet Up with therapy D/C IV fluids Plan for discharge tomorrow  DVT Prophylaxis - Xarelto Weight Bearing As Tolerated right Leg Hemovac Pulled Begin Therapy  Gearlean Alf

## 2017-03-30 NOTE — Discharge Instructions (Signed)
Dr. Gaynelle Arabian Total Joint Specialist [facility] [address], [place], [place] [address] [phone]  ANTERIOR APPROACH TOTAL HIP REPLACEMENT POSTOPERATIVE DIRECTIONS   Hip Rehabilitation, Guidelines Following Surgery  The results of a hip operation are greatly improved after range of motion and muscle strengthening exercises. Follow all safety measures which are given to protect your hip. If any of these exercises cause increased pain or swelling in your joint, decrease the amount until you are comfortable again. Then slowly increase the exercises. Call your caregiver if you have problems or questions.   HOME CARE INSTRUCTIONS  Remove items at home which could result in a fall. This includes throw rugs or furniture in walking pathways.   ICE to the affected hip every three hours for 30 minutes at a time and then as needed for pain and swelling.  Continue to use ice on the hip for pain and swelling from surgery. You may notice swelling that will progress down to the foot and ankle.  This is normal after surgery.  Elevate the leg when you are not up walking on it.    Continue to use the breathing machine which will help keep your temperature down.  It is common for your temperature to cycle up and down following surgery, especially at night when you are not up moving around and exerting yourself.  The breathing machine keeps your lungs expanded and your temperature down.   DIET You may resume your previous home diet once your are discharged from the hospital.  DRESSING / WOUND CARE / SHOWERING You may start showering once you are discharged home but do not submerge the incision under water. Just pat the incision dry and apply a dry gauze dressing on daily. Change the surgical dressing daily and reapply a dry dressing each time.  ACTIVITY Walk with your walker as instructed. Use walker as long as suggested by your caregivers. Avoid periods of inactivity  such as sitting longer than an hour when not asleep. This helps prevent blood clots.  You may resume a sexual relationship in one month or when given the OK by your doctor.  You may return to work once you are cleared by your doctor.  Do not drive a car for 6 weeks or until released by you surgeon.  Do not drive while taking narcotics.  WEIGHT BEARING Weight bearing as tolerated with assist device (walker, cane, etc) as directed, use it as long as suggested by your surgeon or therapist, typically at least 4-6 weeks.  POSTOPERATIVE CONSTIPATION PROTOCOL Constipation - defined medically as fewer than three stools per week and severe constipation as less than one stool per week.  One of the most common issues patients have following surgery is constipation.  Even if you have a regular bowel pattern at home, your normal regimen is likely to be disrupted due to multiple reasons following surgery.  Combination of anesthesia, postoperative narcotics, change in appetite and fluid intake all can affect your bowels.  In order to avoid complications following surgery, here are some recommendations in order to help you during your recovery period.  Colace (docusate) - Pick up an over-the-counter form of Colace or another stool softener and take twice a day as long as you are requiring postoperative pain medications.  Take with a full glass of water daily.  If you experience loose stools or diarrhea, hold the colace until you stool forms back up.  If your symptoms do not get better within 1  week or if they get worse, check with your doctor. ° °Dulcolax (bisacodyl) - Pick up over-the-counter and take as directed by the product packaging as needed to assist with the movement of your bowels.  Take with a full glass of water.  Use this product as needed if not relieved by Colace only.  ° °MiraLax (polyethylene glycol) - Pick up over-the-counter to have on hand.  MiraLax is a solution that will increase the amount of  water in your bowels to assist with bowel movements.  Take as directed and can mix with a glass of water, juice, soda, coffee, or tea.  Take if you go more than two days without a movement. °Do not use MiraLax more than once per day. Call your doctor if you are still constipated or irregular after using this medication for 7 days in a row. ° °If you continue to have problems with postoperative constipation, please contact the office for further assistance and recommendations.  If you experience "the worst abdominal pain ever" or develop nausea or vomiting, please contact the office immediatly for further recommendations for treatment. ° °ITCHING ° If you experience itching with your medications, try taking only a single pain pill, or even half a pain pill at a time.  You can also use Benadryl over the counter for itching or also to help with sleep.  ° °TED HOSE STOCKINGS °Wear the elastic stockings on both legs for three weeks following surgery during the day but you may remove then at night for sleeping. ° °MEDICATIONS °See your medication summary on the “After Visit Summary” that the nursing staff will review with you prior to discharge.  You may have some home medications which will be placed on hold until you complete the course of blood thinner medication.  It is important for you to complete the blood thinner medication as prescribed by your surgeon.  Continue your approved medications as instructed at time of discharge. ° °PRECAUTIONS °If you experience chest pain or shortness of breath - call 911 immediately for transfer to the hospital emergency department.  °If you develop a fever greater that 101 F, purulent drainage from wound, increased redness or drainage from wound, foul odor from the wound/dressing, or calf pain - CONTACT YOUR SURGEON.   °                                                °FOLLOW-UP APPOINTMENTS °Make sure you keep all of your appointments after your operation with your surgeon and  caregivers. You should call the office at the above phone number and make an appointment for approximately two weeks after the date of your surgery or on the date instructed by your surgeon outlined in the "After Visit Summary". ° °RANGE OF MOTION AND STRENGTHENING EXERCISES  °These exercises are designed to help you keep full movement of your hip joint. Follow your caregiver's or physical therapist's instructions. Perform all exercises about fifteen times, three times per day or as directed. Exercise both hips, even if you have had only one joint replacement. These exercises can be done on a training (exercise) mat, on the floor, on a table or on a bed. Use whatever works the best and is most comfortable for you. Use music or television while you are exercising so that the exercises are a pleasant break in your day. This   will make your life better with the exercises acting as a break in routine you can look forward to.  Lying on your back, slowly slide your foot toward your buttocks, raising your knee up off the floor. Then slowly slide your foot back down until your leg is straight again.  Lying on your back spread your legs as far apart as you can without causing discomfort.  Lying on your side, raise your upper leg and foot straight up from the floor as far as is comfortable. Slowly lower the leg and repeat.  Lying on your back, tighten up the muscle in the front of your thigh (quadriceps muscles). You can do this by keeping your leg straight and trying to raise your heel off the floor. This helps strengthen the largest muscle supporting your knee.  Lying on your back, tighten up the muscles of your buttocks both with the legs straight and with the knee bent at a comfortable angle while keeping your heel on the floor.   IF YOU ARE TRANSFERRED TO A SKILLED REHAB FACILITY If the patient is transferred to a skilled rehab facility following release from the hospital, a list of the current medications will be  sent to the facility for the patient to continue.  When discharged from the skilled rehab facility, please have the facility set up the patient's [place] prior to being released. Also, the skilled facility will be responsible for providing the patient with their medications at time of release from the facility to include their pain medication, the muscle relaxants, and their blood thinner medication. If the patient is still at the rehab facility at time of the two week follow up appointment, the skilled rehab facility will also need to assist the patient in arranging follow up appointment in our office and any transportation needs.  MAKE SURE YOU:  Understand these instructions.  Get help right away if you are not doing well or get worse.    Pick up stool softner and laxative for home use following surgery while on pain medications. Do not submerge incision under water. Please use good hand washing techniques while changing dressing each day. May shower starting three days after surgery. Please use a clean towel to pat the incision dry following showers. Continue to use ice for pain and swelling after surgery. Do not use any lotions or creams on the incision until instructed by your surgeon.  ====================================================================================================  Information on my medicine - XARELTO (Rivaroxaban)  Why was Xarelto prescribed for you? Xarelto was prescribed for you to reduce the risk of blood clots forming after orthopedic surgery. The medical term for these abnormal blood clots is venous thromboembolism (VTE).  What do you need to know about xarelto ? Take your Xarelto ONCE DAILY at the same time every day. You may take it either with or without food.  If you have difficulty swallowing the tablet whole, you may crush it and mix in applesauce just prior to taking your dose.  Take Xarelto exactly as prescribed by your  doctor and DO NOT stop taking Xarelto without talking to the doctor who prescribed the medication.  Stopping without other VTE prevention medication to take the place of Xarelto may increase your risk of developing a clot.  After discharge, you should have regular check-up appointments with your healthcare provider that is prescribing your Xarelto.    What do you do if you miss a dose? If you miss a dose, take it as soon as  you remember on the same day then continue your regularly scheduled once daily regimen the next day. Do not take two doses of Xarelto on the same day.   Important Safety Information A possible side effect of Xarelto is bleeding. You should call your healthcare provider right away if you experience any of the following: ? Bleeding from an injury or your nose that does not stop. ? Unusual colored urine (red or dark brown) or unusual colored stools (red or black). ? Unusual bruising for unknown reasons. ? A serious fall or if you hit your head (even if there is no bleeding).  Some medicines may interact with Xarelto and might increase your risk of bleeding while on Xarelto. To help avoid this, consult your healthcare provider or pharmacist prior to using any new prescription or non-prescription medications, including herbals, vitamins, non-steroidal anti-inflammatory drugs (NSAIDs) and supplements.  This website has more information on Xarelto: [email or web address]

## 2017-03-30 NOTE — Evaluation (Signed)
Physical Therapy Evaluation Patient Details Name: Stacy Parker MRN: [id number] DOB: 16-Oct-1934 Today's Date: 03/30/2017   History of Present Illness  Pt s/p R THR and with hx of 8 level spinal fusion and spinal neurostimulator in place  Clinical Impression  Pt s/p R THR and presents with decreased R LE strength/ROM and post op pain limiting functional mobility.     Follow Up Recommendations DC plan and follow up therapy as arranged by surgeon    Equipment Recommendations  None recommended by PT    Recommendations for Other Services       Precautions / Restrictions Precautions Precautions: Fall Restrictions Weight Bearing Restrictions: No Other Position/Activity Restrictions: WBAT      Mobility  Bed Mobility Overal bed mobility: Needs Assistance Bed Mobility: Supine to Sit     Supine to sit: Min assist     General bed mobility comments: cues for sequence and use of L LE to self assist.    Transfers Overall transfer level: Needs assistance   Transfers: Sit to/from Stand Sit to Stand: Min assist         General transfer comment: cues for LE management and use of UEs to self assist  Ambulation/Gait Ambulation/Gait assistance: Min assist Ambulation Distance (Feet): 38 Feet Assistive device: Rolling walker (2 wheeled) Gait Pattern/deviations: Step-to pattern;Step-through pattern;Decreased step length - right;Decreased step length - left;Shuffle;Trunk flexed Gait velocity: decr Gait velocity interpretation: Below normal speed for age/gender General Gait Details: cues for posture, position from RW and initial sequence  Stairs            Wheelchair Mobility    Modified Rankin (Stroke Patients Only)       Balance                                             Pertinent Vitals/Pain Pain Assessment: 0-10 Pain Score: 5  Pain Location: R hip/thigh Pain Descriptors / Indicators: Aching;Sore Pain Intervention(s): Limited  activity within patient's tolerance;Monitored during session;Premedicated before session;Ice applied    Home Living Family/patient expects to be discharged to:: Skilled nursing facility Living Arrangements: Spouse/significant other               Additional Comments: Pt reside at [facility]    Prior Function Level of Independence: Independent               Hand Dominance        Extremity/Trunk Assessment   Upper Extremity Assessment Upper Extremity Assessment: Overall WFL for tasks assessed    Lower Extremity Assessment Lower Extremity Assessment: RLE deficits/detail RLE Deficits / Details: Strength at hip 2/5 with AAROM at hip to 90 flex and 15 abd       Communication   Communication: No difficulties  Cognition Arousal/Alertness: Awake/alert Behavior During Therapy: WFL for tasks assessed/performed Overall Cognitive Status: Within Functional Limits for tasks assessed                                        General Comments      Exercises Total Joint Exercises Ankle Circles/Pumps: AROM;20 reps;Supine;Both Quad Sets: AROM;Both;10 reps;Supine Heel Slides: AAROM;Right;20 reps;Supine Hip ABduction/ADduction: AAROM;Right;15 reps;Supine   Assessment/Plan    PT Assessment Patient needs continued PT services  PT Problem List Decreased strength;Decreased range of motion;Decreased  activity tolerance;Decreased mobility;Pain;Decreased knowledge of use of DME       PT Treatment Interventions DME instruction;Gait training;Functional mobility training;Therapeutic activities;Therapeutic exercise;Patient/family education    PT Goals (Current goals can be found in the Care Plan section)  Acute Rehab PT Goals Patient Stated Goal: Regain IND PT Goal Formulation: With patient Time For Goal Achievement: 04/02/17 Potential to Achieve Goals: Good    Frequency 7X/week   Barriers to discharge        Co-evaluation               AM-PAC PT  "6 Clicks" Daily Activity  Outcome Measure Difficulty turning over in bed (including adjusting bedclothes, sheets and blankets)?: A Little Difficulty moving from lying on back to sitting on the side of the bed? : A Little Difficulty sitting down on and standing up from a chair with arms (e.g., wheelchair, bedside commode, etc,.)?: A Little Help needed moving to and from a bed to chair (including a wheelchair)?: A Little Help needed walking in hospital room?: A Little Help needed climbing 3-5 steps with a railing? : A Little 6 Click Score: 18    End of Session Equipment Utilized During Treatment: Gait belt Activity Tolerance: Patient tolerated treatment well Patient left: in chair;with call bell/phone within reach;with family/visitor present Nurse Communication: Mobility status PT Visit Diagnosis: Unsteadiness on feet (R26.81);Difficulty in walking, not elsewhere classified (R26.2)    Time: 9024-0973 PT Time Calculation (min) (ACUTE ONLY): 40 min   Charges:   PT Evaluation $PT Eval Low Complexity: 1 Procedure PT Treatments $Gait Training: 8-22 mins $Therapeutic Exercise: 8-22 mins   PT G Codes:        Pg [phone]   Manahil Vanzile 03/30/2017, 3:46 PM

## 2017-03-30 NOTE — Progress Notes (Signed)
Physical Therapy Treatment Patient Details Name: Stacy Parker MRN: [id number] DOB: 1934/04/12 Today's Date: 03/30/2017    History of Present Illness Pt s/p R THR and with hx of 8 level spinal fusion and spinal neurostimulator in place    PT Comments    Pt motivated and progressing steadily with mobility.   Follow Up Recommendations  DC plan and follow up therapy as arranged by surgeon     Equipment Recommendations  None recommended by PT    Recommendations for Other Services       Precautions / Restrictions Precautions Precautions: Fall Restrictions Weight Bearing Restrictions: No Other Position/Activity Restrictions: WBAT    Mobility  Bed Mobility Overal bed mobility: Needs Assistance Bed Mobility: Supine to Sit;Sit to Supine     Supine to sit: Min assist Sit to supine: Min assist   General bed mobility comments: cues for sequence and use of L LE to self assist.    Transfers Overall transfer level: Needs assistance Equipment used: Rolling walker (2 wheeled) Transfers: Sit to/from Stand Sit to Stand: Min guard         General transfer comment: cues for LE management and use of UEs to self assist  Ambulation/Gait Ambulation/Gait assistance: Min assist Ambulation Distance (Feet): 100 Feet (and 20' back from bathroom) Assistive device: Rolling walker (2 wheeled) Gait Pattern/deviations: Step-to pattern;Step-through pattern;Decreased step length - right;Decreased step length - left;Shuffle;Trunk flexed Gait velocity: decr Gait velocity interpretation: Below normal speed for age/gender General Gait Details: cues for posture, position from RW and initial sequence   Stairs            Wheelchair Mobility    Modified Rankin (Stroke Patients Only)       Balance                                            Cognition Arousal/Alertness: Awake/alert Behavior During Therapy: WFL for tasks assessed/performed Overall Cognitive  Status: Within Functional Limits for tasks assessed                                        Exercises Total Joint Exercises Ankle Circles/Pumps: AROM;20 reps;Supine;Both Quad Sets: AROM;Both;10 reps;Supine Heel Slides: AAROM;Right;20 reps;Supine Hip ABduction/ADduction: AAROM;Right;15 reps;Supine    General Comments        Pertinent Vitals/Pain Pain Assessment: 0-10 Pain Score: 5  Pain Location: R hip/thigh Pain Descriptors / Indicators: Aching;Sore Pain Intervention(s): Limited activity within patient's tolerance;Monitored during session;Premedicated before session;Ice applied    Home Living Family/patient expects to be discharged to:: Skilled nursing facility Living Arrangements: Spouse/significant other             Additional Comments: Pt reside at [facility]    Prior Function Level of Independence: Independent          PT Goals (current goals can now be found in the care plan section) Acute Rehab PT Goals Patient Stated Goal: Regain IND PT Goal Formulation: With patient Time For Goal Achievement: 04/02/17 Potential to Achieve Goals: Good Progress towards PT goals: Progressing toward goals    Frequency    7X/week      PT Plan Current plan remains appropriate    Co-evaluation              AM-PAC PT "6 Clicks" Daily  Activity  Outcome Measure  Difficulty turning over in bed (including adjusting bedclothes, sheets and blankets)?: A Little Difficulty moving from lying on back to sitting on the side of the bed? : A Little Difficulty sitting down on and standing up from a chair with arms (e.g., wheelchair, bedside commode, etc,.)?: A Little Help needed moving to and from a bed to chair (including a wheelchair)?: A Little Help needed walking in hospital room?: A Little Help needed climbing 3-5 steps with a railing? : A Little 6 Click Score: 18    End of Session Equipment Utilized During Treatment: Gait belt Activity Tolerance:  Patient tolerated treatment well Patient left: in bed;with call bell/phone within reach;with family/visitor present Nurse Communication: Mobility status PT Visit Diagnosis: Unsteadiness on feet (R26.81);Difficulty in walking, not elsewhere classified (R26.2)     Time: 1027-2536 PT Time Calculation (min) (ACUTE ONLY): 39 min  Charges:  $Gait Training: 8-22 mins $Therapeutic Exercise: 8-22 mins $Therapeutic Activity: 8-22 mins                    G Codes:       Pg [phone]    Henderson Frampton 03/30/2017, 3:55 PM

## 2017-03-31 DIAGNOSIS — Z471 Aftercare following joint replacement surgery: Secondary | ICD-10-CM | POA: Diagnosis not present

## 2017-03-31 DIAGNOSIS — I10 Essential (primary) hypertension: Secondary | ICD-10-CM | POA: Diagnosis not present

## 2017-03-31 DIAGNOSIS — Z96641 Presence of right artificial hip joint: Secondary | ICD-10-CM | POA: Diagnosis not present

## 2017-03-31 DIAGNOSIS — M1611 Unilateral primary osteoarthritis, right hip: Secondary | ICD-10-CM | POA: Diagnosis not present

## 2017-03-31 DIAGNOSIS — R262 Difficulty in walking, not elsewhere classified: Secondary | ICD-10-CM | POA: Diagnosis not present

## 2017-03-31 DIAGNOSIS — Z86718 Personal history of other venous thrombosis and embolism: Secondary | ICD-10-CM | POA: Diagnosis not present

## 2017-03-31 DIAGNOSIS — M6281 Muscle weakness (generalized): Secondary | ICD-10-CM | POA: Diagnosis not present

## 2017-03-31 DIAGNOSIS — R2681 Unsteadiness on feet: Secondary | ICD-10-CM | POA: Diagnosis not present

## 2017-03-31 DIAGNOSIS — F339 Major depressive disorder, recurrent, unspecified: Secondary | ICD-10-CM | POA: Diagnosis not present

## 2017-03-31 DIAGNOSIS — M1990 Unspecified osteoarthritis, unspecified site: Secondary | ICD-10-CM | POA: Diagnosis not present

## 2017-03-31 LAB — CBC
HCT: 31.4 % — ABNORMAL LOW (ref 36.0–46.0)
HEMOGLOBIN: 10.4 g/dL — AB (ref 12.0–15.0)
MCH: 32.2 pg (ref 26.0–34.0)
MCHC: 33.1 g/dL (ref 30.0–36.0)
MCV: 97.2 fL (ref 78.0–100.0)
Platelets: 188 10*3/uL (ref 150–400)
RBC: 3.23 MIL/uL — AB (ref 3.87–5.11)
RDW: 13.2 % (ref 11.5–15.5)
WBC: 16.5 10*3/uL — ABNORMAL HIGH (ref 4.0–10.5)

## 2017-03-31 LAB — BASIC METABOLIC PANEL
Anion gap: 6 (ref 5–15)
BUN: 15 mg/dL (ref 6–20)
CO2: 28 mmol/L (ref 22–32)
Calcium: 9.1 mg/dL (ref 8.9–10.3)
Chloride: 105 mmol/L (ref 101–111)
Creatinine, Ser: 0.94 mg/dL (ref 0.44–1.00)
GFR calc Af Amer: >60 mL/min (ref >60)
GFR calc non Af Amer: 56 mL/min — ABNORMAL LOW (ref >60)
Glucose, Bld: 112 mg/dL — ABNORMAL HIGH (ref 65–99)
POTASSIUM: 4.5 mmol/L (ref 3.5–5.1)
SODIUM: 139 mmol/L (ref 135–145)

## 2017-03-31 MED ORDER — RIVAROXABAN 10 MG PO TABS: 10.0000 mg | ORAL_TABLET | Freq: Every day | ORAL | 0 refills | Status: DC

## 2017-03-31 MED ORDER — OXYCODONE HCL 5 MG PO TABS: 5.0000 mg | ORAL_TABLET | ORAL | 0 refills | Status: DC | PRN

## 2017-03-31 NOTE — Progress Notes (Signed)
   Subjective: 2 Days Post-Op Procedure(s) (LRB): RIGHT TOTAL HIP ARTHROPLASTY ANTERIOR APPROACH (Right) Patient reports pain as moderate.   Plan is to go Home after hospital stay to [facility] rehab  Objective: Vital signs in last 24 hours: Temp:  [98.3 F (36.8 C)-99.2 F (37.3 C)] 99.2 F (37.3 C) (05/25 0718) Pulse Rate:  [73-80] 75 (05/25 0718) Resp:  [14-16] 16 (05/25 0718) BP: (128-132)/(50-62) 131/55 (05/25 0718) SpO2:  [96 %-98 %] 96 % (05/25 0718)  Intake/Output from previous day:  Intake/Output Summary (Last 24 hours) at 03/31/17 0752 Last data filed at 03/31/17 0600  Gross per 24 hour  Intake             1505 ml  Output             2600 ml  Net            -1095 ml    Intake/Output this shift: No intake/output data recorded.  Labs:  Recent Labs  03/30/17 0407 03/31/17 0414  HGB 10.7* 10.4*    Recent Labs  03/30/17 0407 03/31/17 0414  WBC 14.3* 16.5*  RBC 3.35* 3.23*  HCT 32.6* 31.4*  PLT 201 188    Recent Labs  03/30/17 0407 03/31/17 0414  NA 138 139  K 4.5 4.5  CL 107 105  CO2 23 28  BUN 18 15  CREATININE 0.90 0.94  GLUCOSE 129* 112*  CALCIUM 8.8* 9.1   No results for input(s): LABPT, INR in the last 72 hours.  EXAM General - Patient is Alert, Appropriate and Oriented Extremity - Neurologically intact Neurovascular intact Incision: dressing C/D/I No cellulitis present Dressing/Incision - clean, dry, no drainage Motor Function - intact, moving foot and toes well on exam.   Past Medical History:  Diagnosis Date  . Anxiety   . Arthritis   . Basal cell carcinoma   . Cervical arthritis ([place])    PT STATES CERVICAL SPINE BADLY DEGENERATED--SHE DOES NOT HAVE NECK PAIN -BUT TOLD HER NECK IS FRAGILE  . Complication of anesthesia    myalgia   . Diverticulosis   . DVT (deep venous thrombosis) ([facility])    Left leg  . GERD (gastroesophageal reflux disease)   . H/O hiatal hernia    repairsed  . HTN (hypertension)   . Neuropathy of  foot, left   . Osteopenia   . Osteoporosis   . Reflux   . Sciatic nerve disease   . Sciatic pain    RIGHT LEG / BACK PAIN- PT ON OXYCONTIN 3 TIMES A DAY--STATES MUST CONTINUE WHILE IN HOSPITAL TO AVOID WITHDRAWAL  . Sleep apnea 04/12/2012   cpap  . Squamous carcinoma     Assessment/Plan: 2 Days Post-Op Procedure(s) (LRB): RIGHT TOTAL HIP ARTHROPLASTY ANTERIOR APPROACH (Right) Principal Problem:   OA (osteoarthritis) of hip   Discharge home with home health  DVT Prophylaxis - Xarelto Weight Bearing As Tolerated right Leg  Stacy Parker V 03/31/2017, 7:52 AM

## 2017-03-31 NOTE — Progress Notes (Addendum)
CSW consulted today to assist with ST Rehab. Pt admitted on 03/29/17 for pre planned R THR ( medicare bundle program ) from [facility]. Plan was for pt to return to prior living environment but pt has decided she needs ST Rehab placement. MD aware and in agreement with change of plan. [facility] contacted and is able to accept pt today. CSW will send dc info to SNF once it is available.   Werner Lean LCSW [phone]  14:34 Pt is ready to dc to SNF at [facility]. FL2 signed and D?C Summary sent to SNF for review. Pasrr completed. Scripts included in d/c packet. # for report provided to nsg. Pt requesting to transport by car. D/C packet provided to pt.  Werner Lean LCSW [phone]

## 2017-03-31 NOTE — Progress Notes (Signed)
Plan for d/c to SNF, discharge planning per CSW. [phone] 

## 2017-03-31 NOTE — NC FL2 (Signed)
[place] LEVEL OF CARE SCREENING TOOL     IDENTIFICATION  Patient Name: Stacy Parker Birthdate: 1934/09/14 Sex: female Admission Date (Current Location): 03/29/2017  [facility] and [place] Number:  Herbalist and Address:  [facility],  [place] [address] [place]      Provider Number: [id number]  Attending Physician Name and Address:  Gaynelle Arabian, MD  Relative Name and Phone Number:       Current Level of Care: Hospital Recommended Level of Care: [place] Prior Approval Number:    Date Approved/Denied:   PASRR Number:    Discharge Plan: SNF    Current Diagnoses: Patient Active Problem List   Diagnosis Date Noted  . OA (osteoarthritis) of hip 03/29/2017  . Obstructive sleep apnea (adult) (pediatric) 04/18/2014  . Chronic LBP 09/08/2012  . Failed back syndrome of lumbar spine 09/08/2012  . Breast hematoma after core biopsy at [facility]  07/05/2012  . Hiatal hernia repair with gastroplasty June 2013 05/04/2012  . PMB (postmenopausal bleeding)   . Osteopenia   . PERSONAL HX COLONIC POLYPS 11/25/2008  . COLONIC POLYPS, ADENOMATOUS 11/24/2008  . ESOPHAGEAL STRICTURE 11/24/2008  . GERD 11/24/2008  . HIATAL HERNIA 11/24/2008  . DIVERTICULOSIS, COLON 11/24/2008    Orientation RESPIRATION BLADDER Height & Weight     Self, Time, Situation, Place  Normal Continent Weight: 150 lb (68 kg) Height:  5\' 2"  (157.5 cm)  BEHAVIORAL SYMPTOMS/MOOD NEUROLOGICAL BOWEL NUTRITION STATUS  Other (Comment) (no behaviors)   Continent Diet  AMBULATORY STATUS COMMUNICATION OF NEEDS Skin   Limited Assist Verbally Surgical wounds                       Personal Care Assistance Level of Assistance  Bathing, Feeding, Dressing Bathing Assistance: Limited assistance Feeding assistance: Independent Dressing Assistance: Limited assistance     Functional Limitations Info  Sight, Hearing, Speech Sight Info:  Adequate Hearing Info: Adequate Speech Info: Adequate    SPECIAL CARE FACTORS FREQUENCY  PT (By licensed PT), OT (By licensed OT)     PT Frequency: 5x wk OT Frequency: 5x wk            Contractures Contractures Info: Not present    Additional Factors Info  Code Status Code Status Info: Full Code             Current Medications (03/31/2017):  This is the current hospital active medication list Current Facility-Administered Medications  Medication Dose Route Frequency Provider Last Rate Last Dose  . 0.9 %  sodium chloride infusion   Intravenous Continuous Gaynelle Arabian, MD 75 mL/hr at 03/30/17 0610    . acetaminophen (TYLENOL) tablet 650 mg  650 mg Oral Q6H PRN Gaynelle Arabian, MD       Or  . acetaminophen (TYLENOL) suppository 650 mg  650 mg Rectal Q6H PRN Treyven Lafauci, Pilar Plate, MD      . amLODipine (NORVASC) tablet 5 mg  5 mg Oral QPM Gaynelle Arabian, MD   5 mg at 03/30/17 1757  . bisacodyl (DULCOLAX) suppository 10 mg  10 mg Rectal Daily PRN Jeury Mcnab, Pilar Plate, MD      . citalopram (CELEXA) tablet 40 mg  40 mg Oral Daily Gaynelle Arabian, MD   40 mg at 03/31/17 1059  . diphenhydrAMINE (BENADRYL) 12.5 MG/5ML elixir 12.5-25 mg  12.5-25 mg Oral Q4H PRN Ceara Wrightson, Pilar Plate, MD      . docusate sodium (COLACE) capsule 100 mg  100 mg  Oral BID Gaynelle Arabian, MD   100 mg at 03/31/17 1059  . finasteride (PROSCAR) tablet 2.5 mg  2.5 mg Oral QHS Gaynelle Arabian, MD   2.5 mg at 03/30/17 2111  . menthol-cetylpyridinium (CEPACOL) lozenge 3 mg  1 lozenge Oral PRN Gaynelle Arabian, MD       Or  . phenol (CHLORASEPTIC) mouth spray 1 spray  1 spray Mouth/Throat PRN Gilma Bessette, Pilar Plate, MD      . methocarbamol (ROBAXIN) tablet 500 mg  500 mg Oral Q6H PRN Gaynelle Arabian, MD   500 mg at 03/31/17 4076   Or  . methocarbamol (ROBAXIN) 500 mg in dextrose 5 % 50 mL IVPB  500 mg Intravenous Q6H PRN Dameisha Tschida, Pilar Plate, MD      . metoCLOPramide (REGLAN) tablet 5-10 mg  5-10 mg Oral Q8H PRN Gaynelle Arabian, MD       Or  .  metoCLOPramide (REGLAN) injection 5-10 mg  5-10 mg Intravenous Q8H PRN Laporshia Hogen, Pilar Plate, MD      . morphine 4 MG/ML injection 1 mg  1 mg Intravenous Q2H PRN Avaleen Brownley, Pilar Plate, MD      . ondansetron (ZOFRAN) tablet 4 mg  4 mg Oral Q6H PRN Gaynelle Arabian, MD       Or  . ondansetron (ZOFRAN) injection 4 mg  4 mg Intravenous Q6H PRN Kurstin Dimarzo, Pilar Plate, MD      . oxyCODONE (Oxy IR/ROXICODONE) immediate release tablet 5-10 mg  5-10 mg Oral Q3H PRN Gaynelle Arabian, MD   10 mg at 03/31/17 8088  . pantoprazole (PROTONIX) EC tablet 40 mg  40 mg Oral QAC breakfast Gaynelle Arabian, MD   40 mg at 03/31/17 1103  . polyethylene glycol (MIRALAX / GLYCOLAX) packet 17 g  17 g Oral Daily PRN Kecia Swoboda, Pilar Plate, MD      . rivaroxaban Alveda Reasons) tablet 10 mg  10 mg Oral Q breakfast Gaynelle Arabian, MD   10 mg at 03/31/17 1594  . sodium phosphate (FLEET) 7-19 GM/118ML enema 1 enema  1 enema Rectal Once PRN Breylen Agyeman, Pilar Plate, MD      . temazepam (RESTORIL) capsule 15-30 mg  15-30 mg Oral QHS PRN Gaynelle Arabian, MD   15 mg at 03/30/17 2235     Discharge Medications: Please see discharge summary for a list of discharge medications.  Relevant Imaging Results:  Relevant Lab Results:   Additional Information [id number]  Haidinger, Randall An, LCSW

## 2017-03-31 NOTE — Evaluation (Signed)
Physical Therapy Evaluation Patient Details Name: Stacy Parker MRN: [id number] DOB: [birth year] Today's Date: 03/31/2017   History of Present Illness  Pt s/p R THR and with hx of 8 level spinal fusion and spinal neurostimulator in place  Clinical Impression  Pt progressing steadily with mobility and eager for dc to rehab.  Reviewed car transfers with pt and family.    Follow Up Recommendations DC plan and follow up therapy as arranged by surgeon    Equipment Recommendations  None recommended by PT    Recommendations for Other Services       Precautions / Restrictions Precautions Precautions: Fall Restrictions Weight Bearing Restrictions: No Other Position/Activity Restrictions: WBAT      Mobility  Bed Mobility Overal bed mobility: Needs Assistance Bed Mobility: Supine to Sit     Supine to sit: Min assist     General bed mobility comments: cues for sequence and use of L LE to self assist.    Transfers Overall transfer level: Needs assistance Equipment used: Rolling walker (2 wheeled) Transfers: Sit to/from Stand Sit to Stand: Min guard         General transfer comment: cues for LE management and use of UEs to self assist  Ambulation/Gait Ambulation/Gait assistance: Min guard Ambulation Distance (Feet): 130 Feet Assistive device: Rolling walker (2 wheeled) Gait Pattern/deviations: Step-to pattern;Step-through pattern;Decreased step length - right;Decreased step length - left;Shuffle;Trunk flexed Gait velocity: decr Gait velocity interpretation: Below normal speed for age/gender General Gait Details: min cues for posture, position from RW and initial sequence  Stairs            Wheelchair Mobility    Modified Rankin (Stroke Patients Only)       Balance                                             Pertinent Vitals/Pain Pain Assessment: 0-10 Pain Score: 5  Pain Location: R hip/thigh Pain Descriptors / Indicators:  Aching;Sore Pain Intervention(s): Limited activity within patient's tolerance;Premedicated before session;Monitored during session    Home Living                        Prior Function                 Hand Dominance        Extremity/Trunk Assessment                Communication      Cognition Arousal/Alertness: Awake/alert Behavior During Therapy: WFL for tasks assessed/performed Overall Cognitive Status: Within Functional Limits for tasks assessed                                        General Comments      Exercises Total Joint Exercises Ankle Circles/Pumps: AROM;20 reps;Supine;Both Quad Sets: AROM;Both;10 reps;Supine Heel Slides: AAROM;Right;20 reps;Supine Hip ABduction/ADduction: AAROM;Right;15 reps;Supine Long Arc Quad: AAROM;AROM;Right;15 reps;Seated   Assessment/Plan    PT Assessment    PT Problem List         PT Treatment Interventions      PT Goals (Current goals can be found in the Care Plan section)  Acute Rehab PT Goals Patient Stated Goal: Regain IND PT Goal Formulation: With patient Time For Goal Achievement: 04/02/17 Potential  to Achieve Goals: Good    Frequency 7X/week   Barriers to discharge        Co-evaluation               AM-PAC PT "6 Clicks" Daily Activity  Outcome Measure Difficulty turning over in bed (including adjusting bedclothes, sheets and blankets)?: A Little Difficulty moving from lying on back to sitting on the side of the bed? : A Little Difficulty sitting down on and standing up from a chair with arms (e.g., wheelchair, bedside commode, etc,.)?: A Little Help needed moving to and from a bed to chair (including a wheelchair)?: A Little Help needed walking in hospital room?: A Little Help needed climbing 3-5 steps with a railing? : A Little 6 Click Score: 18    End of Session Equipment Utilized During Treatment: Gait belt Activity Tolerance: Patient tolerated treatment  well Patient left: Other (comment) (bathroom - CNA aware) Nurse Communication: Mobility status PT Visit Diagnosis: Unsteadiness on feet (R26.81);Difficulty in walking, not elsewhere classified (R26.2)    Time: 8206-0156 PT Time Calculation (min) (ACUTE ONLY): 33 min   Charges:     PT Treatments $Gait Training: 8-22 mins $Therapeutic Exercise: 8-22 mins   PT G Codes:        Pg [phone]   Surya Schroeter 03/31/2017, 11:19 AM

## 2017-03-31 NOTE — Discharge Summary (Signed)
Physician Discharge Summary  Patient ID: Stacy Parker MRN: [id number] DOB/AGE: 1933-12-28 83 y.o.  Admit date: 03/29/2017 Discharge date: 03/31/2017  Admission Diagnoses:Osteoarthritis right hip  Discharge Diagnoses:  Principal Problem:   OA (osteoarthritis) of hip   Discharged Condition: good  Hospital Course: The patient underwent a right Total Hip Arthroplasty on 2/77/41 without complications. She went to the Orthopaedic floor post-operatively and remained hemodynamically stable throughout her hospital course. SHe began physical therapy on 5/24 and is WBAT right lower extremity. Her hemoglobin is stable and electrolytes within normal limits. She progressed well with physical therapy and is ready for discharge on 5/25, tolerating a regular diet and in stable condition. Her incision is well healed and she may shower as of 5/26.  Consults: None  Significant Diagnostic Studies: labs: See notes  Treatments: Physical and Occupational therapy  Discharge Exam: Blood pressure (!) 131/55, pulse 75, temperature 99.2 F (37.3 C), temperature source Oral, resp. rate 16, height 5\' 2"  (1.575 m), weight 68 kg (150 lb), SpO2 96 %. General appearance: alert and cooperative Resp: clear to auscultation bilaterally Cardio: regular rate and rhythm, S1, S2 normal, no murmur, click, rub or gallop GI: soft, non-tender; bowel sounds normal; no masses,  no organomegaly Incision/Wound:  Disposition: SNF   Allergies as of 03/31/2017      Reactions   Ace Inhibitors Other (See Comments)   Unsure of what reaction was   Ambien  [zolpidem Tartrate] Diarrhea   Boniva [ibandronic Acid] Other (See Comments)   Flu like symptoms STATES ALL THE OSTEOPOROSIS DRUGS HAVE CAUSED FLU LIKE SYMPTOMS    Ceftin  [cefuroxime Axetil] Diarrhea, Swelling   Cymbalta [duloxetine Hcl] Nausea Only   Dicloxacillin Diarrhea   Has patient had a PCN reaction causing immediate rash, facial/tongue/throat swelling, SOB or  lightheadedness with hypotension:unsure Has patient had a PCN reaction causing severe rash involving mucus membranes or skin necrosis:unsure Has patient had a PCN reaction that required hospitalization:NO Has patient had a PCN reaction occurring within the last 10 years:unsure If all of the above answers are "NO", then may proceed with Cephalosporin use.   Doxycycline Other (See Comments), Nausea Only   unknown   Metronidazole Nausea And Vomiting   Phenergan [promethazine Hcl] Other (See Comments)   Other Reaction: CNS Disorder Cough/nausea/sweating   Succinylcholine Other (See Comments)   Muscle soreness all over body post operatively ("felt like I was hit by [employer] truck")      Medication List    STOP taking these medications   amoxicillin 500 MG capsule Commonly known as:  AMOXIL   aspirin EC 81 MG tablet   Biotin 10000 MCG Tabs   CAL-MAG-ZINC PO   CALCIUM CITRATE PO   cholecalciferol 1000 units tablet Commonly known as:  VITAMIN D   Fish Oil 1200 MG Caps   multivitamin tablet   Turmeric 500 MG Tabs   vitamin C 250 MG tablet Commonly known as:  ASCORBIC ACID     TAKE these medications   ALIGN PO Take 4 mg by mouth every evening. ONE DAILY   amLODipine 5 MG tablet Commonly known as:  NORVASC Take 5 mg by mouth every evening.   citalopram 40 MG tablet Commonly known as:  CELEXA Take 40 mg by mouth daily.   docusate sodium 100 MG capsule Commonly known as:  COLACE Take 100 mg by mouth 2 (two) times daily.   finasteride 5 MG tablet Commonly known as:  PROSCAR Take 2.5 mg by mouth at bedtime.  Magnesium 250 MG Tabs Take 250 mg by mouth daily.   Melatonin 5 MG Tabs Take 5 mg by mouth at bedtime.   methocarbamol 500 MG tablet Commonly known as:  ROBAXIN Take 500 mg by mouth daily as needed (for muscle spasm/cramps (RARELY USES)).   oxyCODONE 5 MG immediate release tablet Commonly known as:  Oxy IR/ROXICODONE Take 1-2 tablets (5-10 mg total) by  mouth every 4 (four) hours as needed for breakthrough pain.   pantoprazole 40 MG tablet Commonly known as:  PROTONIX Take 40 mg by mouth daily.   QUININE SULFATE PO Take 300 mg by mouth at bedtime. For cramps   rivaroxaban 10 MG Tabs tablet Commonly known as:  XARELTO Take 1 tablet (10 mg total) by mouth daily with breakfast.   SYSTANE 0.4-0.3 % Soln Generic drug:  Polyethyl Glycol-Propyl Glycol Place 1-2 drops into both eyes 2 (two) times daily.   XLEAR SINUS CARE SPRAY NA Place 2 sprays into the nose at bedtime.       Follow-up Information    Gaynelle Arabian, MD. Schedule an appointment as soon as possible for a visit on 04/11/2017.   Specialty:  Orthopedic Surgery Why:  Call [phone] tomorrow to make the appointment Contact information: [address] [place] [address] [phone]           Signed: Gearlean Alf 03/31/2017, 1:06 PM

## 2017-04-04 DIAGNOSIS — F339 Major depressive disorder, recurrent, unspecified: Secondary | ICD-10-CM | POA: Diagnosis not present

## 2017-04-04 DIAGNOSIS — Z471 Aftercare following joint replacement surgery: Secondary | ICD-10-CM | POA: Diagnosis not present

## 2017-04-04 DIAGNOSIS — I10 Essential (primary) hypertension: Secondary | ICD-10-CM | POA: Diagnosis not present

## 2017-04-04 DIAGNOSIS — Z86718 Personal history of other venous thrombosis and embolism: Secondary | ICD-10-CM | POA: Diagnosis not present

## 2017-04-04 DIAGNOSIS — M1611 Unilateral primary osteoarthritis, right hip: Secondary | ICD-10-CM | POA: Diagnosis not present

## 2017-04-10 DIAGNOSIS — Z471 Aftercare following joint replacement surgery: Secondary | ICD-10-CM | POA: Diagnosis not present

## 2017-04-10 DIAGNOSIS — R262 Difficulty in walking, not elsewhere classified: Secondary | ICD-10-CM | POA: Diagnosis not present

## 2017-04-10 DIAGNOSIS — Z96641 Presence of right artificial hip joint: Secondary | ICD-10-CM | POA: Diagnosis not present

## 2017-04-10 DIAGNOSIS — M6281 Muscle weakness (generalized): Secondary | ICD-10-CM | POA: Diagnosis not present

## 2017-04-10 NOTE — Addendum Note (Signed)
Addendum  created 04/10/17 1446 by Myrtie Soman, MD   Sign clinical note

## 2017-04-10 NOTE — Anesthesia Postprocedure Evaluation (Signed)
Anesthesia Post Note  Patient: Stacy Parker  Procedure(s) Performed: Procedure(s) (LRB): RIGHT TOTAL HIP ARTHROPLASTY ANTERIOR APPROACH (Right)     Anesthesia Post Evaluation  Last Vitals:  Vitals:   03/31/17 0718 03/31/17 1411  BP: (!) 131/55 (!) 125/57  Pulse: 75 80  Resp: 16 16  Temp: 37.3 C 37.4 C    Last Pain:  Vitals:   03/31/17 1411  TempSrc: Oral  PainSc:                  Shataria Crist S

## 2017-04-11 DIAGNOSIS — R262 Difficulty in walking, not elsewhere classified: Secondary | ICD-10-CM | POA: Diagnosis not present

## 2017-04-11 DIAGNOSIS — Z96641 Presence of right artificial hip joint: Secondary | ICD-10-CM | POA: Diagnosis not present

## 2017-04-11 DIAGNOSIS — M1611 Unilateral primary osteoarthritis, right hip: Secondary | ICD-10-CM | POA: Diagnosis not present

## 2017-04-11 DIAGNOSIS — M6281 Muscle weakness (generalized): Secondary | ICD-10-CM | POA: Diagnosis not present

## 2017-04-11 DIAGNOSIS — Z471 Aftercare following joint replacement surgery: Secondary | ICD-10-CM | POA: Diagnosis not present

## 2017-04-13 DIAGNOSIS — M6281 Muscle weakness (generalized): Secondary | ICD-10-CM | POA: Diagnosis not present

## 2017-04-13 DIAGNOSIS — R262 Difficulty in walking, not elsewhere classified: Secondary | ICD-10-CM | POA: Diagnosis not present

## 2017-04-13 DIAGNOSIS — Z96641 Presence of right artificial hip joint: Secondary | ICD-10-CM | POA: Diagnosis not present

## 2017-04-13 DIAGNOSIS — Z471 Aftercare following joint replacement surgery: Secondary | ICD-10-CM | POA: Diagnosis not present

## 2017-04-17 DIAGNOSIS — M6281 Muscle weakness (generalized): Secondary | ICD-10-CM | POA: Diagnosis not present

## 2017-04-17 DIAGNOSIS — Z471 Aftercare following joint replacement surgery: Secondary | ICD-10-CM | POA: Diagnosis not present

## 2017-04-17 DIAGNOSIS — Z96641 Presence of right artificial hip joint: Secondary | ICD-10-CM | POA: Diagnosis not present

## 2017-04-17 DIAGNOSIS — R262 Difficulty in walking, not elsewhere classified: Secondary | ICD-10-CM | POA: Diagnosis not present

## 2017-04-19 DIAGNOSIS — R262 Difficulty in walking, not elsewhere classified: Secondary | ICD-10-CM | POA: Diagnosis not present

## 2017-04-19 DIAGNOSIS — Z96641 Presence of right artificial hip joint: Secondary | ICD-10-CM | POA: Diagnosis not present

## 2017-04-19 DIAGNOSIS — M6281 Muscle weakness (generalized): Secondary | ICD-10-CM | POA: Diagnosis not present

## 2017-04-19 DIAGNOSIS — Z471 Aftercare following joint replacement surgery: Secondary | ICD-10-CM | POA: Diagnosis not present

## 2017-04-21 DIAGNOSIS — M6281 Muscle weakness (generalized): Secondary | ICD-10-CM | POA: Diagnosis not present

## 2017-04-21 DIAGNOSIS — R262 Difficulty in walking, not elsewhere classified: Secondary | ICD-10-CM | POA: Diagnosis not present

## 2017-04-21 DIAGNOSIS — Z96641 Presence of right artificial hip joint: Secondary | ICD-10-CM | POA: Diagnosis not present

## 2017-04-21 DIAGNOSIS — Z471 Aftercare following joint replacement surgery: Secondary | ICD-10-CM | POA: Diagnosis not present

## 2017-04-24 DIAGNOSIS — M6281 Muscle weakness (generalized): Secondary | ICD-10-CM | POA: Diagnosis not present

## 2017-04-24 DIAGNOSIS — Z96641 Presence of right artificial hip joint: Secondary | ICD-10-CM | POA: Diagnosis not present

## 2017-04-24 DIAGNOSIS — Z471 Aftercare following joint replacement surgery: Secondary | ICD-10-CM | POA: Diagnosis not present

## 2017-04-24 DIAGNOSIS — R262 Difficulty in walking, not elsewhere classified: Secondary | ICD-10-CM | POA: Diagnosis not present

## 2017-04-26 DIAGNOSIS — R262 Difficulty in walking, not elsewhere classified: Secondary | ICD-10-CM | POA: Diagnosis not present

## 2017-04-26 DIAGNOSIS — M6281 Muscle weakness (generalized): Secondary | ICD-10-CM | POA: Diagnosis not present

## 2017-04-26 DIAGNOSIS — Z471 Aftercare following joint replacement surgery: Secondary | ICD-10-CM | POA: Diagnosis not present

## 2017-04-26 DIAGNOSIS — Z96641 Presence of right artificial hip joint: Secondary | ICD-10-CM | POA: Diagnosis not present

## 2017-04-28 DIAGNOSIS — Z471 Aftercare following joint replacement surgery: Secondary | ICD-10-CM | POA: Diagnosis not present

## 2017-04-28 DIAGNOSIS — R262 Difficulty in walking, not elsewhere classified: Secondary | ICD-10-CM | POA: Diagnosis not present

## 2017-04-28 DIAGNOSIS — Z96641 Presence of right artificial hip joint: Secondary | ICD-10-CM | POA: Diagnosis not present

## 2017-04-28 DIAGNOSIS — M6281 Muscle weakness (generalized): Secondary | ICD-10-CM | POA: Diagnosis not present

## 2017-05-01 DIAGNOSIS — Z471 Aftercare following joint replacement surgery: Secondary | ICD-10-CM | POA: Diagnosis not present

## 2017-05-01 DIAGNOSIS — M6281 Muscle weakness (generalized): Secondary | ICD-10-CM | POA: Diagnosis not present

## 2017-05-01 DIAGNOSIS — Z96641 Presence of right artificial hip joint: Secondary | ICD-10-CM | POA: Diagnosis not present

## 2017-05-01 DIAGNOSIS — R262 Difficulty in walking, not elsewhere classified: Secondary | ICD-10-CM | POA: Diagnosis not present

## 2017-05-02 DIAGNOSIS — Z96641 Presence of right artificial hip joint: Secondary | ICD-10-CM | POA: Diagnosis not present

## 2017-05-02 DIAGNOSIS — Z471 Aftercare following joint replacement surgery: Secondary | ICD-10-CM | POA: Diagnosis not present

## 2017-05-03 DIAGNOSIS — R262 Difficulty in walking, not elsewhere classified: Secondary | ICD-10-CM | POA: Diagnosis not present

## 2017-05-03 DIAGNOSIS — M6281 Muscle weakness (generalized): Secondary | ICD-10-CM | POA: Diagnosis not present

## 2017-05-03 DIAGNOSIS — Z96641 Presence of right artificial hip joint: Secondary | ICD-10-CM | POA: Diagnosis not present

## 2017-05-03 DIAGNOSIS — Z471 Aftercare following joint replacement surgery: Secondary | ICD-10-CM | POA: Diagnosis not present

## 2017-05-05 DIAGNOSIS — M6281 Muscle weakness (generalized): Secondary | ICD-10-CM | POA: Diagnosis not present

## 2017-05-05 DIAGNOSIS — Z96641 Presence of right artificial hip joint: Secondary | ICD-10-CM | POA: Diagnosis not present

## 2017-05-05 DIAGNOSIS — Z471 Aftercare following joint replacement surgery: Secondary | ICD-10-CM | POA: Diagnosis not present

## 2017-05-05 DIAGNOSIS — R262 Difficulty in walking, not elsewhere classified: Secondary | ICD-10-CM | POA: Diagnosis not present

## 2017-05-09 DIAGNOSIS — Z471 Aftercare following joint replacement surgery: Secondary | ICD-10-CM | POA: Diagnosis not present

## 2017-05-09 DIAGNOSIS — Z96641 Presence of right artificial hip joint: Secondary | ICD-10-CM | POA: Diagnosis not present

## 2017-05-09 DIAGNOSIS — M6281 Muscle weakness (generalized): Secondary | ICD-10-CM | POA: Diagnosis not present

## 2017-05-09 DIAGNOSIS — R262 Difficulty in walking, not elsewhere classified: Secondary | ICD-10-CM | POA: Diagnosis not present

## 2017-05-10 DIAGNOSIS — M6281 Muscle weakness (generalized): Secondary | ICD-10-CM | POA: Diagnosis not present

## 2017-05-10 DIAGNOSIS — R262 Difficulty in walking, not elsewhere classified: Secondary | ICD-10-CM | POA: Diagnosis not present

## 2017-05-10 DIAGNOSIS — Z96641 Presence of right artificial hip joint: Secondary | ICD-10-CM | POA: Diagnosis not present

## 2017-05-10 DIAGNOSIS — Z471 Aftercare following joint replacement surgery: Secondary | ICD-10-CM | POA: Diagnosis not present

## 2017-05-11 DIAGNOSIS — H35372 Puckering of macula, left eye: Secondary | ICD-10-CM | POA: Diagnosis not present

## 2017-05-12 DIAGNOSIS — M6281 Muscle weakness (generalized): Secondary | ICD-10-CM | POA: Diagnosis not present

## 2017-05-12 DIAGNOSIS — R262 Difficulty in walking, not elsewhere classified: Secondary | ICD-10-CM | POA: Diagnosis not present

## 2017-05-12 DIAGNOSIS — Z471 Aftercare following joint replacement surgery: Secondary | ICD-10-CM | POA: Diagnosis not present

## 2017-05-12 DIAGNOSIS — Z96641 Presence of right artificial hip joint: Secondary | ICD-10-CM | POA: Diagnosis not present

## 2017-05-15 DIAGNOSIS — R262 Difficulty in walking, not elsewhere classified: Secondary | ICD-10-CM | POA: Diagnosis not present

## 2017-05-15 DIAGNOSIS — Z96641 Presence of right artificial hip joint: Secondary | ICD-10-CM | POA: Diagnosis not present

## 2017-05-15 DIAGNOSIS — Z471 Aftercare following joint replacement surgery: Secondary | ICD-10-CM | POA: Diagnosis not present

## 2017-05-15 DIAGNOSIS — M6281 Muscle weakness (generalized): Secondary | ICD-10-CM | POA: Diagnosis not present

## 2017-05-17 DIAGNOSIS — Z471 Aftercare following joint replacement surgery: Secondary | ICD-10-CM | POA: Diagnosis not present

## 2017-05-17 DIAGNOSIS — R262 Difficulty in walking, not elsewhere classified: Secondary | ICD-10-CM | POA: Diagnosis not present

## 2017-05-17 DIAGNOSIS — M6281 Muscle weakness (generalized): Secondary | ICD-10-CM | POA: Diagnosis not present

## 2017-05-17 DIAGNOSIS — Z96641 Presence of right artificial hip joint: Secondary | ICD-10-CM | POA: Diagnosis not present

## 2017-05-19 DIAGNOSIS — R262 Difficulty in walking, not elsewhere classified: Secondary | ICD-10-CM | POA: Diagnosis not present

## 2017-05-19 DIAGNOSIS — Z96641 Presence of right artificial hip joint: Secondary | ICD-10-CM | POA: Diagnosis not present

## 2017-05-19 DIAGNOSIS — M6281 Muscle weakness (generalized): Secondary | ICD-10-CM | POA: Diagnosis not present

## 2017-05-19 DIAGNOSIS — Z471 Aftercare following joint replacement surgery: Secondary | ICD-10-CM | POA: Diagnosis not present

## 2017-05-22 DIAGNOSIS — R262 Difficulty in walking, not elsewhere classified: Secondary | ICD-10-CM | POA: Diagnosis not present

## 2017-05-22 DIAGNOSIS — Z471 Aftercare following joint replacement surgery: Secondary | ICD-10-CM | POA: Diagnosis not present

## 2017-05-22 DIAGNOSIS — Z96641 Presence of right artificial hip joint: Secondary | ICD-10-CM | POA: Diagnosis not present

## 2017-05-22 DIAGNOSIS — M6281 Muscle weakness (generalized): Secondary | ICD-10-CM | POA: Diagnosis not present

## 2017-05-24 DIAGNOSIS — Z96641 Presence of right artificial hip joint: Secondary | ICD-10-CM | POA: Diagnosis not present

## 2017-05-24 DIAGNOSIS — Z471 Aftercare following joint replacement surgery: Secondary | ICD-10-CM | POA: Diagnosis not present

## 2017-05-24 DIAGNOSIS — R262 Difficulty in walking, not elsewhere classified: Secondary | ICD-10-CM | POA: Diagnosis not present

## 2017-05-24 DIAGNOSIS — M6281 Muscle weakness (generalized): Secondary | ICD-10-CM | POA: Diagnosis not present

## 2017-05-26 DIAGNOSIS — M6281 Muscle weakness (generalized): Secondary | ICD-10-CM | POA: Diagnosis not present

## 2017-05-26 DIAGNOSIS — Z471 Aftercare following joint replacement surgery: Secondary | ICD-10-CM | POA: Diagnosis not present

## 2017-05-26 DIAGNOSIS — Z96641 Presence of right artificial hip joint: Secondary | ICD-10-CM | POA: Diagnosis not present

## 2017-05-26 DIAGNOSIS — R262 Difficulty in walking, not elsewhere classified: Secondary | ICD-10-CM | POA: Diagnosis not present

## 2017-05-29 DIAGNOSIS — F418 Other specified anxiety disorders: Secondary | ICD-10-CM | POA: Diagnosis not present

## 2017-05-29 DIAGNOSIS — Z96641 Presence of right artificial hip joint: Secondary | ICD-10-CM | POA: Diagnosis not present

## 2017-05-29 DIAGNOSIS — R209 Unspecified disturbances of skin sensation: Secondary | ICD-10-CM | POA: Diagnosis not present

## 2017-05-29 DIAGNOSIS — Z6827 Body mass index (BMI) 27.0-27.9, adult: Secondary | ICD-10-CM | POA: Diagnosis not present

## 2017-05-29 DIAGNOSIS — Z86718 Personal history of other venous thrombosis and embolism: Secondary | ICD-10-CM | POA: Diagnosis not present

## 2017-05-29 DIAGNOSIS — K219 Gastro-esophageal reflux disease without esophagitis: Secondary | ICD-10-CM | POA: Diagnosis not present

## 2017-05-29 DIAGNOSIS — M545 Low back pain: Secondary | ICD-10-CM | POA: Diagnosis not present

## 2017-05-29 DIAGNOSIS — M5380 Other specified dorsopathies, site unspecified: Secondary | ICD-10-CM | POA: Diagnosis not present

## 2017-05-29 DIAGNOSIS — M6281 Muscle weakness (generalized): Secondary | ICD-10-CM | POA: Diagnosis not present

## 2017-05-29 DIAGNOSIS — I10 Essential (primary) hypertension: Secondary | ICD-10-CM | POA: Diagnosis not present

## 2017-05-29 DIAGNOSIS — Z471 Aftercare following joint replacement surgery: Secondary | ICD-10-CM | POA: Diagnosis not present

## 2017-05-29 DIAGNOSIS — M419 Scoliosis, unspecified: Secondary | ICD-10-CM | POA: Diagnosis not present

## 2017-05-29 DIAGNOSIS — M1990 Unspecified osteoarthritis, unspecified site: Secondary | ICD-10-CM | POA: Diagnosis not present

## 2017-05-29 DIAGNOSIS — M810 Age-related osteoporosis without current pathological fracture: Secondary | ICD-10-CM | POA: Diagnosis not present

## 2017-05-29 DIAGNOSIS — R262 Difficulty in walking, not elsewhere classified: Secondary | ICD-10-CM | POA: Diagnosis not present

## 2017-05-31 DIAGNOSIS — R262 Difficulty in walking, not elsewhere classified: Secondary | ICD-10-CM | POA: Diagnosis not present

## 2017-05-31 DIAGNOSIS — Z96641 Presence of right artificial hip joint: Secondary | ICD-10-CM | POA: Diagnosis not present

## 2017-05-31 DIAGNOSIS — M6281 Muscle weakness (generalized): Secondary | ICD-10-CM | POA: Diagnosis not present

## 2017-05-31 DIAGNOSIS — Z471 Aftercare following joint replacement surgery: Secondary | ICD-10-CM | POA: Diagnosis not present

## 2017-06-06 DIAGNOSIS — M1611 Unilateral primary osteoarthritis, right hip: Secondary | ICD-10-CM | POA: Diagnosis not present

## 2017-06-16 DIAGNOSIS — H353121 Nonexudative age-related macular degeneration, left eye, early dry stage: Secondary | ICD-10-CM | POA: Diagnosis not present

## 2017-06-16 DIAGNOSIS — Z961 Presence of intraocular lens: Secondary | ICD-10-CM | POA: Diagnosis not present

## 2017-06-16 DIAGNOSIS — H5213 Myopia, bilateral: Secondary | ICD-10-CM | POA: Diagnosis not present

## 2017-06-16 DIAGNOSIS — H35361 Drusen (degenerative) of macula, right eye: Secondary | ICD-10-CM | POA: Diagnosis not present

## 2017-06-16 DIAGNOSIS — H35372 Puckering of macula, left eye: Secondary | ICD-10-CM | POA: Diagnosis not present

## 2017-06-16 DIAGNOSIS — H52223 Regular astigmatism, bilateral: Secondary | ICD-10-CM | POA: Diagnosis not present

## 2017-06-21 ENCOUNTER — Encounter: Payer: Medicare Other | Admitting: Women's Health

## 2017-08-10 DIAGNOSIS — Z23 Encounter for immunization: Secondary | ICD-10-CM | POA: Diagnosis not present

## 2017-09-01 DIAGNOSIS — L820 Inflamed seborrheic keratosis: Secondary | ICD-10-CM | POA: Diagnosis not present

## 2017-09-01 DIAGNOSIS — Z79899 Other long term (current) drug therapy: Secondary | ICD-10-CM | POA: Diagnosis not present

## 2017-09-01 DIAGNOSIS — L821 Other seborrheic keratosis: Secondary | ICD-10-CM | POA: Diagnosis not present

## 2017-09-01 DIAGNOSIS — D225 Melanocytic nevi of trunk: Secondary | ICD-10-CM | POA: Diagnosis not present

## 2017-09-01 DIAGNOSIS — Z85828 Personal history of other malignant neoplasm of skin: Secondary | ICD-10-CM | POA: Diagnosis not present

## 2017-09-01 DIAGNOSIS — L649 Androgenic alopecia, unspecified: Secondary | ICD-10-CM | POA: Diagnosis not present

## 2017-09-01 DIAGNOSIS — L738 Other specified follicular disorders: Secondary | ICD-10-CM | POA: Diagnosis not present

## 2017-09-01 DIAGNOSIS — L570 Actinic keratosis: Secondary | ICD-10-CM | POA: Diagnosis not present

## 2017-09-01 DIAGNOSIS — D2272 Melanocytic nevi of left lower limb, including hip: Secondary | ICD-10-CM | POA: Diagnosis not present

## 2017-09-01 DIAGNOSIS — L918 Other hypertrophic disorders of the skin: Secondary | ICD-10-CM | POA: Diagnosis not present

## 2017-09-01 DIAGNOSIS — L814 Other melanin hyperpigmentation: Secondary | ICD-10-CM | POA: Diagnosis not present

## 2017-09-06 DIAGNOSIS — L649 Androgenic alopecia, unspecified: Secondary | ICD-10-CM | POA: Diagnosis not present

## 2017-09-13 DIAGNOSIS — Z0189 Encounter for other specified special examinations: Secondary | ICD-10-CM | POA: Diagnosis not present

## 2017-09-13 DIAGNOSIS — L649 Androgenic alopecia, unspecified: Secondary | ICD-10-CM | POA: Diagnosis not present

## 2017-09-14 DIAGNOSIS — K219 Gastro-esophageal reflux disease without esophagitis: Secondary | ICD-10-CM | POA: Diagnosis not present

## 2017-09-18 ENCOUNTER — Other Ambulatory Visit: Payer: Self-pay | Admitting: Surgery

## 2017-09-18 DIAGNOSIS — K219 Gastro-esophageal reflux disease without esophagitis: Secondary | ICD-10-CM

## 2017-09-20 ENCOUNTER — Ambulatory Visit
Admission: RE | Admit: 2017-09-20 | Discharge: 2017-09-20 | Disposition: A | Discharge status: Home / Self Care | Payer: Medicare Other | Source: Ambulatory Visit | Attending: Surgery | Admitting: Surgery

## 2017-09-20 ENCOUNTER — Other Ambulatory Visit: Payer: Self-pay | Admitting: Surgery

## 2017-09-20 DIAGNOSIS — K219 Gastro-esophageal reflux disease without esophagitis: Secondary | ICD-10-CM

## 2017-09-20 DIAGNOSIS — K449 Diaphragmatic hernia without obstruction or gangrene: Secondary | ICD-10-CM | POA: Diagnosis not present

## 2017-10-11 DIAGNOSIS — K219 Gastro-esophageal reflux disease without esophagitis: Secondary | ICD-10-CM | POA: Diagnosis not present

## 2017-10-19 DIAGNOSIS — R790 Abnormal level of blood mineral: Secondary | ICD-10-CM | POA: Diagnosis not present

## 2017-10-19 DIAGNOSIS — M1990 Unspecified osteoarthritis, unspecified site: Secondary | ICD-10-CM | POA: Diagnosis not present

## 2017-10-19 DIAGNOSIS — E663 Overweight: Secondary | ICD-10-CM | POA: Diagnosis not present

## 2017-10-19 DIAGNOSIS — R202 Paresthesia of skin: Secondary | ICD-10-CM | POA: Diagnosis not present

## 2017-10-19 DIAGNOSIS — M5380 Other specified dorsopathies, site unspecified: Secondary | ICD-10-CM | POA: Diagnosis not present

## 2017-10-19 DIAGNOSIS — M545 Low back pain: Secondary | ICD-10-CM | POA: Diagnosis not present

## 2017-10-19 DIAGNOSIS — Z6827 Body mass index (BMI) 27.0-27.9, adult: Secondary | ICD-10-CM | POA: Diagnosis not present

## 2017-10-19 DIAGNOSIS — I10 Essential (primary) hypertension: Secondary | ICD-10-CM | POA: Diagnosis not present

## 2017-10-19 DIAGNOSIS — Z130 Encounter for screening for diseases of the blood and blood-forming organs and certain disorders involving the immune mechanism: Secondary | ICD-10-CM | POA: Diagnosis not present

## 2017-10-19 DIAGNOSIS — M810 Age-related osteoporosis without current pathological fracture: Secondary | ICD-10-CM | POA: Diagnosis not present

## 2017-10-19 DIAGNOSIS — F418 Other specified anxiety disorders: Secondary | ICD-10-CM | POA: Diagnosis not present

## 2017-10-19 DIAGNOSIS — Z86718 Personal history of other venous thrombosis and embolism: Secondary | ICD-10-CM | POA: Diagnosis not present

## 2017-11-01 DIAGNOSIS — Z1231 Encounter for screening mammogram for malignant neoplasm of breast: Secondary | ICD-10-CM | POA: Diagnosis not present

## 2017-11-09 ENCOUNTER — Telehealth: Payer: Self-pay | Admitting: Neurology

## 2017-11-09 NOTE — Telephone Encounter (Signed)
Called the patient back, pt is upset that since she hasn't been seen here in 3 years she is requiring an referral to be seen by Dr Brett Fairy. I explained that is our policy. The patient just wanted to bring the card in and let her look at her download and Dr Dohmeier review it and make orders off of that. She states that the PCP is out of the office for 2 weeks and she is upset that she has to wait for him to return before anything is done. I apologized for the inconvenience but explained that is the policy that Dr Dohmeier has to see her before writing orders and before ordering a new machine. Pt states she will work on this but isn't happy that she has to go through all these hoops.

## 2017-11-09 NOTE — Telephone Encounter (Signed)
Call pt at [phone] if after 9:30AM on 11/09/17.Pt said call is regarding "being forced to see Dr. Brett Fairy" but didn't want to discuss any further.

## 2017-11-14 ENCOUNTER — Encounter: Payer: Self-pay | Admitting: Neurology

## 2017-11-15 ENCOUNTER — Encounter: Payer: Self-pay | Admitting: Neurology

## 2017-11-15 ENCOUNTER — Ambulatory Visit (INDEPENDENT_AMBULATORY_CARE_PROVIDER_SITE_OTHER): Payer: Medicare Other | Admitting: Neurology

## 2017-11-15 VITALS — BP 106/63 | HR 78 | Ht 63.0 in | Wt 159.0 lb

## 2017-11-15 DIAGNOSIS — Z7189 Other specified counseling: Secondary | ICD-10-CM | POA: Diagnosis not present

## 2017-11-15 DIAGNOSIS — G4731 Primary central sleep apnea: Secondary | ICD-10-CM

## 2017-11-15 NOTE — Progress Notes (Signed)
SLEEP MEDICINE CLINIC   Provider:  Larey Seat, M D  Primary Care Physician:  Burnard Bunting, MD   Referring Provider: Burnard Bunting, MD    Chief Complaint  Patient presents with  . Follow-up    pt is alone, rm 11, pt is here to discuss getting a new machine. she thinks she got her ASV machine in 2013 through [facility] and is unsure of the month    HPI:  Stacy Parker is a 84 y.o. female , seen here as  referral/ revisit  from Dr. Reynaldo Minium , needing a new BiPAP machine.  Stacy Parker has been an established patient in our practice but I have not seen her in the last 3 years.  She has used positive airway pressure for at least 5 years.  She is still very active going to the [facility], she stopped her citalopram but then went back on after she became a little emotional/ irritable. She also carries a diagnosis of DVT which she developed after back surgery in March 2010, hypertension, GERD, scoliosis, osteoarthritis, she had a thyroid nodule followed at [facility], sacroiliac plasty of August 2010 left hip replacement 2007 right hip replacement 2018 no stimulator implant 2011 and retraction 2017.  Do not have access to her original sleep study right now I will ask my nurse to find it later however the patient was placed on ASV because of complex sleep apnea, she has a setting of a minimum pressure support of 3, maximum pressure support of 10 cmH2O was 6 cm CPAP her residual AHI is 0.3 and her compliance is excellent between 87 and 93 %/months.  Average use of time is 8 hours and 9 minutes.  Chief complaint according to patient : "I need a new machine - and mine is 82 years old. "  Sleep habits are as follows: He takes 5 mg melatonin before sleep onset. She goes to bed between 10 and 11 usually falls asleep within 30 minutes, she sleeps supine,  On one pillow for neck support. She has usually one bathroom break at night, and she can otherwise sleep through until 6:30 AM when she  rises.  She wakes up spontaneously without an alarm.  She reports vivid and adventurous dreams, no nightmares. She feels that there is no difference since she takes melatonin. She reports neither nausea, palpitation, diaphoresis, dry mouth or headaches in the morning.  She feels well rested and refreshed.  Sleep medical history and family sleep history:  See above - iron deficiency anemia, diagnosed by Dr. Rolm Bookbinder, further tested Dr. Ferne Reus. Hairloss.    Social history:  Living with [place] in [place], [place] .  Non smoker, seldomly drinking alcohol. Caffeine - 2 cups in AM ,   Review of Systems: Out of a complete 14 system review, the patient complains of only the following symptoms, and all other reviewed systems are negative.  Epworth score 3 , Fatigue severity score 18  , depression score - on SSRI, was irritable without.    Social History   Socioeconomic History  . Marital status: Married    Spouse name: Not on file  . Number of children: 2  . Years of education: Not on file  . Highest education level: Not on file  Social Needs  . Financial resource strain: Not on file  . Food insecurity - worry: Not on file  . Food insecurity - inability: Not on file  . Transportation needs - medical: Not on  file  . Transportation needs - non-medical: Not on file  Occupational History  . Occupation: retired    Fish farm manager: RETIRED  Tobacco Use  . Smoking status: Former Smoker    Types: Cigarettes  . Smokeless tobacco: Never Used  . Tobacco comment: QUIT SMOKING IN HER EARLY 40'S  AND WAS NEVER A HEAVY SMOKER  Substance and Sexual Activity  . Alcohol use: Yes    Alcohol/week: 0.0 oz    Comment: rarely  . Drug use: No  . Sexual activity: No    Birth control/protection: Surgical    Comment: DECLINED INSURCNACE QUESTIONS  Other Topics Concern  . Not on file  Social History Narrative  . Not on file    Family History  Problem Relation Age of Onset  .  Hypertension Mother   . COPD Mother   . Heart disease Father   . Hypertension Father   . Skin cancer Father   . Breast cancer Maternal Grandmother     Past Medical History:  Diagnosis Date  . Anxiety   . Arthritis   . Basal cell carcinoma   . Cervical arthritis    PT STATES CERVICAL SPINE BADLY DEGENERATED--SHE DOES NOT HAVE NECK PAIN -BUT TOLD HER NECK IS FRAGILE  . Complication of anesthesia    myalgia   . Diverticulosis   . DVT (deep venous thrombosis) (HCC)    Left leg  . GERD (gastroesophageal reflux disease)   . H/O hiatal hernia    repairsed  . HTN (hypertension)   . Neuropathy of foot, left   . Osteopenia   . Osteoporosis   . Reflux   . Sciatic nerve disease   . Sciatic pain    RIGHT LEG / BACK PAIN- PT ON OXYCONTIN 3 TIMES A DAY--STATES MUST CONTINUE WHILE IN HOSPITAL TO AVOID WITHDRAWAL  . Sleep apnea 04/12/2012   cpap  . Squamous carcinoma     Past Surgical History:  Procedure Laterality Date  . BREAST SURGERY     Fibroma, right  . HERNIA REPAIR    . HIATAL HERNIA REPAIR  04/17/2012   Procedure: LAPAROSCOPIC REPAIR OF HIATAL HERNIA;  Surgeon: Pedro Earls, MD;  Location: [facility];  Service: General;  Laterality: N/A;  Large Hiatus Hernia  . INTRAOPERATIVE ARTERIOGRAM    . neuro stimulator     THORACIC AREA-BATTERY IN RIGHT HIP PT TURNS OFF AND ON WITH MAGNET-PT STATES SHE IS PAIN FREE WHEN LYING DOWN -BUT WHEN SITTING OR STANDING SHE EXPERIENCES TINGLING AND PAIN DOWN RIGHT LEG-SCIATIC PAIN  . SPINAL FUSION     8 level  LUMBAR/THORACIC  . TOTAL HIP ARTHROPLASTY     left  . TOTAL HIP ARTHROPLASTY Right 03/29/2017   Procedure: RIGHT TOTAL HIP ARTHROPLASTY ANTERIOR APPROACH;  Surgeon: Gaynelle Arabian, MD;  Location: [facility];  Service: Orthopedics;  Laterality: Right;  . TOTAL VAGINAL HYSTERECTOMY  1993  . TUBAL LIGATION    . verteboplasty      Current Outpatient Medications  Medication Sig Dispense Refill  . amLODipine (NORVASC) 5 MG tablet Take 5 mg by  mouth every evening.   4  . citalopram (CELEXA) 40 MG tablet Take 40 mg by mouth daily.    Marland Kitchen docusate sodium (COLACE) 100 MG capsule Take 100 mg by mouth 2 (two) times daily.    . Magnesium 250 MG TABS Take 250 mg by mouth daily.    . Melatonin 5 MG TABS Take 5 mg by mouth at bedtime.     Marland Kitchen  methocarbamol (ROBAXIN) 500 MG tablet Take 500 mg by mouth daily as needed (for muscle spasm/cramps (RARELY USES)).     . pantoprazole (PROTONIX) 40 MG tablet Take 40 mg by mouth daily.     . Probiotic Product (ALIGN PO) Take 4 mg by mouth every evening. ONE DAILY    . QUININE SULFATE PO Take 300 mg by mouth at bedtime. For cramps     . Sodium Chloride-Xylitol (XLEAR SINUS CARE SPRAY NA) Place 2 sprays into the nose at bedtime.     No current facility-administered medications for this visit.     Allergies as of 11/15/2017 - Review Complete 11/15/2017  Allergen Reaction Noted  . Ace inhibitors Other (See Comments) 06/04/2015  . Ambien  [zolpidem tartrate] Diarrhea 04/18/2014  . Boniva [ibandronic acid] Other (See Comments) 03/19/2012  . Ceftin  [cefuroxime axetil] Diarrhea and Swelling 04/18/2014  . Cymbalta [duloxetine hcl] Nausea Only 01/05/2012  . Dicloxacillin Diarrhea 01/05/2012  . Doxycycline Other (See Comments) and Nausea Only   . Metronidazole Nausea And Vomiting   . Phenergan [promethazine hcl] Other (See Comments) 04/18/2014  . Succinylcholine Other (See Comments) 10/19/2016    Vitals: BP 106/63   Pulse 78   Ht 5\' 3"  (1.6 m)   Wt 159 lb (72.1 kg)   BMI 28.17 kg/m  Last Weight:  Wt Readings from Last 1 Encounters:  11/15/17 159 lb (72.1 kg)   QJJ:HERD mass index is 28.17 kg/m.     Last Height:   Ht Readings from Last 1 Encounters:  11/15/17 5\' 3"  (1.6 m)    Physical exam:  General: The patient is awake, alert and appears not in acute distress. The patient is well groomed. Head: Normocephalic, atraumatic. Neck is supple. Mallampati 2-3 , natural teeth.   neck circumference:  15 . Nasal airflow patent ,  Retrognathia is not seen.  Cardiovascular:  Regular rate and rhythm, without  murmurs or carotid bruit, and without distended neck veins. Respiratory: Lungs are clear to auscultation. Skin:  Without evidence of edema, or rash Trunk: BMI is 28.2. The patient's posture is erect.   Neurologic exam : The patient is awake and alert, oriented to place and time. Attention span & concentration ability appears normal.  Speech is fluent,  without dysarthria, dysphonia or aphasia.  Mood and affect are appropriate.  Cranial nerves: Pupils are equal and briskly reactive to light. Extraocular movements  in vertical and horizontal planes intact and without nystagmus. Visual fields by finger perimetry are intact.Hearing to finger rub intact.  Facial sensation intact to fine touch. Facial motor strength is symmetric and tongue and uvula move midline. Shoulder shrug was symmetrical. Motor exam:  Normal tone, muscle bulk and symmetric strength in all extremities. Sensory:  Fine touch, pinprick and vibration were tested in all extremities.  Coordination:Finger-to-nose maneuver  normal without evidence of ataxia, dysmetria or tremor. Gait and station: Patient walks without assistive device. Strength within normal limits.Stance is stable and normal- un fragmented turns with 3  Steps.  Deep tendon reflexes: in the  upper and lower extremities are symmetric and intact. Babinski maneuver response is  downgoing.  Assessment:  After physical and neurologic examination, review of laboratory studies,  Personal review of imaging studies, reports of other /same  Imaging studies, results of polysomnography and / or neurophysiology testing and pre-existing records as far as provided in visit., my assessment is   1) History of complex apnea- Dx  needs to be confirmed before I can replace her ASV.  Needs to retry CPAP, BiPAP and would have to fail these before she can get a new machine.   The patient  was advised of the nature of the diagnosed disorder , the treatment options and the  risks for general health and wellness arising from not treating the condition. I spent more than 40 minutes of face to face time with the patient.Greater than 50% of time was spent in counseling and coordination of care. We have discussed the diagnosis and differential and I answered the patient's questions.    Plan:  Treatment plan and additional workup :   I will order a split-night polysomnography for the patient I have to confirm that she still has apnea, of which type in which degree before I can give her a new machine.  It is likely that she will have to did also document CPAP intolerance before she can move to another modality.  Her current machine is still working for her so she will keep using it until we have a new test and a new machine available. Wants RESMED ASV.     Larey Seat, MD 07/12/2840, 3:24 PM  Certified in Neurology by ABPN Certified in [place] by [facility] [address], [place], [place] [address]

## 2017-11-15 NOTE — Patient Instructions (Signed)

## 2017-12-04 ENCOUNTER — Institutional Professional Consult (permissible substitution): Payer: Medicare Other | Admitting: Neurology

## 2017-12-06 DIAGNOSIS — Z96641 Presence of right artificial hip joint: Secondary | ICD-10-CM | POA: Diagnosis not present

## 2017-12-06 DIAGNOSIS — M76892 Other specified enthesopathies of left lower limb, excluding foot: Secondary | ICD-10-CM | POA: Diagnosis not present

## 2017-12-06 DIAGNOSIS — M25552 Pain in left hip: Secondary | ICD-10-CM | POA: Diagnosis not present

## 2017-12-12 DIAGNOSIS — M76892 Other specified enthesopathies of left lower limb, excluding foot: Secondary | ICD-10-CM | POA: Diagnosis not present

## 2017-12-12 DIAGNOSIS — M25552 Pain in left hip: Secondary | ICD-10-CM | POA: Diagnosis not present

## 2017-12-13 DIAGNOSIS — M25552 Pain in left hip: Secondary | ICD-10-CM | POA: Diagnosis not present

## 2017-12-13 DIAGNOSIS — Z85828 Personal history of other malignant neoplasm of skin: Secondary | ICD-10-CM | POA: Diagnosis not present

## 2017-12-13 DIAGNOSIS — L918 Other hypertrophic disorders of the skin: Secondary | ICD-10-CM | POA: Diagnosis not present

## 2017-12-13 DIAGNOSIS — L570 Actinic keratosis: Secondary | ICD-10-CM | POA: Diagnosis not present

## 2017-12-13 DIAGNOSIS — L821 Other seborrheic keratosis: Secondary | ICD-10-CM | POA: Diagnosis not present

## 2017-12-13 DIAGNOSIS — M76892 Other specified enthesopathies of left lower limb, excluding foot: Secondary | ICD-10-CM | POA: Diagnosis not present

## 2017-12-14 DIAGNOSIS — M76892 Other specified enthesopathies of left lower limb, excluding foot: Secondary | ICD-10-CM | POA: Diagnosis not present

## 2017-12-14 DIAGNOSIS — M25552 Pain in left hip: Secondary | ICD-10-CM | POA: Diagnosis not present

## 2017-12-19 DIAGNOSIS — M25552 Pain in left hip: Secondary | ICD-10-CM | POA: Diagnosis not present

## 2017-12-19 DIAGNOSIS — M76892 Other specified enthesopathies of left lower limb, excluding foot: Secondary | ICD-10-CM | POA: Diagnosis not present

## 2017-12-20 ENCOUNTER — Ambulatory Visit (INDEPENDENT_AMBULATORY_CARE_PROVIDER_SITE_OTHER): Payer: Medicare Other | Admitting: Neurology

## 2017-12-20 DIAGNOSIS — M25552 Pain in left hip: Secondary | ICD-10-CM | POA: Diagnosis not present

## 2017-12-20 DIAGNOSIS — G4731 Primary central sleep apnea: Secondary | ICD-10-CM | POA: Diagnosis not present

## 2017-12-20 DIAGNOSIS — M76892 Other specified enthesopathies of left lower limb, excluding foot: Secondary | ICD-10-CM | POA: Diagnosis not present

## 2017-12-20 DIAGNOSIS — Z7189 Other specified counseling: Secondary | ICD-10-CM

## 2017-12-21 DIAGNOSIS — M25552 Pain in left hip: Secondary | ICD-10-CM | POA: Diagnosis not present

## 2017-12-21 DIAGNOSIS — M76892 Other specified enthesopathies of left lower limb, excluding foot: Secondary | ICD-10-CM | POA: Diagnosis not present

## 2017-12-25 NOTE — Procedures (Signed)
PATIENT'S NAME:  Stacy, Parker DOB:      02/23/1934      MR#:    [id number]     DATE OF RECORDING: 12/20/2017 REFERRING M.D.:  Burnard Bunting, M.D. Study Performed:   Baseline Polysomnogram HISTORY: Stacy Parker is an 60 yr. old female patient on ASV after being diagnosed with complex sleep apnea over 5 years ago, now needing a new ASV machine.  ASV settings at minimum pressure support of 3 cm water, max at 10 cm water, and EPAP 6 cm water.  Residual AHI was 0.3/hr. and compliance around 90%.  She is very active, has moved with her husband to [place]. She carries a diagnosis of DVT which she developed after back surgery in March 2010, hypertension, GERD, scoliosis, osteoarthritis, thyroid nodule followed at [facility], sacroiliac of August 2010, left hip replacement 2007, right hip replacement 2018, neurostimulator implant 2011 and retraction in 2017. She stopped her citalopram but then went back on after she became a little emotional/ irritable. The patient endorsed the Epworth Sleepiness Scale at 3 points. The FSS at 18 points.   The patient's weight 159 pounds with a height of 63 (inches), resulting in a BMI of 28.1 kg/m2. The patient's neck circumference measured 15 inches.  CURRENT MEDICATIONS: Norvasc, Celexa, Colace, Robaxin, Protonix, Align   PROCEDURE:  This is a multichannel digital polysomnogram utilizing the Somnostar 11.2 system.  Electrodes and sensors were applied and monitored per AASM Specifications.   EEG, EOG, Chin and Limb EMG, were sampled at 200 Hz.  ECG, Snore and Nasal Pressure, Thermal Airflow, Respiratory Effort, CPAP Flow and Pressure, Oximetry was sampled at 50 Hz. Digital video and audio were recorded.      BASELINE STUDY  Lights Out was at 22:25 and Lights On at 05:01.  Total recording time (TRT) was 396.5 minutes, with a total sleep time (TST) of 180.5 minutes.   The patient's sleep latency was 58 minutes.  REM latency was 0 minutes.  The  sleep efficiency was 45.5 %.     SLEEP ARCHITECTURE: WASO (Wake after sleep onset) was 185.5 minutes.  There were 35 minutes in Stage N1, 95 minutes Stage N2, 50.5 minutes Stage N3 and 0 minutes in Stage REM.  The percentage of Stage N1 was 19.4%, Stage N2 was 52.6%, Stage N3 was 28.0% and Stage R (REM sleep) was 0%.    RESPIRATORY ANALYSIS:  There were a total of 34 respiratory events:  2 obstructive apneas, 2 central apneas and 0 mixed apneas with a total of 4 apneas and an apnea index (AI) of 1.3 /hour. There were 30 hypopneas with a hypopnea index of 10.0 /hour. The patient also had 8 respiratory event related arousals (RERAs).  The total APNEA/HYPOPNEA INDEX (AHI) was 11.3/hour and the total RESPIRATORY DISTURBANCE INDEX was 14. /hour.  0 events occurred in REM sleep and 62 events in NREM. The REM AHI was 0 /hour, versus a non-REM AHI of 11.3. The patient spent 135 minutes of total sleep time in the supine position and 46 minutes in non-supine. The supine AHI was 14.7 versus a non-supine AHI of 1.3.  OXYGEN SATURATION & C02:  The Wake baseline 02 saturation was 95%, with the lowest being 83%. Time spent below 89% saturation equaled 87 minutes.   PERIODIC LIMB MOVEMENTS:  The patient had a total of 0 Periodic Limb Movements.  The arousals were noted as: 68 were spontaneous, 0 were associated with PLMs, and 27 were associated with  respiratory events. Snoring was noted.  Audio and video analysis did not show any abnormal or unusual movements, behaviors, phonations or vocalizations. No nocturia. All stages of sleep but REM observed and in supine as well as lateral sleep position.   EKG was in keeping with normal sinus rhythm (NSR).  Post-study, the patient indicated that she felt she hadn't slept at all.   IMPRESSION:  1. Mild Sleep Apnea (Complex as even numbers of central and obstructive apnea/ hypopnea were recorded) 2. Supine sleep accentuated AHI to 14/hr. REM sleep was not seen.    RECOMMENDATIONS: This mild degree of sleep apnea could be addressed by avoiding supine sleep.  1. If avoiding supine sleep is not possible, will need full-night, attended, CPAP titration study to optimize therapy.   2. Avoid sedative-hypnotics which may worsen sleep apnea. 3. Further information regarding OSA may be obtained from [employer] (www.sleepfoundation.org) or American Sleep Apnea Association (www.sleepapnea.org). 4. Consider dedicated sleep psychology referral if sleep perception of insomnia is of clinical concern.   5. A follow up appointment will be scheduled in the Sleep Clinic at [facility]. The referring provider will be notified of the results.      I certify that I have reviewed the entire raw data recording prior to the issuance of this report in accordance with the Standards of Accreditation of the American Academy of Sleep Medicine (AASM)    Larey Seat, MD    12-25-2017  Diplomat, American Board of Psychiatry and Neurology  Diplomat, American Board of [place] Director, [employer] [facility] [employer]

## 2017-12-28 ENCOUNTER — Telehealth: Payer: Self-pay | Admitting: Neurology

## 2017-12-28 DIAGNOSIS — G4733 Obstructive sleep apnea (adult) (pediatric): Secondary | ICD-10-CM

## 2017-12-28 NOTE — Telephone Encounter (Signed)
-----   Message from Larey Seat, MD sent at 12/25/2017  6:59 PM EST ----- This study revealed mild sleep apnea , and a trivial degree of apnea when not sleeping supine. First line treatment for this mild but complex apnea form is avoiding to sleep on the back.  If not possible , proceed to re-titration study.  Please call Mrs. Alleman and let her know -she may be able to implement sleep position changes and not need PAP at all.  If she is concerned about this ( orthopedic reasons, joint or back pain, etc or feeling unable to sleep without PAP ) then I will pursue the work up for a new machine. CD

## 2017-12-28 NOTE — Telephone Encounter (Signed)
Called the pt and made her aware that the sleep study was completed and that it showed mild apnea. The patient is adament in wanting a new machine so I informed her that she would have to come in for a cpap titration test, the patient said she didn't want to do that. I informed her this is a medicare guideline thing in order for her to get the new machine she has to go through the process. The patient says she is going to fight the process with medicare. In the meantime I will place a cpap titration order for the patient.

## 2017-12-29 NOTE — Telephone Encounter (Signed)
Pt called she spoke with medicare and was advised that the clinic would have to start an appeal reg the sleep study. Pt is aware it would be the providers decision if she wanted to pursue an appeal against the pt getting a sleep. Please call to advise. Pt is aware the clinic closes at noon today and this will be addressed next week. Please call to advise.

## 2018-01-02 ENCOUNTER — Other Ambulatory Visit: Payer: Self-pay | Admitting: Neurology

## 2018-01-02 DIAGNOSIS — G4733 Obstructive sleep apnea (adult) (pediatric): Secondary | ICD-10-CM

## 2018-01-02 NOTE — Telephone Encounter (Signed)
I have called the patient back and informed her that we do not call insurance plans to do a appeal. This is a recommendation being made by the doctor and will help with adjusting the pressure. The patient is still not thrilled about coming in and doing this. Our sleep lab manager Shirlean Mylar has offered a suggestion that we just try the patient on a auto cpap. I have explained what this is and what it means and the patient would like to do this. I will talk with Dr Brett Fairy and see what settings the patient must be on and then I will sent the orders to [place].

## 2018-01-02 NOTE — Telephone Encounter (Signed)
Pt is calling back because she has not heard back from anyone re: the appeal process for pt not wanting a 2nd sleep study.  Pt is asking for a call to discuss please.Pt states a message can be left

## 2018-01-02 NOTE — Telephone Encounter (Signed)
Placed the order per DR Dohmeier. Sent to [facility]

## 2018-01-08 ENCOUNTER — Other Ambulatory Visit: Payer: Self-pay | Admitting: Neurology

## 2018-01-08 ENCOUNTER — Telehealth: Payer: Self-pay | Admitting: Neurology

## 2018-01-08 DIAGNOSIS — G4733 Obstructive sleep apnea (adult) (pediatric): Secondary | ICD-10-CM

## 2018-01-08 NOTE — Telephone Encounter (Signed)
Pt returned the call and was able to go over the information with her in regards to her machine and that [facility] was unable to move the patient to a lower machine. The patient has asked can she change DME companies and attempt through someone else.  I offered suggestions and the pt has decided to proceed forward with [facility]. I have sent the information over to [facility]. We will keep current apt in may for her follow up. Pt verbalized understanding and was appreciative for this as she did not want to proceed forward with doing more test.

## 2018-01-08 NOTE — Telephone Encounter (Signed)
Called the patient to update her on the status of her machine. The patient didn't answer. lvm for the patient to call back

## 2018-01-11 DIAGNOSIS — M160 Bilateral primary osteoarthritis of hip: Secondary | ICD-10-CM | POA: Diagnosis not present

## 2018-01-11 DIAGNOSIS — Z96643 Presence of artificial hip joint, bilateral: Secondary | ICD-10-CM | POA: Diagnosis not present

## 2018-01-17 DIAGNOSIS — Z13220 Encounter for screening for lipoid disorders: Secondary | ICD-10-CM | POA: Diagnosis not present

## 2018-01-17 DIAGNOSIS — M810 Age-related osteoporosis without current pathological fracture: Secondary | ICD-10-CM | POA: Diagnosis not present

## 2018-01-17 DIAGNOSIS — Z0000 Encounter for general adult medical examination without abnormal findings: Secondary | ICD-10-CM | POA: Diagnosis not present

## 2018-01-17 DIAGNOSIS — I10 Essential (primary) hypertension: Secondary | ICD-10-CM | POA: Diagnosis not present

## 2018-01-17 DIAGNOSIS — E785 Hyperlipidemia, unspecified: Secondary | ICD-10-CM | POA: Diagnosis not present

## 2018-01-17 DIAGNOSIS — Z130 Encounter for screening for diseases of the blood and blood-forming organs and certain disorders involving the immune mechanism: Secondary | ICD-10-CM | POA: Diagnosis not present

## 2018-01-17 DIAGNOSIS — E559 Vitamin D deficiency, unspecified: Secondary | ICD-10-CM | POA: Diagnosis not present

## 2018-01-17 DIAGNOSIS — D509 Iron deficiency anemia, unspecified: Secondary | ICD-10-CM | POA: Diagnosis not present

## 2018-01-17 DIAGNOSIS — L659 Nonscarring hair loss, unspecified: Secondary | ICD-10-CM | POA: Diagnosis not present

## 2018-01-17 DIAGNOSIS — R209 Unspecified disturbances of skin sensation: Secondary | ICD-10-CM | POA: Diagnosis not present

## 2018-01-22 DIAGNOSIS — I10 Essential (primary) hypertension: Secondary | ICD-10-CM | POA: Diagnosis not present

## 2018-01-22 DIAGNOSIS — M545 Low back pain: Secondary | ICD-10-CM | POA: Diagnosis not present

## 2018-01-22 DIAGNOSIS — Z0000 Encounter for general adult medical examination without abnormal findings: Secondary | ICD-10-CM | POA: Diagnosis not present

## 2018-01-22 DIAGNOSIS — G4733 Obstructive sleep apnea (adult) (pediatric): Secondary | ICD-10-CM | POA: Diagnosis not present

## 2018-01-22 DIAGNOSIS — M810 Age-related osteoporosis without current pathological fracture: Secondary | ICD-10-CM | POA: Diagnosis not present

## 2018-01-22 DIAGNOSIS — Z6827 Body mass index (BMI) 27.0-27.9, adult: Secondary | ICD-10-CM | POA: Diagnosis not present

## 2018-01-22 DIAGNOSIS — L658 Other specified nonscarring hair loss: Secondary | ICD-10-CM | POA: Diagnosis not present

## 2018-01-22 DIAGNOSIS — Z86718 Personal history of other venous thrombosis and embolism: Secondary | ICD-10-CM | POA: Diagnosis not present

## 2018-01-22 DIAGNOSIS — M5380 Other specified dorsopathies, site unspecified: Secondary | ICD-10-CM | POA: Diagnosis not present

## 2018-01-22 DIAGNOSIS — M419 Scoliosis, unspecified: Secondary | ICD-10-CM | POA: Diagnosis not present

## 2018-01-22 DIAGNOSIS — Z1389 Encounter for screening for other disorder: Secondary | ICD-10-CM | POA: Diagnosis not present

## 2018-01-22 DIAGNOSIS — R208 Other disturbances of skin sensation: Secondary | ICD-10-CM | POA: Diagnosis not present

## 2018-03-26 ENCOUNTER — Encounter: Payer: Self-pay | Admitting: Nurse Practitioner

## 2018-03-27 NOTE — Progress Notes (Signed)
[facility]  PATIENT: Stacy Parker DOB: 21-May-1934   REASON FOR VISIT: Follow-up for obstructive sleep initial CPAP after new machine HISTORY FROM:patient    HISTORY OF PRESENT ILLNESS:UPDATE 5/22/2019CM Stacy Parker, 84 year old female returns for follow-up after receiving a new CPAP machine.  She currently has a Medical laboratory scientific officer.  And she has a leak.  Compliance data dated 01/26/2018-03/26/2018 shows greater than 4 hours at 46 days or 77%.  Usage days 49 days at 82%.  Average usage 7 hours 39 minutes.  Set pressure 5 to 15 cm EPR 3.  AHI 0.3 ESS 2.  She returns for reevaluation   1/9/19CDCatherine A Parker is a 84 y.o. female , seen here as  referral/ revisit  from Dr. Reynaldo Minium , needing a new BiPAP machine.  Stacy Parker has been an established patient in our practice but I have not seen her in the last 3 years.  She has used positive airway pressure for at least 5 years.  She is still very active going to the [facility], she stopped her citalopram but then went back on after she became a little emotional/ irritable. She also carries a diagnosis of DVT which she developed after back surgery in March 2010, hypertension, GERD, scoliosis, osteoarthritis, she had a thyroid nodule followed at [facility], sacroiliac plasty of August 2010 left hip replacement 2007 right hip replacement 2018 no stimulator implant 2011 and retraction 2017.  Do not have access to her original sleep study right now I will ask my nurse to find it later however the patient was placed on ASV because of complex sleep apnea, she has a setting of a minimum pressure support of 3, maximum pressure support of 10 cmH2O was 6 cm CPAP her residual AHI is 0.3 and her compliance is excellent between 87 and 93 %/months.  Average use of time is 8 hours and 9 minutes.  Chief complaint according to patient : "I need a new machine - and mine is 82 years old. "  Sleep habits are as follows: He takes 5 mg melatonin  before sleep onset. She goes to bed between 10 and 11 usually falls asleep within 30 minutes, she sleeps supine,  On one pillow for neck support. She has usually one bathroom break at night, and she can otherwise sleep through until 6:30 AM when she rises.  She wakes up spontaneously without an alarm.  She reports vivid and adventurous dreams, no nightmares. She feels that there is no difference since she takes melatonin. She reports neither nausea, palpitation, diaphoresis, dry mouth or headaches in the morning.  She feels well rested and refreshed.    REVIEW OF SYSTEMS: Full 14 system review of systems performed and notable only for those listed, all others are neg:  Constitutional: neg  Cardiovascular: neg Ear/Nose/Throat: neg  Skin: neg Eyes: neg Respiratory: neg Gastroitestinal: neg  Hematology/Lymphatic: neg  Endocrine: neg Musculoskeletal:neg Allergy/Immunology: neg Neurological: neg Psychiatric: neg Sleep : Obstructive sleep apnea with CPAP   ALLERGIES: Allergies  Allergen Reactions  . Ace Inhibitors Other (See Comments)    Unsure of what reaction was  . Alendronate Sodium   . Ambien  [Zolpidem Tartrate] Diarrhea  . Boniva [Ibandronic Acid] Other (See Comments)    Flu like symptoms STATES ALL THE OSTEOPOROSIS DRUGS HAVE CAUSED FLU LIKE SYMPTOMS   . Ceftin  [Cefuroxime Axetil] Diarrhea and Swelling  . Cymbalta [Duloxetine Hcl] Nausea Only  . Dicloxacillin Diarrhea    Has patient had a PCN  reaction causing immediate rash, facial/tongue/throat swelling, SOB or lightheadedness with hypotension:unsure Has patient had a PCN reaction causing severe rash involving mucus membranes or skin necrosis:unsure Has patient had a PCN reaction that required hospitalization:NO Has patient had a PCN reaction occurring within the last 10 years:unsure If all of the above answers are "NO", then may proceed with Cephalosporin use.   Marland Kitchen Doxycycline Other (See Comments) and Nausea Only     unknown  . Metronidazole Nausea And Vomiting  . Phenergan [Promethazine Hcl] Other (See Comments)    Other Reaction: CNS Disorder Cough/nausea/sweating   . Succinylcholine Other (See Comments)    Muscle soreness all over body post operatively ("felt like I was hit by [facility] truck")    HOME MEDICATIONS: Outpatient Medications Prior to Visit  Medication Sig Dispense Refill  . citalopram (CELEXA) 40 MG tablet Take 40 mg by mouth daily.    Marland Kitchen docusate sodium (COLACE) 100 MG capsule Take 100 mg by mouth 2 (two) times daily.    Marland Kitchen losartan-hydrochlorothiazide (HYZAAR) 100-25 MG tablet losartan 100 mg-hydrochlorothiazide 25 mg tablet  Take 1 tablet every day by oral route.    . Magnesium 250 MG TABS Take 250 mg by mouth daily.    . Melatonin 5 MG TABS Take 5 mg by mouth at bedtime.     . methocarbamol (ROBAXIN) 500 MG tablet Take 500 mg by mouth daily as needed (for muscle spasm/cramps (RARELY USES)).     . Probiotic Product (ALIGN PO) Take 4 mg by mouth every evening. ONE DAILY    . QUININE SULFATE PO Take 300 mg by mouth at bedtime. For cramps     . Sodium Chloride-Xylitol (XLEAR SINUS CARE SPRAY NA) Place 2 sprays into the nose at bedtime.    . pantoprazole (PROTONIX) 40 MG tablet Take 40 mg by mouth daily.     Marland Kitchen amLODipine (NORVASC) 5 MG tablet Take 5 mg by mouth every evening.   4   No facility-administered medications prior to visit.     PAST MEDICAL HISTORY: Past Medical History:  Diagnosis Date  . Anxiety   . Arthritis   . Basal cell carcinoma   . Cervical arthritis    PT STATES CERVICAL SPINE BADLY DEGENERATED--SHE DOES NOT HAVE NECK PAIN -BUT TOLD HER NECK IS FRAGILE  . Complication of anesthesia    myalgia   . Diverticulosis   . DVT (deep venous thrombosis) (HCC)    Left leg  . GERD (gastroesophageal reflux disease)   . H/O hiatal hernia    repairsed  . HTN (hypertension)   . Neuropathy of foot, left   . OSA on CPAP   . Osteopenia   . Osteoporosis   . Reflux   .  Sciatic nerve disease   . Sciatic pain    RIGHT LEG / BACK PAIN- PT ON OXYCONTIN 3 TIMES A DAY--STATES MUST CONTINUE WHILE IN HOSPITAL TO AVOID WITHDRAWAL  . Sleep apnea 04/12/2012   cpap  . Squamous carcinoma     PAST SURGICAL HISTORY: Past Surgical History:  Procedure Laterality Date  . BREAST SURGERY     Fibroma, right  . HERNIA REPAIR    . HIATAL HERNIA REPAIR  04/17/2012   Procedure: LAPAROSCOPIC REPAIR OF HIATAL HERNIA;  Surgeon: Pedro Earls, MD;  Location: [facility];  Service: General;  Laterality: N/A;  Large Hiatus Hernia  . INTRAOPERATIVE ARTERIOGRAM    . neuro stimulator     THORACIC AREA-BATTERY IN RIGHT HIP PT TURNS OFF AND  ON WITH MAGNET-PT STATES SHE IS PAIN FREE WHEN LYING DOWN -BUT WHEN SITTING OR STANDING SHE EXPERIENCES TINGLING AND PAIN DOWN RIGHT LEG-SCIATIC PAIN  . SPINAL FUSION     8 level  LUMBAR/THORACIC  . TOTAL HIP ARTHROPLASTY     left  . TOTAL HIP ARTHROPLASTY Right 03/29/2017   Procedure: RIGHT TOTAL HIP ARTHROPLASTY ANTERIOR APPROACH;  Surgeon: Gaynelle Arabian, MD;  Location: [facility];  Service: Orthopedics;  Laterality: Right;  . TOTAL VAGINAL HYSTERECTOMY  1993  . TUBAL LIGATION    . verteboplasty      FAMILY HISTORY: Family History  Problem Relation Age of Onset  . Hypertension Mother   . COPD Mother   . Heart disease Father   . Hypertension Father   . Skin cancer Father   . Breast cancer Maternal Grandmother     SOCIAL HISTORY: Social History   Socioeconomic History  . Marital status: Married    Spouse name: Not on file  . Number of children: 2  . Years of education: Not on file  . Highest education level: Not on file  Occupational History  . Occupation: retired    Fish farm manager: RETIRED  Social Needs  . Financial resource strain: Not on file  . Food insecurity:    Worry: Not on file    Inability: Not on file  . Transportation needs:    Medical: Not on file    Non-medical: Not on file  Tobacco Use  . Smoking status: Former Smoker      Types: Cigarettes  . Smokeless tobacco: Never Used  . Tobacco comment: QUIT SMOKING IN HER EARLY 40'S  AND WAS NEVER A HEAVY SMOKER  Substance and Sexual Activity  . Alcohol use: Yes    Alcohol/week: 0.0 oz    Comment: rarely  . Drug use: No  . Sexual activity: Never    Birth control/protection: Surgical    Comment: DECLINED INSURCNACE QUESTIONS  Lifestyle  . Physical activity:    Days per week: Not on file    Minutes per session: Not on file  . Stress: Not on file  Relationships  . Social connections:    Talks on phone: Not on file    Gets together: Not on file    Attends religious service: Not on file    Active member of club or organization: Not on file    Attends meetings of clubs or organizations: Not on file    Relationship status: Not on file  . Intimate partner violence:    Fear of current or ex partner: Not on file    Emotionally abused: Not on file    Physically abused: Not on file    Forced sexual activity: Not on file  Other Topics Concern  . Not on file  Social History Narrative  . Not on file     PHYSICAL EXAM  Vitals:   03/28/18 1334  BP: (!) 93/56  Pulse: 75  Weight: 155 lb 6.4 oz (70.5 kg)  Height: 5\' 3"  (1.6 m)   Body mass index is 27.53 kg/m.  Generalized: Well developed, in no acute distress  Head: normocephalic and atraumatic,. Oropharynx benign malllopati 2-3 Neck: Supple, circumference 15 Musculoskeletal: No deformity   Neurological examination   Mentation: Alert oriented to time, place, history taking. Attention span and concentration appropriate. Recent and remote memory intact.  Follows all commands speech and language fluent.   Cranial nerve II-XII: Pupils were equal round reactive to light extraocular movements were full, visual field  were full on confrontational test. Facial sensation and strength were normal. hearing was intact to finger rubbing bilaterally. Uvula tongue midline. head turning and shoulder shrug were normal and  symmetric.Tongue protrusion into cheek strength was normal. Motor: normal bulk and tone, full strength in the BUE, BLE, Sensory: normal and symmetric to light touch,  Coordination: finger-nose-finger, heel-to-shin bilaterally, no dysmetria Gait and Station: Rising up from seated position without assistance, normal stance,  moderate stride, good arm swing, smooth turning, able to perform tiptoe, and heel walking without difficulty. Tandem gait is steady  DIAGNOSTIC DATA (LABS, IMAGING, TESTING) - I reviewed patient records, labs, notes, testing and imaging myself where available.  Lab Results  Component Value Date   WBC 16.5 (H) 03/31/2017   HGB 10.4 (L) 03/31/2017   HCT 31.4 (L) 03/31/2017   MCV 97.2 03/31/2017   PLT 188 03/31/2017      Component Value Date/Time   NA 139 03/31/2017 0414   K 4.5 03/31/2017 0414   CL 105 03/31/2017 0414   CO2 28 03/31/2017 0414   GLUCOSE 112 (H) 03/31/2017 0414   BUN 15 03/31/2017 0414   CREATININE 0.94 03/31/2017 0414   CALCIUM 9.1 03/31/2017 0414   PROT 6.9 03/22/2017 1440   ALBUMIN 4.1 03/22/2017 1440   AST 26 03/22/2017 1440   ALT 16 03/22/2017 1440   ALKPHOS 73 03/22/2017 1440   BILITOT 0.6 03/22/2017 1440   GFRNONAA 56 (L) 03/31/2017 0414   GFRAA >60 03/31/2017 0414    ASSESSMENT AND PLAN  84 y.o. year old female  has a past medical history of Anxiety, Arthritis, Basal cell carcinoma, Cervical arthritis, Complication of anesthesia, Diverticulosis, DVT (deep venous thrombosis) ([place]), GERD (gastroesophageal reflux disease), H/O hiatal hernia, HTN (hypertension), Neuropathy of foot, left, OSA on CPAP, Osteopenia, Osteoporosis, Reflux, Sciatic nerve disease, Sciatic pain, Sleep apnea (04/12/2012), and Squamous carcinoma. here to follow-up after receiving new CPAP machine for initial compliance.Data dated 01/26/2018-03/26/2018 shows greater than 4 hours at 46 days or 77%.  Usage days 49 days at 82%.  Average usage 7 hours 39 minutes.  Set pressure 5  to 15 cm EPR 3.  AHI 0.3 ESS 2.    CPAP compliance 77% Need mask refit Continue same settings F/U 6 months Dennie Bible, [facility], [facility], APRN  [facility] [address], [place], [place] [address] [phone]

## 2018-03-28 ENCOUNTER — Encounter: Payer: Self-pay | Admitting: Nurse Practitioner

## 2018-03-28 ENCOUNTER — Ambulatory Visit (INDEPENDENT_AMBULATORY_CARE_PROVIDER_SITE_OTHER): Payer: Medicare Other | Admitting: Nurse Practitioner

## 2018-03-28 DIAGNOSIS — G4733 Obstructive sleep apnea (adult) (pediatric): Secondary | ICD-10-CM | POA: Diagnosis not present

## 2018-03-28 DIAGNOSIS — Z9989 Dependence on other enabling machines and devices: Secondary | ICD-10-CM | POA: Diagnosis not present

## 2018-03-28 NOTE — Patient Instructions (Signed)
CPAP compliance 77% Need mask refit Continue same settings F/U 6 months

## 2018-03-30 NOTE — Progress Notes (Signed)
I agree with the assessment and plan as directed by NP .The patient is known to me .   Janyia Guion, MD  

## 2018-04-06 NOTE — Progress Notes (Signed)
I agree with the assessment and plan as directed by NP .The patient is known to me .   Robertta Halfhill, MD  

## 2018-04-10 DIAGNOSIS — H35372 Puckering of macula, left eye: Secondary | ICD-10-CM | POA: Diagnosis not present

## 2018-04-10 DIAGNOSIS — H353131 Nonexudative age-related macular degeneration, bilateral, early dry stage: Secondary | ICD-10-CM | POA: Diagnosis not present

## 2018-04-10 DIAGNOSIS — H35362 Drusen (degenerative) of macula, left eye: Secondary | ICD-10-CM | POA: Diagnosis not present

## 2018-04-10 DIAGNOSIS — G4733 Obstructive sleep apnea (adult) (pediatric): Secondary | ICD-10-CM | POA: Diagnosis not present

## 2018-04-19 DIAGNOSIS — R350 Frequency of micturition: Secondary | ICD-10-CM | POA: Diagnosis not present

## 2018-04-19 DIAGNOSIS — N390 Urinary tract infection, site not specified: Secondary | ICD-10-CM | POA: Diagnosis not present

## 2018-04-25 DIAGNOSIS — Z85828 Personal history of other malignant neoplasm of skin: Secondary | ICD-10-CM | POA: Diagnosis not present

## 2018-04-25 DIAGNOSIS — B372 Candidiasis of skin and nail: Secondary | ICD-10-CM | POA: Diagnosis not present

## 2018-04-25 DIAGNOSIS — L570 Actinic keratosis: Secondary | ICD-10-CM | POA: Diagnosis not present

## 2018-04-25 DIAGNOSIS — D225 Melanocytic nevi of trunk: Secondary | ICD-10-CM | POA: Diagnosis not present

## 2018-04-25 DIAGNOSIS — D3612 Benign neoplasm of peripheral nerves and autonomic nervous system, upper limb, including shoulder: Secondary | ICD-10-CM | POA: Diagnosis not present

## 2018-04-25 DIAGNOSIS — L814 Other melanin hyperpigmentation: Secondary | ICD-10-CM | POA: Diagnosis not present

## 2018-04-25 DIAGNOSIS — L821 Other seborrheic keratosis: Secondary | ICD-10-CM | POA: Diagnosis not present

## 2018-05-07 DIAGNOSIS — Z96641 Presence of right artificial hip joint: Secondary | ICD-10-CM | POA: Diagnosis not present

## 2018-05-09 DIAGNOSIS — R300 Dysuria: Secondary | ICD-10-CM | POA: Diagnosis not present

## 2018-05-15 ENCOUNTER — Other Ambulatory Visit: Payer: Self-pay | Admitting: Surgical

## 2018-05-15 DIAGNOSIS — Z96641 Presence of right artificial hip joint: Secondary | ICD-10-CM

## 2018-05-16 ENCOUNTER — Ambulatory Visit
Admission: RE | Admit: 2018-05-16 | Discharge: 2018-05-16 | Disposition: A | Discharge status: Home / Self Care | Payer: Medicare Other | Source: Ambulatory Visit | Attending: Surgical | Admitting: Surgical

## 2018-05-16 DIAGNOSIS — Z96641 Presence of right artificial hip joint: Secondary | ICD-10-CM

## 2018-05-16 DIAGNOSIS — M25551 Pain in right hip: Secondary | ICD-10-CM | POA: Diagnosis not present

## 2018-05-31 DIAGNOSIS — M1611 Unilateral primary osteoarthritis, right hip: Secondary | ICD-10-CM | POA: Diagnosis not present

## 2018-06-18 DIAGNOSIS — M62838 Other muscle spasm: Secondary | ICD-10-CM | POA: Diagnosis not present

## 2018-06-18 DIAGNOSIS — M6281 Muscle weakness (generalized): Secondary | ICD-10-CM | POA: Diagnosis not present

## 2018-06-18 DIAGNOSIS — M25551 Pain in right hip: Secondary | ICD-10-CM | POA: Diagnosis not present

## 2018-06-21 DIAGNOSIS — Z961 Presence of intraocular lens: Secondary | ICD-10-CM | POA: Diagnosis not present

## 2018-06-21 DIAGNOSIS — H353121 Nonexudative age-related macular degeneration, left eye, early dry stage: Secondary | ICD-10-CM | POA: Diagnosis not present

## 2018-06-21 DIAGNOSIS — H35361 Drusen (degenerative) of macula, right eye: Secondary | ICD-10-CM | POA: Diagnosis not present

## 2018-06-21 DIAGNOSIS — H02411 Mechanical ptosis of right eyelid: Secondary | ICD-10-CM | POA: Diagnosis not present

## 2018-06-21 DIAGNOSIS — H52223 Regular astigmatism, bilateral: Secondary | ICD-10-CM | POA: Diagnosis not present

## 2018-06-21 DIAGNOSIS — H35372 Puckering of macula, left eye: Secondary | ICD-10-CM | POA: Diagnosis not present

## 2018-06-21 DIAGNOSIS — H5213 Myopia, bilateral: Secondary | ICD-10-CM | POA: Diagnosis not present

## 2018-07-11 DIAGNOSIS — M6281 Muscle weakness (generalized): Secondary | ICD-10-CM | POA: Diagnosis not present

## 2018-07-11 DIAGNOSIS — M62838 Other muscle spasm: Secondary | ICD-10-CM | POA: Diagnosis not present

## 2018-07-11 DIAGNOSIS — M25551 Pain in right hip: Secondary | ICD-10-CM | POA: Diagnosis not present

## 2018-07-12 DIAGNOSIS — Z85828 Personal history of other malignant neoplasm of skin: Secondary | ICD-10-CM | POA: Diagnosis not present

## 2018-07-12 DIAGNOSIS — D485 Neoplasm of uncertain behavior of skin: Secondary | ICD-10-CM | POA: Diagnosis not present

## 2018-07-12 DIAGNOSIS — L570 Actinic keratosis: Secondary | ICD-10-CM | POA: Diagnosis not present

## 2018-07-12 DIAGNOSIS — L988 Other specified disorders of the skin and subcutaneous tissue: Secondary | ICD-10-CM | POA: Diagnosis not present

## 2018-07-12 DIAGNOSIS — L820 Inflamed seborrheic keratosis: Secondary | ICD-10-CM | POA: Diagnosis not present

## 2018-07-12 DIAGNOSIS — L821 Other seborrheic keratosis: Secondary | ICD-10-CM | POA: Diagnosis not present

## 2018-07-25 DIAGNOSIS — M25551 Pain in right hip: Secondary | ICD-10-CM | POA: Diagnosis not present

## 2018-07-25 DIAGNOSIS — M6281 Muscle weakness (generalized): Secondary | ICD-10-CM | POA: Diagnosis not present

## 2018-07-25 DIAGNOSIS — M62838 Other muscle spasm: Secondary | ICD-10-CM | POA: Diagnosis not present

## 2018-08-11 DIAGNOSIS — Z23 Encounter for immunization: Secondary | ICD-10-CM | POA: Diagnosis not present

## 2018-08-27 DIAGNOSIS — M62838 Other muscle spasm: Secondary | ICD-10-CM | POA: Diagnosis not present

## 2018-08-27 DIAGNOSIS — M25551 Pain in right hip: Secondary | ICD-10-CM | POA: Diagnosis not present

## 2018-08-27 DIAGNOSIS — M6281 Muscle weakness (generalized): Secondary | ICD-10-CM | POA: Diagnosis not present

## 2018-09-24 DIAGNOSIS — M25551 Pain in right hip: Secondary | ICD-10-CM | POA: Diagnosis not present

## 2018-09-24 DIAGNOSIS — M6281 Muscle weakness (generalized): Secondary | ICD-10-CM | POA: Diagnosis not present

## 2018-09-24 DIAGNOSIS — M62838 Other muscle spasm: Secondary | ICD-10-CM | POA: Diagnosis not present

## 2018-09-30 IMAGING — RF DG UGI W/ KUB
8 of 10 series · 14 of 24 positions shown · non-contrast
Comparison: 06/30/2015

CLINICAL DATA: Possible recurrent hiatal hernia.  Down

EXAM:
UPPER GI SERIES WITH KUB
TECHNIQUE: After obtaining a scout radiograph a routine upper GI series was
performed using thin and high density barium.
FLUOROSCOPY TIME:  Fluoroscopy Time:  3 minutes and 48 seconds.
Radiation Exposure Index (if provided by the fluoroscopic device):
317 mGy
Number of Acquired Spot Images: 0

[Series 1: one shot · 1 of 1 slices shown (1 of 2)]
[im 1/1]
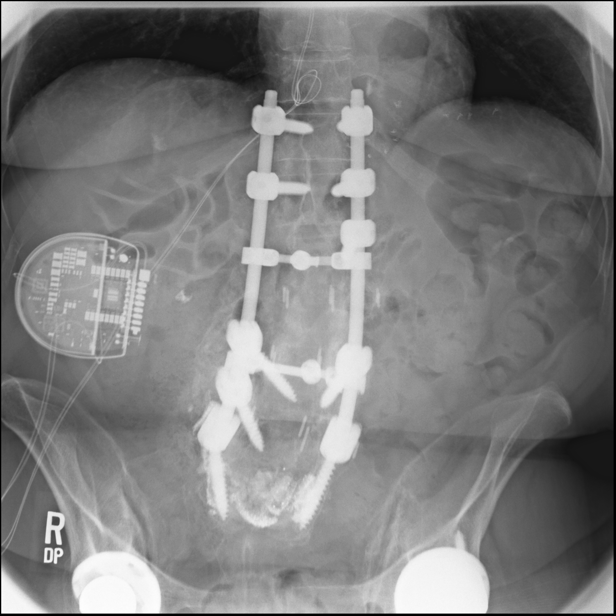

[Series 2: sequence · 1 of 10 frames shown (1 of 6)]
[frame 9/10]
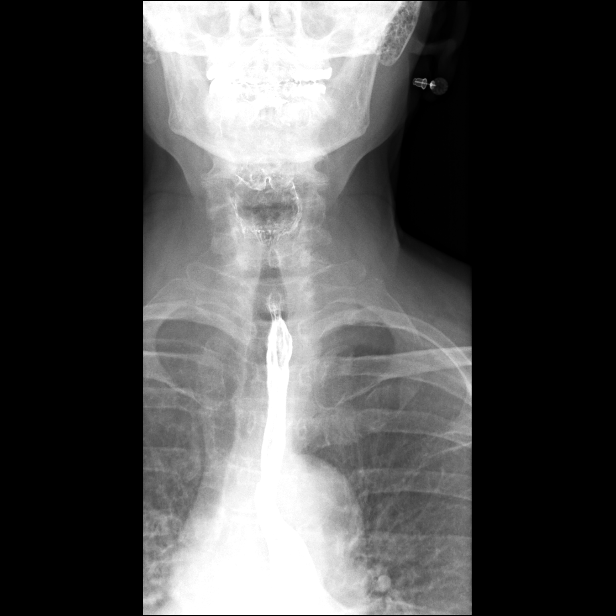

[Series 3: sequence · 1 of 9 frames shown (2 of 6)]
[frame 6/9]
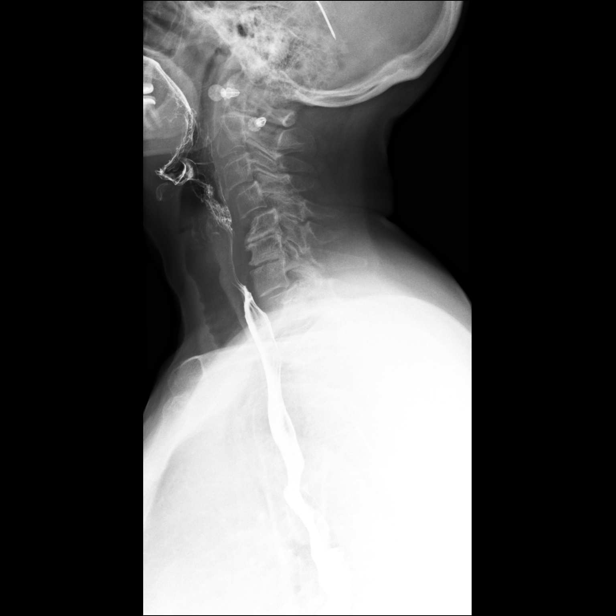

[Series 5: one shot · 4 of 8 slices shown (2 of 2)]
[im 1/8]
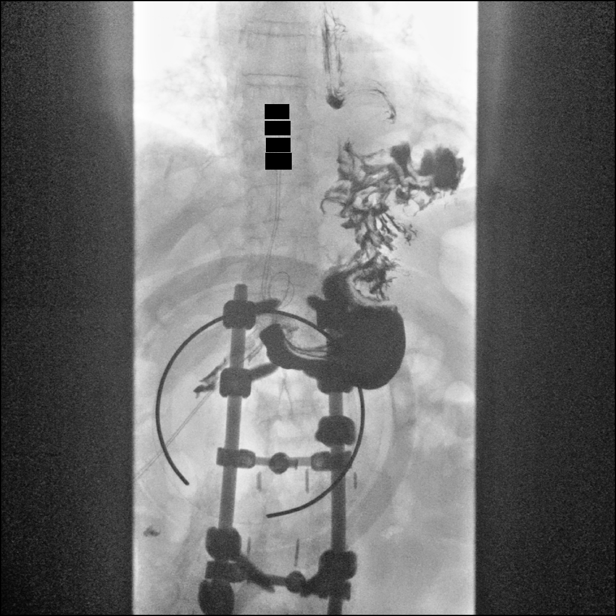
[im 2/8]
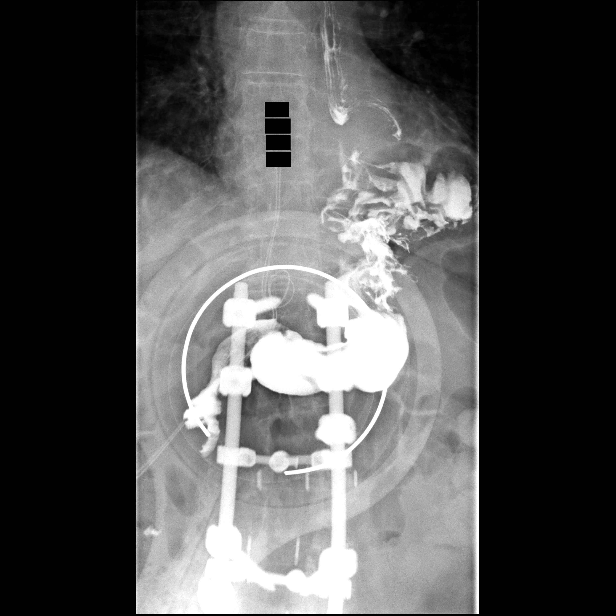
[im 5/8]
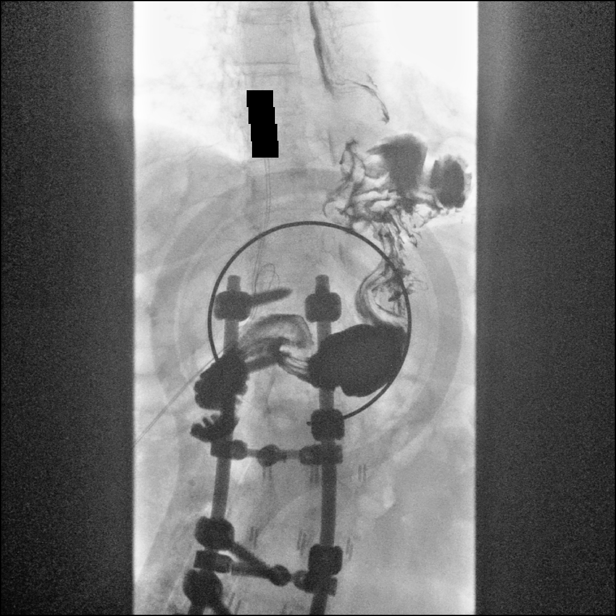
[im 8/8]
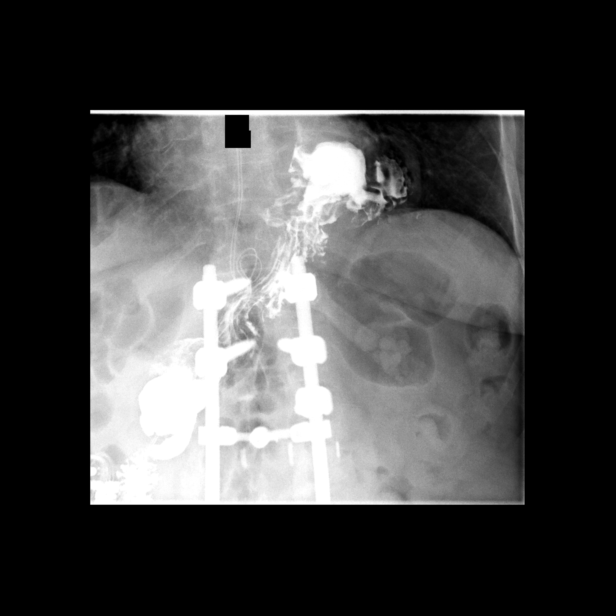

[Series 6: sequence · 1 of 26 frames shown (3 of 6)]
[frame 14/26]
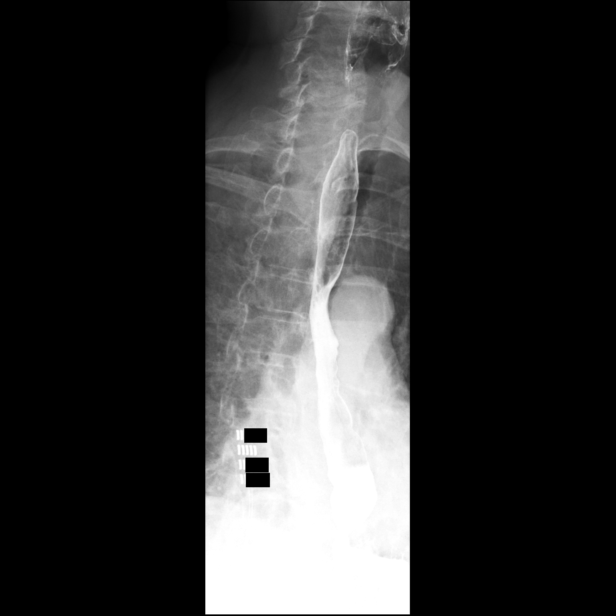

[Series 7: sequence · 2 of 19 frames shown (4 of 6)]
[frame 3/19]
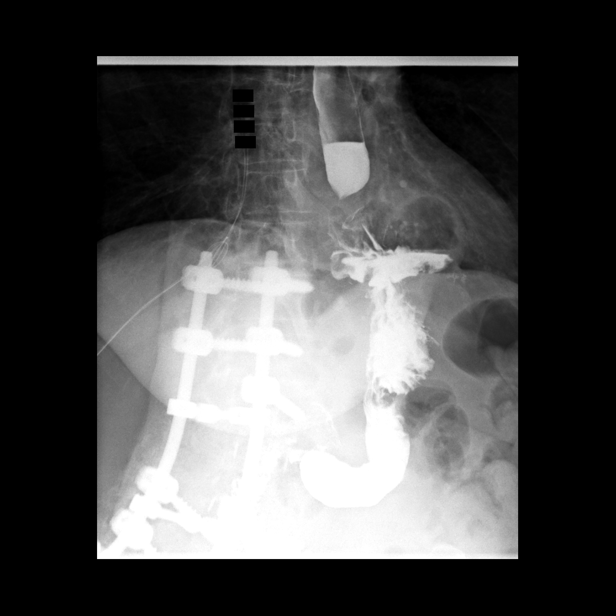
[frame 17/19]
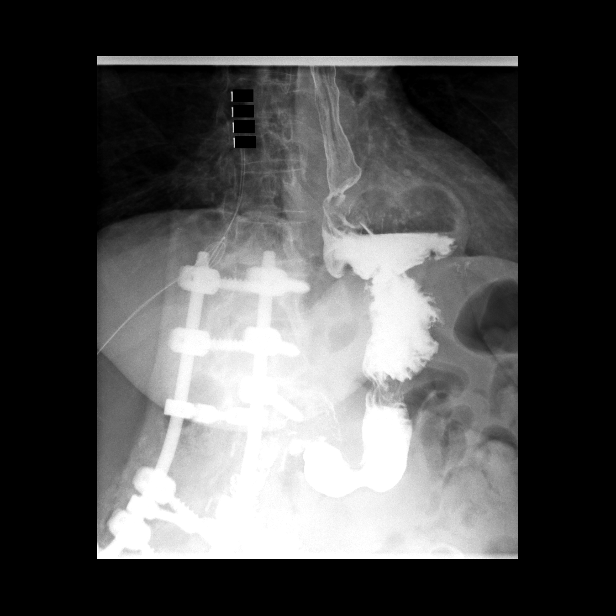

[Series 9: sequence · 2 of 32 frames shown (5 of 6)]
[frame 10/32]
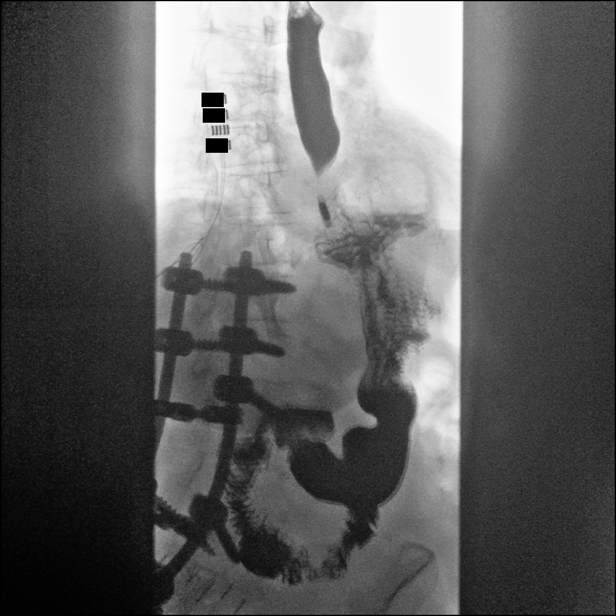
[frame 17/32]
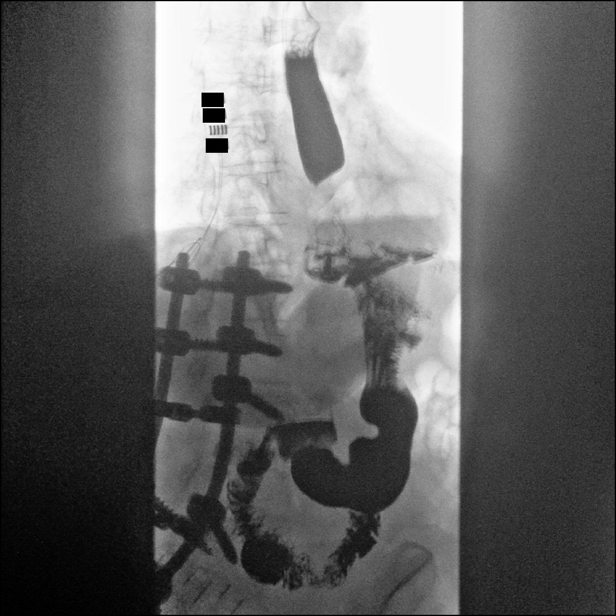

[Series 11: sequence · 2 of 31 frames shown (6 of 6)]
[frame 5/31]
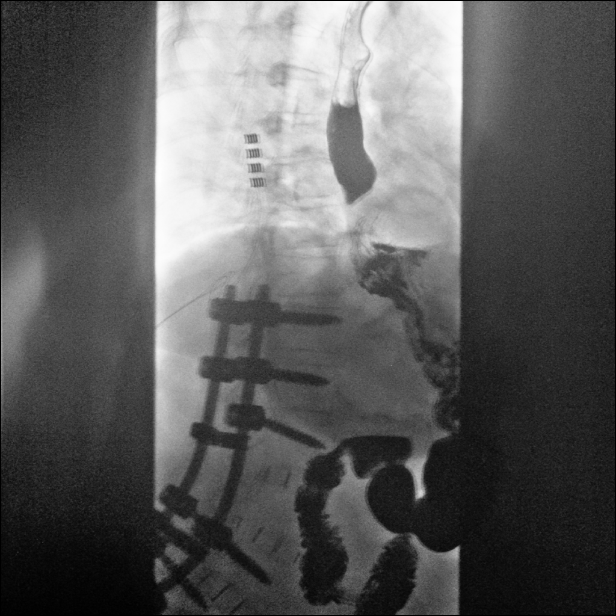
[frame 31/31]
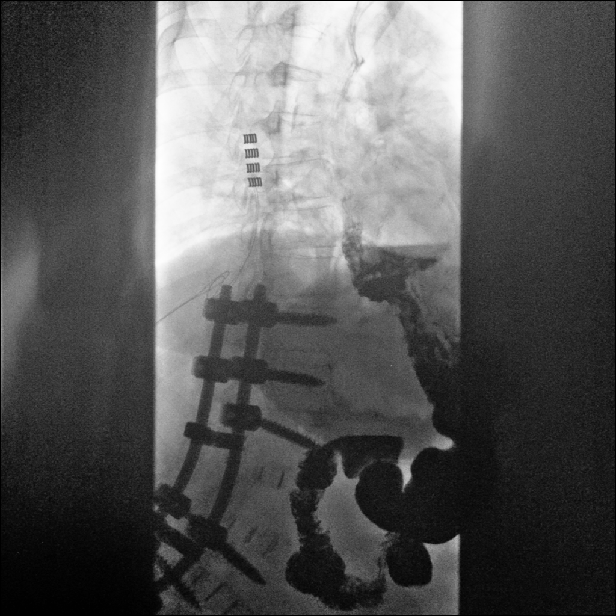

[14 of 24 positions shown; findings below may reference images not displayed]

FINDINGS: Given relatively recent right hip replacement, single contrast study
was performed.

Frontal KUB shows unremarkable bowel gas pattern. Patient is status
post extensive thoracolumbar fusion. Spinal stimulator device noted.
Status post bilateral hip replacement.

Frontal and lateral views of the hypopharynx while swallowing are
normal.

Esophagus has normal single contrast imaging appearance. Patient is
noted to have a small, sliding-type hiatal hernia. Imaging
appearance is relatively stable comparing to the study from two
years ago. Gastric emptying is prompt through a normal appearing
pylorus. Duodenum bulb is unremarkable. Duodenal C-loop is normally
positioned as is the ligament of Treitz.

13 mm barium tablet passes into the hiatal hernia when taken with
water.
IMPRESSION: Small to moderate sliding type hiatal hernia, similar in appearance
to the study from 3 years ago. The size of the hernia was
qualitatively described as moderate to large on the prior study but
on that study and on today's exam only about 20% of the stomach
projects above the diaphragmatic hiatus with the proportion of
herniated stomach being similar on the 2 exams.

## 2018-10-03 NOTE — Progress Notes (Signed)
[facility]  PATIENT: Stacy Parker DOB: 12-22-1933   REASON FOR VISIT: Follow-up for obstructive sleep apnea  CPAP compliance with  new machine HISTORY FROM:patient    HISTORY OF PRESENT ILLNESSUPDATE 12/2/2019CM Stacy Parker, 84 year old female returns for follow-up with history of obstructive sleep apnea here for CPAP compliance.  She has no issues with her machine and has been on CPAP for years.  Data dated 09/07/2018-10/06/2018 shows compliance greater than 4 hours at 90%.  Average usage 7 hours 59 minutes.  Set pressure 5 to 15 cm.  AHI 2 ESS 1 she returns for reevaluation     :UPDATE 5/22/2019CM Stacy Parker, 84 year old female returns for follow-up after receiving a new CPAP machine.  She currently has a Medical laboratory scientific officer.  And she has a leak.  Compliance data dated 01/26/2018-03/26/2018 shows greater than 4 hours at 46 days or 77%.  Usage days 49 days at 82%.  Average usage 7 hours 39 minutes.  Set pressure 5 to 15 cm EPR 3.  AHI 0.3 ESS 2.  She returns for reevaluation   1/9/19CDCatherine A Parker is a 84 y.o. female , seen here as  referral/ revisit  from Dr. Reynaldo Minium , needing a new BiPAP machine.  Stacy Parker has been an established patient in our practice but I have not seen her in the last 3 years.  She has used positive airway pressure for at least 5 years.  She is still very active going to the [facility], she stopped her citalopram but then went back on after she became a little emotional/ irritable. She also carries a diagnosis of DVT which she developed after back surgery in March 2010, hypertension, GERD, scoliosis, osteoarthritis, she had a thyroid nodule followed at [facility], sacroiliac plasty of August 2010 left hip replacement 2007 right hip replacement 2018 no stimulator implant 2011 and retraction 2017.  Do not have access to her original sleep study right now I will ask my nurse to find it later however the patient was placed on ASV because of  complex sleep apnea, she has a setting of a minimum pressure support of 3, maximum pressure support of 10 cmH2O was 6 cm CPAP her residual AHI is 0.3 and her compliance is excellent between 87 and 93 %/months.  Average use of time is 8 hours and 9 minutes.  Chief complaint according to patient : "I need a new machine - and mine is 84 years old. "  Sleep habits are as follows: He takes 5 mg melatonin before sleep onset. She goes to bed between 10 and 11 usually falls asleep within 30 minutes, she sleeps supine,  On one pillow for neck support. She has usually one bathroom break at night, and she can otherwise sleep through until 6:30 AM when she rises.  She wakes up spontaneously without an alarm.  She reports vivid and adventurous dreams, no nightmares. She feels that there is no difference since she takes melatonin. She reports neither nausea, palpitation, diaphoresis, dry mouth or headaches in the morning.  She feels well rested and refreshed.    REVIEW OF SYSTEMS: Full 14 system review of systems performed and notable only for those listed, all others are neg:  Constitutional: neg  Cardiovascular: neg Ear/Nose/Throat: neg  Skin: neg Eyes: neg Respiratory: neg Gastroitestinal: neg  Hematology/Lymphatic: neg  Endocrine: neg Musculoskeletal:neg Allergy/Immunology: neg Neurological: neg Psychiatric: neg Sleep : Obstructive sleep apnea with CPAP   ALLERGIES: Allergies  Allergen Reactions  . Ace Inhibitors  Other (See Comments)    Unsure of what reaction was  . Alendronate Sodium   . Ambien  [Zolpidem Tartrate] Diarrhea  . Boniva [Ibandronic Acid] Other (See Comments)    Flu like symptoms STATES ALL THE OSTEOPOROSIS DRUGS HAVE CAUSED FLU LIKE SYMPTOMS   . Ceftin  [Cefuroxime Axetil] Diarrhea and Swelling  . Cymbalta [Duloxetine Hcl] Nausea Only  . Dicloxacillin Diarrhea    Has patient had a PCN reaction causing immediate rash, facial/tongue/throat swelling, SOB or  lightheadedness with hypotension:unsure Has patient had a PCN reaction causing severe rash involving mucus membranes or skin necrosis:unsure Has patient had a PCN reaction that required hospitalization:NO Has patient had a PCN reaction occurring within the last 10 years:unsure If all of the above answers are "NO", then may proceed with Cephalosporin use.   Marland Kitchen Doxycycline Other (See Comments) and Nausea Only    unknown  . Metronidazole Nausea And Vomiting  . Phenergan [Promethazine Hcl] Other (See Comments)    Other Reaction: CNS Disorder Cough/nausea/sweating   . Succinylcholine Other (See Comments)    Muscle soreness all over body post operatively ("felt like I was hit by [facility] truck")    HOME MEDICATIONS: Outpatient Medications Prior to Visit  Medication Sig Dispense Refill  . citalopram (CELEXA) 40 MG tablet Take 40 mg by mouth daily.    Marland Kitchen docusate sodium (COLACE) 100 MG capsule Take 100 mg by mouth 2 (two) times daily.    Marland Kitchen losartan-hydrochlorothiazide (HYZAAR) 100-25 MG tablet losartan 100 mg-hydrochlorothiazide 25 mg tablet  Take 1 tablet every day by oral route.    . Magnesium 250 MG TABS Take 250 mg by mouth daily.    . Melatonin 5 MG TABS Take 5 mg by mouth at bedtime.     . methocarbamol (ROBAXIN) 500 MG tablet Take 500 mg by mouth daily as needed (for muscle spasm/cramps (RARELY USES)).     . Probiotic Product (ALIGN PO) Take 4 mg by mouth every evening. ONE DAILY    . QUININE SULFATE PO Take 300 mg by mouth at bedtime. For cramps     . Sodium Chloride-Xylitol (XLEAR SINUS CARE SPRAY NA) Place 2 sprays into the nose at bedtime.    . pantoprazole (PROTONIX) 40 MG tablet Take 40 mg by mouth daily.      No facility-administered medications prior to visit.     PAST MEDICAL HISTORY: Past Medical History:  Diagnosis Date  . Anxiety   . Arthritis   . Basal cell carcinoma   . Cervical arthritis    PT STATES CERVICAL SPINE BADLY DEGENERATED--SHE DOES NOT HAVE NECK PAIN -BUT  TOLD HER NECK IS FRAGILE  . Complication of anesthesia    myalgia   . Diverticulosis   . DVT (deep venous thrombosis) (HCC)    Left leg  . GERD (gastroesophageal reflux disease)   . H/O hiatal hernia    repairsed  . HTN (hypertension)   . Neuropathy of foot, left   . OSA on CPAP   . Osteopenia   . Osteoporosis   . Reflux   . Sciatic nerve disease   . Sciatic pain    RIGHT LEG / BACK PAIN- PT ON OXYCONTIN 3 TIMES A DAY--STATES MUST CONTINUE WHILE IN HOSPITAL TO AVOID WITHDRAWAL  . Sleep apnea 04/12/2012   cpap  . Squamous carcinoma     PAST SURGICAL HISTORY: Past Surgical History:  Procedure Laterality Date  . BREAST SURGERY     Fibroma, right  . HERNIA REPAIR    .  HIATAL HERNIA REPAIR  04/17/2012   Procedure: LAPAROSCOPIC REPAIR OF HIATAL HERNIA;  Surgeon: Pedro Earls, MD;  Location: [facility];  Service: General;  Laterality: N/A;  Large Hiatus Hernia  . INTRAOPERATIVE ARTERIOGRAM    . neuro stimulator     THORACIC AREA-BATTERY IN RIGHT HIP PT TURNS OFF AND ON WITH MAGNET-PT STATES SHE IS PAIN FREE WHEN LYING DOWN -BUT WHEN SITTING OR STANDING SHE EXPERIENCES TINGLING AND PAIN DOWN RIGHT LEG-SCIATIC PAIN  . SPINAL FUSION     8 level  LUMBAR/THORACIC  . TOTAL HIP ARTHROPLASTY     left  . TOTAL HIP ARTHROPLASTY Right 03/29/2017   Procedure: RIGHT TOTAL HIP ARTHROPLASTY ANTERIOR APPROACH;  Surgeon: Gaynelle Arabian, MD;  Location: [facility];  Service: Orthopedics;  Laterality: Right;  . TOTAL VAGINAL HYSTERECTOMY  1993  . TUBAL LIGATION    . verteboplasty      FAMILY HISTORY: Family History  Problem Relation Age of Onset  . Hypertension Mother   . COPD Mother   . Heart disease Father   . Hypertension Father   . Skin cancer Father   . Breast cancer Maternal Grandmother     SOCIAL HISTORY: Social History   Socioeconomic History  . Marital status: Married    Spouse name: Not on file  . Number of children: 2  . Years of education: Not on file  . Highest education  level: Not on file  Occupational History  . Occupation: retired    Fish farm manager: RETIRED  Social Needs  . Financial resource strain: Not on file  . Food insecurity:    Worry: Not on file    Inability: Not on file  . Transportation needs:    Medical: Not on file    Non-medical: Not on file  Tobacco Use  . Smoking status: Former Smoker    Types: Cigarettes  . Smokeless tobacco: Never Used  . Tobacco comment: QUIT SMOKING IN HER EARLY 40'S  AND WAS NEVER A HEAVY SMOKER  Substance and Sexual Activity  . Alcohol use: Yes    Alcohol/week: 0.0 standard drinks    Comment: rarely  . Drug use: No  . Sexual activity: Never    Birth control/protection: Surgical    Comment: DECLINED INSURCNACE QUESTIONS  Lifestyle  . Physical activity:    Days per week: Not on file    Minutes per session: Not on file  . Stress: Not on file  Relationships  . Social connections:    Talks on phone: Not on file    Gets together: Not on file    Attends religious service: Not on file    Active member of club or organization: Not on file    Attends meetings of clubs or organizations: Not on file    Relationship status: Not on file  . Intimate partner violence:    Fear of current or ex partner: Not on file    Emotionally abused: Not on file    Physically abused: Not on file    Forced sexual activity: Not on file  Other Topics Concern  . Not on file  Social History Narrative  . Not on file     PHYSICAL EXAM  Vitals:   10/08/18 1407  BP: 129/72  Pulse: 82  Weight: 156 lb 12.8 oz (71.1 kg)  Height: 5\' 3"  (1.6 m)   Body mass index is 27.78 kg/m.  Generalized: Well developed, in no acute distress  Head: normocephalic and atraumatic,. Oropharynx benign malllopati 2-3 Neck: Supple,  circumference 15 Musculoskeletal: No deformity   Neurological examination   Mentation: Alert oriented to time, place, history taking. Attention span and concentration appropriate. Recent and remote memory intact.   Follows all commands speech and language fluent.   Cranial nerve II-XII: Pupils were equal round reactive to light extraocular movements were full, visual field were full on confrontational test. Facial sensation and strength were normal. hearing was intact to finger rubbing bilaterally. Uvula tongue midline. head turning and shoulder shrug were normal and symmetric.Tongue protrusion into cheek strength was normal. Motor: normal bulk and tone, full strength in the BUE, BLE, Sensory: normal and symmetric to light touch,  Coordination: finger-nose-finger, heel-to-shin bilaterally, no dysmetria Gait and Station: Rising up from seated position without assistance, normal stance,  moderate stride, good arm swing, smooth turning, able to perform tiptoe, and heel walking without difficulty. Tandem gait is steady  DIAGNOSTIC DATA (LABS, IMAGING, TESTING) - I reviewed patient records, labs, notes, testing and imaging myself where available.  Lab Results  Component Value Date   WBC 16.5 (H) 03/31/2017   HGB 10.4 (L) 03/31/2017   HCT 31.4 (L) 03/31/2017   MCV 97.2 03/31/2017   PLT 188 03/31/2017      Component Value Date/Time   NA 139 03/31/2017 0414   K 4.5 03/31/2017 0414   CL 105 03/31/2017 0414   CO2 28 03/31/2017 0414   GLUCOSE 112 (H) 03/31/2017 0414   BUN 15 03/31/2017 0414   CREATININE 0.94 03/31/2017 0414   CALCIUM 9.1 03/31/2017 0414   PROT 6.9 03/22/2017 1440   ALBUMIN 4.1 03/22/2017 1440   AST 26 03/22/2017 1440   ALT 16 03/22/2017 1440   ALKPHOS 73 03/22/2017 1440   BILITOT 0.6 03/22/2017 1440   GFRNONAA 56 (L) 03/31/2017 0414   GFRAA >60 03/31/2017 0414    ASSESSMENT AND PLAN  84 y.o. year old female  has a past medical history of Anxiety, Arthritis, Basal cell carcinoma, Cervical arthritis, Complication of anesthesia, Diverticulosis, DVT (deep venous thrombosis) ([place]), GERD (gastroesophageal reflux disease), H/O hiatal hernia, HTN (hypertension), Neuropathy of foot, left,  OSA on CPAP, Osteopenia, Osteoporosis, Reflux, Sciatic nerve disease, Sciatic pain, Sleep apnea (04/12/2012), and Squamous carcinoma. here to follow-up after receiving new CPAP machine here for  compliance. Data dated 09/07/2018-10/06/2018 shows compliance greater than 4 hours at 90%.  Average usage 7 hours 59 minutes.  Set pressure 5 to 15 cm.  AHI 2 ESS 1   CPAP compliance 90 % reviewed data with patient Continue same settings F/U 1 year Dennie Bible, [facility], [facility], APRN  [facility] [address] [place], [place] [address] [phone]

## 2018-10-08 ENCOUNTER — Ambulatory Visit (INDEPENDENT_AMBULATORY_CARE_PROVIDER_SITE_OTHER): Payer: Medicare Other | Admitting: Nurse Practitioner

## 2018-10-08 ENCOUNTER — Telehealth: Payer: Self-pay | Admitting: Neurology

## 2018-10-08 ENCOUNTER — Encounter: Payer: Self-pay | Admitting: Nurse Practitioner

## 2018-10-08 VITALS — BP 129/72 | HR 82 | Ht 63.0 in | Wt 156.8 lb

## 2018-10-08 DIAGNOSIS — Z9989 Dependence on other enabling machines and devices: Secondary | ICD-10-CM

## 2018-10-08 DIAGNOSIS — M25551 Pain in right hip: Secondary | ICD-10-CM | POA: Diagnosis not present

## 2018-10-08 DIAGNOSIS — M6281 Muscle weakness (generalized): Secondary | ICD-10-CM | POA: Diagnosis not present

## 2018-10-08 DIAGNOSIS — G4733 Obstructive sleep apnea (adult) (pediatric): Secondary | ICD-10-CM | POA: Diagnosis not present

## 2018-10-08 DIAGNOSIS — M62838 Other muscle spasm: Secondary | ICD-10-CM | POA: Diagnosis not present

## 2018-10-08 NOTE — Patient Instructions (Signed)
CPAP compliance 90 % Continue same settings F/U 1 year

## 2018-10-08 NOTE — Telephone Encounter (Signed)
Patient called Saturday to ask if she needs to bring her CPAP machine for the appointment with CM. I asked her I she had a  previous visit with the same machine, and she assured me yes, she had.  Does not need to bring machine.  Compliance visit .

## 2018-10-10 DIAGNOSIS — M6281 Muscle weakness (generalized): Secondary | ICD-10-CM | POA: Diagnosis not present

## 2018-10-10 DIAGNOSIS — M62838 Other muscle spasm: Secondary | ICD-10-CM | POA: Diagnosis not present

## 2018-10-10 DIAGNOSIS — M25551 Pain in right hip: Secondary | ICD-10-CM | POA: Diagnosis not present

## 2018-10-12 DIAGNOSIS — M25551 Pain in right hip: Secondary | ICD-10-CM | POA: Diagnosis not present

## 2018-10-12 DIAGNOSIS — M545 Low back pain: Secondary | ICD-10-CM | POA: Diagnosis not present

## 2018-10-12 DIAGNOSIS — M62838 Other muscle spasm: Secondary | ICD-10-CM | POA: Diagnosis not present

## 2018-10-12 DIAGNOSIS — M6281 Muscle weakness (generalized): Secondary | ICD-10-CM | POA: Diagnosis not present

## 2018-10-15 DIAGNOSIS — M25551 Pain in right hip: Secondary | ICD-10-CM | POA: Diagnosis not present

## 2018-10-15 DIAGNOSIS — M545 Low back pain: Secondary | ICD-10-CM | POA: Diagnosis not present

## 2018-10-15 DIAGNOSIS — M6281 Muscle weakness (generalized): Secondary | ICD-10-CM | POA: Diagnosis not present

## 2018-10-15 DIAGNOSIS — M62838 Other muscle spasm: Secondary | ICD-10-CM | POA: Diagnosis not present

## 2018-10-22 DIAGNOSIS — M62838 Other muscle spasm: Secondary | ICD-10-CM | POA: Diagnosis not present

## 2018-10-22 DIAGNOSIS — M25551 Pain in right hip: Secondary | ICD-10-CM | POA: Diagnosis not present

## 2018-10-22 DIAGNOSIS — M6281 Muscle weakness (generalized): Secondary | ICD-10-CM | POA: Diagnosis not present

## 2018-11-05 DIAGNOSIS — Z1231 Encounter for screening mammogram for malignant neoplasm of breast: Secondary | ICD-10-CM | POA: Diagnosis not present

## 2018-11-09 DIAGNOSIS — R922 Inconclusive mammogram: Secondary | ICD-10-CM | POA: Diagnosis not present

## 2018-11-09 DIAGNOSIS — N6489 Other specified disorders of breast: Secondary | ICD-10-CM | POA: Diagnosis not present

## 2018-11-14 DIAGNOSIS — Z85828 Personal history of other malignant neoplasm of skin: Secondary | ICD-10-CM | POA: Diagnosis not present

## 2018-11-14 DIAGNOSIS — L570 Actinic keratosis: Secondary | ICD-10-CM | POA: Diagnosis not present

## 2018-11-14 DIAGNOSIS — L821 Other seborrheic keratosis: Secondary | ICD-10-CM | POA: Diagnosis not present

## 2018-11-14 DIAGNOSIS — L814 Other melanin hyperpigmentation: Secondary | ICD-10-CM | POA: Diagnosis not present

## 2018-11-14 DIAGNOSIS — L700 Acne vulgaris: Secondary | ICD-10-CM | POA: Diagnosis not present

## 2018-11-14 DIAGNOSIS — L298 Other pruritus: Secondary | ICD-10-CM | POA: Diagnosis not present

## 2018-11-21 DIAGNOSIS — R040 Epistaxis: Secondary | ICD-10-CM | POA: Diagnosis not present

## 2018-11-21 DIAGNOSIS — K219 Gastro-esophageal reflux disease without esophagitis: Secondary | ICD-10-CM | POA: Diagnosis not present

## 2018-11-21 DIAGNOSIS — J342 Deviated nasal septum: Secondary | ICD-10-CM | POA: Diagnosis not present

## 2018-11-21 DIAGNOSIS — G4733 Obstructive sleep apnea (adult) (pediatric): Secondary | ICD-10-CM | POA: Diagnosis not present

## 2018-11-28 ENCOUNTER — Ambulatory Visit: Payer: Self-pay | Admitting: Otolaryngology

## 2018-11-28 NOTE — Pre-Procedure Instructions (Signed)
Stacy Parker  11/28/2018     [id number] - [place], [place] - [address] AT [place] [address] [name] Phone: [phone] Fax: [phone]   Your procedure is scheduled on Monday, December 03, 2018  Report to [facility] Admitting at  6:00 A.M.  Call this number if you have problems the morning of surgery:  [phone]   Remember:  Do not eat or drink after midnight.    Take these medicines the morning of surgery with A SIP OF WATER   citalopram (CELEXA) methocarbamol (ROBAXIN)  pantoprazole (PROTONIX) Polyethyl Glycol-Propyl Glycol (SYSTANE) - eye drops Sodium Chloride-Xylitol (XLEAR SINUS CARE SPRAY NA)   7 days prior to surgery STOP taking any Aspirin (unless otherwise instructed by your surgeon), Aleve, Naproxen, Ibuprofen, Motrin, Advil, Goody's, BC's, all herbal medications, fish oil, and all vitamins.  Follow your surgeon's instructions on when to stop Aspirin.  If no instructions were given by your surgeon then you will need to call the office to get those instructions.     Do not wear jewelry, make-up or nail polish.  Do not wear lotions, powders, or perfumes, or deodorant.  Do not shave 48 hours prior to surgery.  Men may shave face and neck.  Do not bring valuables to the hospital.  [facility] is not responsible for any belongings or valuables.  Contacts, dentures or bridgework may not be worn into surgery.  Leave your suitcase in the car.  After surgery it may be brought to your room.  For patients admitted to the hospital, discharge time will be determined by your treatment team.  Patients discharged the day of surgery will not be allowed to drive home.   Special instructions:    [place]- Preparing For Surgery  Before surgery, you can play an important role. Because skin is not sterile, your skin needs to be as free of germs as possible. You can reduce the number of  germs on your skin by washing with CHG (chlorahexidine gluconate) Soap before surgery.  CHG is an antiseptic cleaner which kills germs and bonds with the skin to continue killing germs even after washing.    Oral Hygiene is also important to reduce your risk of infection.  Remember - BRUSH YOUR TEETH THE MORNING OF SURGERY WITH YOUR REGULAR TOOTHPASTE  Please do not use if you have an allergy to CHG or antibacterial soaps. If your skin becomes reddened/irritated stop using the CHG.  Do not shave (including legs and underarms) for at least 48 hours prior to first CHG shower. It is OK to shave your face.  Please follow these instructions carefully.   1. Shower the NIGHT BEFORE SURGERY and the MORNING OF SURGERY with CHG.   2. If you chose to wash your hair, wash your hair first as usual with your normal shampoo.  3. After you shampoo, rinse your hair and body thoroughly to remove the shampoo.  4. Use CHG as you would any other liquid soap. You can apply CHG directly to the skin and wash gently with a scrungie or a clean washcloth.   5. Apply the CHG Soap to your body ONLY FROM THE NECK DOWN.  Do not use on open wounds or open sores. Avoid contact with your eyes, ears, mouth and genitals (private parts). Wash Face and genitals (private parts)  with your normal soap.  6. Wash thoroughly, paying special attention to  the area where your surgery will be performed.  7. Thoroughly rinse your body with warm water from the neck down.  8. DO NOT shower/wash with your normal soap after using and rinsing off the CHG Soap.  9. Pat yourself dry with a CLEAN TOWEL.  10. Wear CLEAN PAJAMAS to bed the night before surgery, wear comfortable clothes the morning of surgery  11. Place CLEAN SHEETS on your bed the night of your first shower and DO NOT SLEEP WITH PETS.  Day of Surgery:  Do not apply any deodorants/lotions.  Please wear clean clothes to the hospital/surgery center.   Remember to brush your  teeth WITH YOUR REGULAR TOOTHPASTE.  Please read over the following fact sheets that you were given. Pain Booklet, Coughing and Deep Breathing and Surgical Site Infection Prevention

## 2018-11-28 NOTE — H&P (Signed)
PREOPERATIVE H&P  Chief Complaint: chronic right-sided nasal obstruction and recurrent epistaxis  HPI: Stacy Parker is a 85 y.o. female who presents for evaluation of chronic right-sided nasal obstruction and recurrent epistaxis. She has known for years that she has a septal deviation. More recently has had frequent right-sided nose bleeds. On exam she has a moderate severe septal deviation to the right with crusting along the right side of the septum. She is taken to the operating room at this timefor septoplasty and turbinate reductions.  Past Medical History:  Diagnosis Date  . Anxiety   . Arthritis   . Basal cell carcinoma   . Cervical arthritis    PT STATES CERVICAL SPINE BADLY DEGENERATED--SHE DOES NOT HAVE NECK PAIN -BUT TOLD HER NECK IS FRAGILE  . Complication of anesthesia    myalgia   . Diverticulosis   . DVT (deep venous thrombosis) (HCC)    Left leg  . GERD (gastroesophageal reflux disease)   . H/O hiatal hernia    repairsed  . HTN (hypertension)   . Neuropathy of foot, left   . OSA on CPAP   . Osteopenia   . Osteoporosis   . Reflux   . Sciatic nerve disease   . Sciatic pain    RIGHT LEG / BACK PAIN- PT ON OXYCONTIN 3 TIMES A DAY--STATES MUST CONTINUE WHILE IN HOSPITAL TO AVOID WITHDRAWAL  . Sleep apnea 04/12/2012   cpap  . Squamous carcinoma    Past Surgical History:  Procedure Laterality Date  . BREAST SURGERY     Fibroma, right  . HERNIA REPAIR    . HIATAL HERNIA REPAIR  04/17/2012   Procedure: LAPAROSCOPIC REPAIR OF HIATAL HERNIA;  Surgeon: Pedro Earls, MD;  Location: [facility];  Service: General;  Laterality: N/A;  Large Hiatus Hernia  . INTRAOPERATIVE ARTERIOGRAM    . neuro stimulator     THORACIC AREA-BATTERY IN RIGHT HIP PT TURNS OFF AND ON WITH MAGNET-PT STATES SHE IS PAIN FREE WHEN LYING DOWN -BUT WHEN SITTING OR STANDING SHE EXPERIENCES TINGLING AND PAIN DOWN RIGHT LEG-SCIATIC PAIN  . SPINAL FUSION     8 level  LUMBAR/THORACIC  . TOTAL HIP  ARTHROPLASTY     left  . TOTAL HIP ARTHROPLASTY Right 03/29/2017   Procedure: RIGHT TOTAL HIP ARTHROPLASTY ANTERIOR APPROACH;  Surgeon: Gaynelle Arabian, MD;  Location: [facility];  Service: Orthopedics;  Laterality: Right;  . TOTAL VAGINAL HYSTERECTOMY  1993  . TUBAL LIGATION    . verteboplasty     Social History   Socioeconomic History  . Marital status: Married    Spouse name: Not on file  . Number of children: 2  . Years of education: Not on file  . Highest education level: Not on file  Occupational History  . Occupation: retired    Fish farm manager: RETIRED  Social Needs  . Financial resource strain: Not on file  . Food insecurity:    Worry: Not on file    Inability: Not on file  . Transportation needs:    Medical: Not on file    Non-medical: Not on file  Tobacco Use  . Smoking status: Former Smoker    Types: Cigarettes  . Smokeless tobacco: Never Used  . Tobacco comment: QUIT SMOKING IN HER EARLY 40'S  AND WAS NEVER A HEAVY SMOKER  Substance and Sexual Activity  . Alcohol use: Yes    Alcohol/week: 0.0 standard drinks    Comment: rarely  . Drug use: No  . Sexual  activity: Never    Birth control/protection: Surgical    Comment: DECLINED INSURCNACE QUESTIONS  Lifestyle  . Physical activity:    Days per week: Not on file    Minutes per session: Not on file  . Stress: Not on file  Relationships  . Social connections:    Talks on phone: Not on file    Gets together: Not on file    Attends religious service: Not on file    Active member of club or organization: Not on file    Attends meetings of clubs or organizations: Not on file    Relationship status: Not on file  Other Topics Concern  . Not on file  Social History Narrative  . Not on file   Family History  Problem Relation Age of Onset  . Hypertension Mother   . COPD Mother   . Heart disease Father   . Hypertension Father   . Skin cancer Father   . Breast cancer Maternal Grandmother    Allergies  Allergen  Reactions  . Ace Inhibitors Other (See Comments)    Unsure of what reaction was  . Alendronate Sodium   . Ambien  [Zolpidem Tartrate] Diarrhea  . Boniva [Ibandronic Acid] Other (See Comments)    Flu like symptoms STATES ALL THE OSTEOPOROSIS DRUGS HAVE CAUSED FLU LIKE SYMPTOMS   . Ceftin  [Cefuroxime Axetil] Diarrhea and Swelling  . Cymbalta [Duloxetine Hcl] Nausea Only  . Dicloxacillin Diarrhea    Has patient had a PCN reaction causing immediate rash, facial/tongue/throat swelling, SOB or lightheadedness with hypotension:unsure Has patient had a PCN reaction causing severe rash involving mucus membranes or skin necrosis:unsure Has patient had a PCN reaction that required hospitalization:NO Has patient had a PCN reaction occurring within the last 10 years:unsure If all of the above answers are "NO", then may proceed with Cephalosporin use.   Marland Kitchen Doxycycline Other (See Comments) and Nausea Only    unknown  . Metronidazole Nausea And Vomiting  . Phenergan [Promethazine Hcl] Other (See Comments)    Other Reaction: CNS Disorder Cough/nausea/sweating   . Succinylcholine Other (See Comments)    Muscle soreness all over body post operatively ("felt like I was hit by [facility] truck")   Prior to Admission medications   Medication Sig Start Date End Date Taking? Authorizing Provider  aspirin EC 81 MG tablet Take 81 mg by mouth 4 (four) times a week. Sundays, Tuesdays, Thursdays, Saturdays.   Yes [provider]  BIOTIN PO Take 6,000 mcg by mouth daily.   Yes [provider]  cholecalciferol (VITAMIN D) 25 MCG (1000 UT) tablet Take 1,000 Units by mouth daily.   Yes [provider]  citalopram (CELEXA) 40 MG tablet Take 40 mg by mouth daily.   Yes [provider]  docusate sodium (COLACE) 100 MG capsule Take 100 mg by mouth 2 (two) times daily.   Yes [provider]  losartan-hydrochlorothiazide (HYZAAR) 100-25 MG tablet Take 1 tablet by mouth every  evening.    Yes [provider]  Magnesium 250 MG TABS Take 250 mg by mouth daily.   Yes [provider]  Melatonin 5 MG TABS Take 5 mg by mouth at bedtime.    Yes [provider]  methocarbamol (ROBAXIN) 500 MG tablet Take 500 mg by mouth daily as needed (for muscle spasm/cramps (RARELY USES)).    Yes [provider]  Multiple Vitamin (MULTIVITAMIN WITH MINERALS) TABS tablet Take 1 tablet by mouth daily. Senior Multivitamin  Yes [provider]  Multiple Vitamins-Minerals (PRESERVISION AREDS 2 PO) Take 1 tablet by mouth 2 (two) times daily.   Yes [provider]  Omega-3 Fatty Acids (FISH OIL) 1200 MG CAPS Take 1,200 mg by mouth 2 (two) times daily.   Yes [provider]  Loma [place] (OYSTER CALCIUM) 500 MG TABS tablet Take 500 mg of elemental calcium by mouth daily.   Yes [provider]  pantoprazole (PROTONIX) 40 MG tablet Take 40 mg by mouth at bedtime.  05/04/12 11/22/19 Yes Johnathan Hausen, MD  Polyethyl Glycol-Propyl Glycol (SYSTANE) 0.4-0.3 % SOLN Place 1 drop into both eyes 2 (two) times daily.   Yes [provider]  QUININE SULFATE PO Take 300 mg by mouth at bedtime. For cramps    Yes [provider]  Sodium Chloride-Xylitol (XLEAR SINUS CARE SPRAY NA) Place 2 sprays into the nose at bedtime.   Yes [provider]  TURMERIC PO Take 300 mg by mouth daily.   Yes [provider]     Positive ROS: per history of present illness  All other systems have been reviewed and were otherwise negative with the exception of those mentioned in the HPI and as above.  Physical Exam: There were no vitals filed for this visit.  General: Alert, no acute distress Oral: Normal oral mucosa and tonsils Nasal: anterior septal deviation to the right with crusting along the anterior right septum. Remaining nasal cavity clear Neck: No palpable adenopathy or thyroid nodules Ear: Ear canal is clear  with normal appearing TMs Cardiovascular: Regular rate and rhythm, no murmur.  Respiratory: Clear to auscultation Neurologic: Alert and oriented x 3   Assessment/Plan: DEVIATED SEPTUM, HYPERTROPY BILATERAL TURBINATES Plan for Procedure(s): NASAL SEPTOPLASTY WITH BILATERAL TURBINATE REDUCTION   [name], MD 11/28/2018 1:45 PM

## 2018-11-29 ENCOUNTER — Encounter (HOSPITAL_COMMUNITY)
Admission: RE | Admit: 2018-11-29 | Discharge: 2018-11-29 | Disposition: A | Discharge status: Home / Self Care | Payer: Medicare Other | Source: Ambulatory Visit | Attending: Otolaryngology | Admitting: Otolaryngology

## 2018-11-29 ENCOUNTER — Encounter (HOSPITAL_COMMUNITY): Payer: Self-pay

## 2018-11-29 ENCOUNTER — Other Ambulatory Visit: Payer: Self-pay

## 2018-11-29 DIAGNOSIS — Z01818 Encounter for other preprocedural examination: Secondary | ICD-10-CM | POA: Insufficient documentation

## 2018-11-29 LAB — CBC
HCT: 42.5 % (ref 36.0–46.0)
HEMOGLOBIN: 13.6 g/dL (ref 12.0–15.0)
MCH: 31.9 pg (ref 26.0–34.0)
MCHC: 32 g/dL (ref 30.0–36.0)
MCV: 99.8 fL (ref 80.0–100.0)
Platelets: 278 10*3/uL (ref 150–400)
RBC: 4.26 MIL/uL (ref 3.87–5.11)
RDW: 13.2 % (ref 11.5–15.5)
WBC: 8.3 10*3/uL (ref 4.0–10.5)
nRBC: 0 % (ref 0.0–0.2)

## 2018-11-29 LAB — BASIC METABOLIC PANEL
Anion gap: 9 (ref 5–15)
BUN: 24 mg/dL — ABNORMAL HIGH (ref 8–23)
CO2: 26 mmol/L (ref 22–32)
Calcium: 9.3 mg/dL (ref 8.9–10.3)
Chloride: 106 mmol/L (ref 98–111)
Creatinine, Ser: 1.12 mg/dL — ABNORMAL HIGH (ref 0.44–1.00)
GFR calc Af Amer: 53 mL/min — ABNORMAL LOW (ref >60)
GFR calc non Af Amer: 46 mL/min — ABNORMAL LOW (ref >60)
Glucose, Bld: 75 mg/dL (ref 70–99)
Potassium: 3.6 mmol/L (ref 3.5–5.1)
Sodium: 141 mmol/L (ref 135–145)

## 2018-11-29 NOTE — Progress Notes (Addendum)
PCP - Dr. Burnard Bunting Cardiologist - denies  Chest x-ray - N/A EKG - 11/29/2018 Stress Test - 20+ years ago ECHO - denies Cardiac Cath - denies  Sleep Study - OSA positive; uses CPAP nightly.   Aspirin Instructions: Pt has stopped taking ASA as of 11/27/2018.  Anesthesia review: Yes, EKG review  Patient denies shortness of breath, fever, cough and chest pain at PAT appointment   Patient verbalized understanding of instructions that were given to them at the PAT appointment. Patient was also instructed that they will need to review over the PAT instructions again at home before surgery.

## 2018-11-30 NOTE — Anesthesia Preprocedure Evaluation (Addendum)
Anesthesia Evaluation  Patient identified by MRN, date of birth, ID band Patient awake    Reviewed: Allergy & Precautions, NPO status , Patient's Chart, lab work & pertinent test results  Airway Mallampati: II  TM Distance: >3 FB Neck ROM: Limited    Dental no notable dental hx.    Pulmonary sleep apnea and Continuous Positive Airway Pressure Ventilation , former smoker,    Pulmonary exam normal breath sounds clear to auscultation       Cardiovascular hypertension, Normal cardiovascular exam Rhythm:Regular Rate:Normal     Neuro/Psych negative neurological ROS  negative psych ROS   GI/Hepatic Neg liver ROS, GERD  ,  Endo/Other  negative endocrine ROS  Renal/GU negative Renal ROS  negative genitourinary   Musculoskeletal negative musculoskeletal ROS (+)   Abdominal   Peds negative pediatric ROS (+)  Hematology negative hematology ROS (+)   Anesthesia Other Findings   Reproductive/Obstetrics negative OB ROS                            Anesthesia Physical Anesthesia Plan  ASA: III  Anesthesia Plan: General   Post-op Pain Management:    Induction: Intravenous  PONV Risk Score and Plan: 3 and Ondansetron, Dexamethasone and Treatment may vary due to age or medical condition  Airway Management Planned: Oral ETT  Additional Equipment:   Intra-op Plan:   Post-operative Plan: Extubation in OR  Informed Consent: I have reviewed the patients History and Physical, chart, labs and discussed the procedure including the risks, benefits and alternatives for the proposed anesthesia with the patient or authorized representative who has indicated his/her understanding and acceptance.     Dental advisory given  Plan Discussed with: CRNA and Surgeon  Anesthesia Plan Comments: (See PAT note by Karoline Caldwell, PA-C )       Anesthesia Quick Evaluation

## 2018-11-30 NOTE — Progress Notes (Signed)
Anesthesia Chart Review:  Case:  [id number] Date/Time:  12/03/18 0745   Procedure:  NASAL SEPTOPLASTY WITH BILATERAL TURBINATE REDUCTION (Bilateral )   Anesthesia type:  General   Pre-op diagnosis:  DEVIATED SEPTUM, HYPERTROPY BILATERAL TURBINATES   Location:  [facility] ROOM 09 / [place] [facility]   Surgeon:  Rozetta Nunnery, MD      DISCUSSION: 85 yo female former smoker. Pertinent hx includes HTN, GERD, OSA on CPAP, DVT (2010 postop spine surgery), s/p multilevel spinal fusion and spinal neurostimulator in place.  Pt was noted to have LBBB in preop prior to Right THA in 2018. Per note in Epic 03/22/2017 pt's PCP office contacted and she was cleared to proceed with surgery. She had THA 6/43/3295 without complication.     EKG done at PAT 11/29/2018 continues to show LBBB, unchanged from tracing in 2018. Pt has not had cardiology eval of LBBB, no echo. Per PCP notes she does maintain good activity level, exercises at [facility]. Discussed case with Dr. Ambrose Pancoast. He advised okay to proceed as long as pt remains asymptomatic.  VS: BP (!) 126/59   Pulse 67   Temp 36.7 C   Resp 20   Ht 5\' 3"  (1.6 m)   Wt 70.4 kg   SpO2 100%   BMI 27.49 kg/m   PROVIDERS: Burnard Bunting, MD is PCP   LABS: Labs reviewed: Acceptable for surgery. (all labs ordered are listed, but only abnormal results are displayed)  Labs Reviewed  BASIC METABOLIC PANEL - Abnormal; Notable for the following components:      Result Value   BUN 24 (*)    Creatinine, Ser 1.12 (*)    GFR calc non Af Amer 46 (*)    GFR calc Af Amer 53 (*)    All other components within normal limits  CBC    EKG: 11/30/2017: Normal sinus rhythm. Rate 68. Left axis deviation. Left bundle branch block. No change from preivous.  CV: N/A  Past Medical History:  Diagnosis Date  . Anxiety   . Arthritis   . Basal cell carcinoma   . Cervical arthritis    PT STATES CERVICAL SPINE BADLY DEGENERATED--SHE DOES NOT HAVE NECK PAIN -BUT TOLD HER NECK IS FRAGILE   . Complication of anesthesia    myalgia   . Diverticulosis   . DVT (deep venous thrombosis) (HCC)    Left leg  . GERD (gastroesophageal reflux disease)   . H/O hiatal hernia    repairsed  . HTN (hypertension)   . Neuropathy of foot, left   . OSA on CPAP   . Osteopenia   . Osteoporosis   . Reflux   . Sciatic nerve disease   . Sciatic pain    RIGHT LEG / BACK PAIN- PT ON OXYCONTIN 3 TIMES A DAY--STATES MUST CONTINUE WHILE IN HOSPITAL TO AVOID WITHDRAWAL  . Sleep apnea 04/12/2012   cpap  . Squamous carcinoma     Past Surgical History:  Procedure Laterality Date  . ABDOMINAL HYSTERECTOMY    . BREAST SURGERY     Fibroma, right  . EYE SURGERY     "wrinkle in left eye retina"  . HERNIA REPAIR    . HIATAL HERNIA REPAIR  04/17/2012   Procedure: LAPAROSCOPIC REPAIR OF HIATAL HERNIA;  Surgeon: Pedro Earls, MD;  Location: [facility];  Service: General;  Laterality: N/A;  Large Hiatus Hernia  . INTRAOPERATIVE ARTERIOGRAM    . neuro stimulator     THORACIC AREA-BATTERY IN RIGHT  HIP PT TURNS OFF AND ON WITH MAGNET-PT STATES SHE IS PAIN FREE WHEN LYING DOWN -BUT WHEN SITTING OR STANDING SHE EXPERIENCES TINGLING AND PAIN DOWN RIGHT LEG-SCIATIC PAIN  . SPINAL FUSION     8 level  LUMBAR/THORACIC  . TOTAL HIP ARTHROPLASTY     left  . TOTAL HIP ARTHROPLASTY Right 03/29/2017   Procedure: RIGHT TOTAL HIP ARTHROPLASTY ANTERIOR APPROACH;  Surgeon: Gaynelle Arabian, MD;  Location: [facility];  Service: Orthopedics;  Laterality: Right;  . TOTAL VAGINAL HYSTERECTOMY  1993  . TUBAL LIGATION    . verteboplasty      MEDICATIONS: . aspirin EC 81 MG tablet  . BIOTIN PO  . cholecalciferol (VITAMIN D) 25 MCG (1000 UT) tablet  . citalopram (CELEXA) 40 MG tablet  . docusate sodium (COLACE) 100 MG capsule  . losartan-hydrochlorothiazide (HYZAAR) 100-25 MG tablet  . Magnesium 250 MG TABS  . Melatonin 5 MG TABS  . methocarbamol (ROBAXIN) 500 MG tablet  . Multiple Vitamin (MULTIVITAMIN WITH MINERALS) TABS  tablet  . Multiple Vitamins-Minerals (PRESERVISION AREDS 2 PO)  . Omega-3 Fatty Acids (FISH OIL) 1200 MG CAPS  . Oyster Shell (OYSTER CALCIUM) 500 MG TABS tablet  . pantoprazole (PROTONIX) 40 MG tablet  . Polyethyl Glycol-Propyl Glycol (SYSTANE) 0.4-0.3 % SOLN  . QUININE SULFATE PO  . Sodium Chloride-Xylitol (XLEAR SINUS CARE SPRAY NA)  . TURMERIC PO   No current facility-administered medications for this encounter.     Wynonia Musty [facility] Short Stay Center/Anesthesiology Phone [phone] 11/30/2018 10:34 AM

## 2018-12-03 ENCOUNTER — Encounter (HOSPITAL_COMMUNITY): Payer: Self-pay

## 2018-12-03 ENCOUNTER — Other Ambulatory Visit: Payer: Self-pay

## 2018-12-03 ENCOUNTER — Observation Stay (HOSPITAL_COMMUNITY)
Admission: RE | Admit: 2018-12-03 | Discharge: 2018-12-04 | Disposition: A | Discharge status: Home / Self Care | Payer: Medicare Other | Attending: Otolaryngology | Admitting: Otolaryngology

## 2018-12-03 ENCOUNTER — Ambulatory Visit (HOSPITAL_COMMUNITY): Payer: Medicare Other | Admitting: Vascular Surgery

## 2018-12-03 ENCOUNTER — Encounter (HOSPITAL_COMMUNITY)
Admission: RE | Disposition: A | Discharge status: Home / Self Care | Payer: Self-pay | Source: Home / Self Care | Attending: Otolaryngology

## 2018-12-03 ENCOUNTER — Ambulatory Visit (HOSPITAL_COMMUNITY): Payer: Medicare Other | Admitting: Certified Registered"

## 2018-12-03 DIAGNOSIS — I10 Essential (primary) hypertension: Secondary | ICD-10-CM | POA: Diagnosis not present

## 2018-12-03 DIAGNOSIS — Z85828 Personal history of other malignant neoplasm of skin: Secondary | ICD-10-CM | POA: Insufficient documentation

## 2018-12-03 DIAGNOSIS — Z888 Allergy status to other drugs, medicaments and biological substances status: Secondary | ICD-10-CM | POA: Diagnosis not present

## 2018-12-03 DIAGNOSIS — R040 Epistaxis: Secondary | ICD-10-CM | POA: Insufficient documentation

## 2018-12-03 DIAGNOSIS — K219 Gastro-esophageal reflux disease without esophagitis: Secondary | ICD-10-CM | POA: Insufficient documentation

## 2018-12-03 DIAGNOSIS — G4733 Obstructive sleep apnea (adult) (pediatric): Secondary | ICD-10-CM | POA: Diagnosis not present

## 2018-12-03 DIAGNOSIS — Z7982 Long term (current) use of aspirin: Secondary | ICD-10-CM | POA: Diagnosis not present

## 2018-12-03 DIAGNOSIS — Z86718 Personal history of other venous thrombosis and embolism: Secondary | ICD-10-CM | POA: Insufficient documentation

## 2018-12-03 DIAGNOSIS — F419 Anxiety disorder, unspecified: Secondary | ICD-10-CM | POA: Insufficient documentation

## 2018-12-03 DIAGNOSIS — J342 Deviated nasal septum: Principal | ICD-10-CM | POA: Insufficient documentation

## 2018-12-03 DIAGNOSIS — Z881 Allergy status to other antibiotic agents status: Secondary | ICD-10-CM | POA: Diagnosis not present

## 2018-12-03 DIAGNOSIS — Z79899 Other long term (current) drug therapy: Secondary | ICD-10-CM | POA: Diagnosis not present

## 2018-12-03 DIAGNOSIS — M1990 Unspecified osteoarthritis, unspecified site: Secondary | ICD-10-CM | POA: Insufficient documentation

## 2018-12-03 DIAGNOSIS — J343 Hypertrophy of nasal turbinates: Secondary | ICD-10-CM | POA: Diagnosis not present

## 2018-12-03 DIAGNOSIS — Z87891 Personal history of nicotine dependence: Secondary | ICD-10-CM | POA: Diagnosis not present

## 2018-12-03 HISTORY — PX: NASAL SEPTOPLASTY W/ TURBINOPLASTY: SHX2070

## 2018-12-03 SURGERY — SEPTOPLASTY, NOSE, WITH NASAL TURBINATE REDUCTION
Anesthesia: General | Site: Nose | Laterality: Bilateral

## 2018-12-03 MED ORDER — MELATONIN 5 MG PO TABS: 5.0000 mg | ORAL_TABLET | Freq: Every day | ORAL | Status: DC

## 2018-12-03 MED ORDER — LIDOCAINE-EPINEPHRINE 1 %-1:100000 IJ SOLN
INTRAMUSCULAR | Status: AC
  Filled 2018-12-03: qty 1

## 2018-12-03 MED ORDER — OXYMETAZOLINE HCL 0.05 % NA SOLN
NASAL | Status: DC | PRN
  Administered 2018-12-03: 1

## 2018-12-03 MED ORDER — CEFAZOLIN SODIUM-DEXTROSE 2-4 GM/100ML-% IV SOLN
2.0000 g | INTRAVENOUS | Status: AC
  Administered 2018-12-03: 2 g via INTRAVENOUS

## 2018-12-03 MED ORDER — MAGNESIUM OXIDE 400 (241.3 MG) MG PO TABS
200.0000 mg | ORAL_TABLET | Freq: Every day | ORAL | Status: DC
  Administered 2018-12-04: 200 mg via ORAL
  Filled 2018-12-03: qty 1

## 2018-12-03 MED ORDER — METHOCARBAMOL 500 MG PO TABS: 500.0000 mg | ORAL_TABLET | Freq: Every day | ORAL | Status: DC | PRN

## 2018-12-03 MED ORDER — LOSARTAN POTASSIUM-HCTZ 100-25 MG PO TABS: 1.0000 | ORAL_TABLET | Freq: Every evening | ORAL | Status: DC

## 2018-12-03 MED ORDER — PROPOFOL 10 MG/ML IV BOLUS
INTRAVENOUS | Status: AC
  Filled 2018-12-03: qty 20

## 2018-12-03 MED ORDER — ONDANSETRON HCL 4 MG/2ML IJ SOLN
INTRAMUSCULAR | Status: DC | PRN
  Administered 2018-12-03: 4 mg via INTRAVENOUS

## 2018-12-03 MED ORDER — ROCURONIUM BROMIDE 50 MG/5ML IV SOSY
PREFILLED_SYRINGE | INTRAVENOUS | Status: AC
  Filled 2018-12-03: qty 5

## 2018-12-03 MED ORDER — PROPOFOL 10 MG/ML IV BOLUS
INTRAVENOUS | Status: DC | PRN
  Administered 2018-12-03: 20 mg via INTRAVENOUS
  Administered 2018-12-03: 100 mg via INTRAVENOUS

## 2018-12-03 MED ORDER — LACTATED RINGERS IV SOLN
INTRAVENOUS | Status: DC
  Administered 2018-12-03: 08:00:00 via INTRAVENOUS

## 2018-12-03 MED ORDER — DOCUSATE SODIUM 100 MG PO CAPS
100.0000 mg | ORAL_CAPSULE | Freq: Two times a day (BID) | ORAL | Status: DC
  Administered 2018-12-04: 100 mg via ORAL
  Filled 2018-12-03 (×2): qty 1

## 2018-12-03 MED ORDER — IBUPROFEN 100 MG/5ML PO SUSP
400.0000 mg | Freq: Four times a day (QID) | ORAL | Status: DC | PRN
  Filled 2018-12-03: qty 20

## 2018-12-03 MED ORDER — HYDROCHLOROTHIAZIDE 25 MG PO TABS
25.0000 mg | ORAL_TABLET | Freq: Every day | ORAL | Status: DC
  Administered 2018-12-03: 25 mg via ORAL
  Filled 2018-12-03: qty 1

## 2018-12-03 MED ORDER — HYDROMORPHONE HCL 1 MG/ML IJ SOLN
INTRAMUSCULAR | Status: AC
  Filled 2018-12-03: qty 1

## 2018-12-03 MED ORDER — POLYVINYL ALCOHOL 1.4 % OP SOLN
1.0000 [drp] | Freq: Two times a day (BID) | OPHTHALMIC | Status: DC
  Administered 2018-12-04: 1 [drp] via OPHTHALMIC
  Filled 2018-12-03: qty 15

## 2018-12-03 MED ORDER — LIDOCAINE 2% (20 MG/ML) 5 ML SYRINGE
INTRAMUSCULAR | Status: DC | PRN
  Administered 2018-12-03: 60 mg via INTRAVENOUS

## 2018-12-03 MED ORDER — LIDOCAINE-EPINEPHRINE 1 %-1:100000 IJ SOLN
INTRAMUSCULAR | Status: DC | PRN
  Administered 2018-12-03: 22 mL

## 2018-12-03 MED ORDER — FENTANYL CITRATE (PF) 250 MCG/5ML IJ SOLN
INTRAMUSCULAR | Status: AC
  Filled 2018-12-03: qty 5

## 2018-12-03 MED ORDER — CHLORHEXIDINE GLUCONATE CLOTH 2 % EX PADS: 6.0000 | MEDICATED_PAD | Freq: Once | CUTANEOUS | Status: DC

## 2018-12-03 MED ORDER — PANTOPRAZOLE SODIUM 40 MG PO TBEC
40.0000 mg | DELAYED_RELEASE_TABLET | Freq: Every day | ORAL | Status: DC
  Administered 2018-12-03: 40 mg via ORAL
  Filled 2018-12-03: qty 1

## 2018-12-03 MED ORDER — CITALOPRAM HYDROBROMIDE 20 MG PO TABS
40.0000 mg | ORAL_TABLET | Freq: Every day | ORAL | Status: DC
  Administered 2018-12-04: 40 mg via ORAL
  Filled 2018-12-03: qty 2

## 2018-12-03 MED ORDER — MELATONIN 3 MG PO TABS
3.0000 mg | ORAL_TABLET | Freq: Every day | ORAL | Status: DC
  Administered 2018-12-03: 3 mg via ORAL
  Filled 2018-12-03: qty 1

## 2018-12-03 MED ORDER — ONDANSETRON HCL 4 MG/2ML IJ SOLN
INTRAMUSCULAR | Status: AC
  Filled 2018-12-03: qty 2

## 2018-12-03 MED ORDER — MIDAZOLAM HCL 5 MG/5ML IJ SOLN
INTRAMUSCULAR | Status: DC | PRN
  Administered 2018-12-03: 2 mg via INTRAVENOUS

## 2018-12-03 MED ORDER — TURMERIC 400 MG PO CAPS: 300.0000 mg | ORAL_CAPSULE | Freq: Every day | ORAL | Status: DC

## 2018-12-03 MED ORDER — MIDAZOLAM HCL 2 MG/2ML IJ SOLN
INTRAMUSCULAR | Status: AC
  Filled 2018-12-03: qty 2

## 2018-12-03 MED ORDER — BACITRACIN ZINC 500 UNIT/GM EX OINT
TOPICAL_OINTMENT | CUTANEOUS | Status: AC
  Filled 2018-12-03: qty 28.35

## 2018-12-03 MED ORDER — SUGAMMADEX SODIUM 200 MG/2ML IV SOLN
INTRAVENOUS | Status: DC | PRN
  Administered 2018-12-03: 100 mg via INTRAVENOUS

## 2018-12-03 MED ORDER — HYDROCODONE-ACETAMINOPHEN 5-325 MG PO TABS
1.0000 | ORAL_TABLET | ORAL | Status: DC | PRN
  Administered 2018-12-03 (×3): 2 via ORAL
  Administered 2018-12-04 (×2): 1 via ORAL
  Administered 2018-12-04: 2 via ORAL
  Filled 2018-12-03 (×2): qty 1
  Filled 2018-12-03 (×4): qty 2

## 2018-12-03 MED ORDER — OXYMETAZOLINE HCL 0.05 % NA SOLN
NASAL | Status: AC
  Filled 2018-12-03: qty 15

## 2018-12-03 MED ORDER — BACITRACIN ZINC 500 UNIT/GM EX OINT: TOPICAL_OINTMENT | CUTANEOUS | Status: DC | PRN

## 2018-12-03 MED ORDER — ROCURONIUM BROMIDE 10 MG/ML (PF) SYRINGE
PREFILLED_SYRINGE | INTRAVENOUS | Status: DC | PRN
  Administered 2018-12-03: 50 mg via INTRAVENOUS

## 2018-12-03 MED ORDER — 0.9 % SODIUM CHLORIDE (POUR BTL) OPTIME
TOPICAL | Status: DC | PRN
  Administered 2018-12-03: 1000 mL

## 2018-12-03 MED ORDER — LIDOCAINE 2% (20 MG/ML) 5 ML SYRINGE
INTRAMUSCULAR | Status: AC
  Filled 2018-12-03: qty 5

## 2018-12-03 MED ORDER — CEFAZOLIN SODIUM-DEXTROSE 1-4 GM/50ML-% IV SOLN
1.0000 g | Freq: Three times a day (TID) | INTRAVENOUS | Status: DC
  Administered 2018-12-03 – 2018-12-04 (×3): 1 g via INTRAVENOUS
  Filled 2018-12-03 (×3): qty 50

## 2018-12-03 MED ORDER — MUPIROCIN 2 % EX OINT
TOPICAL_OINTMENT | CUTANEOUS | Status: AC
  Filled 2018-12-03: qty 22

## 2018-12-03 MED ORDER — SODIUM CHLORIDE 0.9 % IR SOLN
Status: DC | PRN
  Administered 2018-12-03: 1000 mL

## 2018-12-03 MED ORDER — LOSARTAN POTASSIUM 50 MG PO TABS
100.0000 mg | ORAL_TABLET | Freq: Every day | ORAL | Status: DC
  Administered 2018-12-03: 100 mg via ORAL
  Filled 2018-12-03: qty 2

## 2018-12-03 MED ORDER — QUININE SULFATE 324 MG PO CAPS
300.0000 mg | ORAL_CAPSULE | Freq: Every day | ORAL | Status: DC
  Filled 2018-12-03: qty 1

## 2018-12-03 MED ORDER — FENTANYL CITRATE (PF) 100 MCG/2ML IJ SOLN
INTRAMUSCULAR | Status: DC | PRN
  Administered 2018-12-03: 100 ug via INTRAVENOUS
  Administered 2018-12-03: 25 ug via INTRAVENOUS

## 2018-12-03 MED ORDER — PHENYLEPHRINE 40 MCG/ML (10ML) SYRINGE FOR IV PUSH (FOR BLOOD PRESSURE SUPPORT)
PREFILLED_SYRINGE | INTRAVENOUS | Status: DC | PRN
  Administered 2018-12-03: 40 ug via INTRAVENOUS

## 2018-12-03 MED ORDER — DEXAMETHASONE SODIUM PHOSPHATE 10 MG/ML IJ SOLN
INTRAMUSCULAR | Status: AC
  Filled 2018-12-03: qty 1

## 2018-12-03 MED ORDER — ONDANSETRON HCL 4 MG PO TABS: 4.0000 mg | ORAL_TABLET | ORAL | Status: DC | PRN

## 2018-12-03 MED ORDER — PROMETHAZINE HCL 25 MG/ML IJ SOLN: 6.2500 mg | INTRAMUSCULAR | Status: DC | PRN

## 2018-12-03 MED ORDER — MUPIROCIN 2 % EX OINT
TOPICAL_OINTMENT | CUTANEOUS | Status: DC | PRN
  Administered 2018-12-03: 1 via TOPICAL

## 2018-12-03 MED ORDER — MAGNESIUM 250 MG PO TABS: 250.0000 mg | ORAL_TABLET | Freq: Every day | ORAL | Status: DC

## 2018-12-03 MED ORDER — KCL IN DEXTROSE-NACL 20-5-0.45 MEQ/L-%-% IV SOLN
INTRAVENOUS | Status: DC
  Administered 2018-12-03 – 2018-12-04 (×2): via INTRAVENOUS
  Filled 2018-12-03 (×2): qty 1000

## 2018-12-03 MED ORDER — ONDANSETRON HCL 4 MG/2ML IJ SOLN: 4.0000 mg | INTRAMUSCULAR | Status: DC | PRN

## 2018-12-03 MED ORDER — HYDROMORPHONE HCL 1 MG/ML IJ SOLN
0.2500 mg | INTRAMUSCULAR | Status: DC | PRN
  Administered 2018-12-03: 0.25 mg via INTRAVENOUS

## 2018-12-03 MED ORDER — CEFAZOLIN SODIUM-DEXTROSE 2-4 GM/100ML-% IV SOLN
INTRAVENOUS | Status: AC
  Filled 2018-12-03: qty 100

## 2018-12-03 MED ORDER — DEXAMETHASONE SODIUM PHOSPHATE 10 MG/ML IJ SOLN
INTRAMUSCULAR | Status: DC | PRN
  Administered 2018-12-03: 5 mg via INTRAVENOUS

## 2018-12-03 SURGICAL SUPPLY — 50 items
BLADE INF TURB ROT M4 2 5PK (BLADE) ×2 IMPLANT
BLADE SURG 15 STRL LF DISP TIS (BLADE) ×1 IMPLANT
BLADE SURG 15 STRL SS (BLADE) ×2
BLADE TRICUT ROTATE M4 4 5PK (BLADE) ×1 IMPLANT
CANISTER SUCT 3000ML (MISCELLANEOUS) ×2 IMPLANT
CANISTER SUCT 3000ML PPV (MISCELLANEOUS) ×2 IMPLANT
CLEANER TIP ELECTROSURG 2X2 (MISCELLANEOUS) ×2 IMPLANT
COAGULATOR SUCT SWTCH 10FR 6 (ELECTROSURGICAL) ×2 IMPLANT
COVER WAND RF STERILE (DRAPES) ×2 IMPLANT
DRAPE HALF SHEET 40X57 (DRAPES) IMPLANT
DRESSING NASAL KENNEDY 3.5X.9 (MISCELLANEOUS) IMPLANT
DRESSING TELFA 8X10 (GAUZE/BANDAGES/DRESSINGS) IMPLANT
DRSG NASAL KENNEDY 3.5X.9 (MISCELLANEOUS)
DRSG TELFA 3X8 NADH (GAUZE/BANDAGES/DRESSINGS) ×2 IMPLANT
ELECT COATED BLADE 2.86 ST (ELECTRODE) ×2 IMPLANT
ELECT REM PT RETURN 9FT ADLT (ELECTROSURGICAL)
ELECTRODE REM PT RTRN 9FT ADLT (ELECTROSURGICAL) IMPLANT
GAUZE SPONGE 2X2 8PLY STRL LF (GAUZE/BANDAGES/DRESSINGS) IMPLANT
GLOVE SS BIOGEL STRL SZ 7.5 (GLOVE) ×1 IMPLANT
GLOVE SUPERSENSE BIOGEL SZ 7.5 (GLOVE) ×1
GOWN STRL REUS W/ TWL LRG LVL3 (GOWN DISPOSABLE) ×1 IMPLANT
GOWN STRL REUS W/ TWL XL LVL3 (GOWN DISPOSABLE) ×1 IMPLANT
GOWN STRL REUS W/TWL LRG LVL3 (GOWN DISPOSABLE) ×2
GOWN STRL REUS W/TWL XL LVL3 (GOWN DISPOSABLE) ×2
KIT BASIN OR (CUSTOM PROCEDURE TRAY) ×2 IMPLANT
KIT TURNOVER KIT B (KITS) ×2 IMPLANT
NDL HYPO 25GX1X1/2 BEV (NEEDLE) IMPLANT
NDL PRECISIONGLIDE 27X1.5 (NEEDLE) ×1 IMPLANT
NEEDLE HYPO 25GX1X1/2 BEV (NEEDLE) IMPLANT
NEEDLE PRECISIONGLIDE 27X1.5 (NEEDLE) ×2 IMPLANT
NS IRRIG 1000ML POUR BTL (IV SOLUTION) ×2 IMPLANT
PAD ARMBOARD 7.5X6 YLW CONV (MISCELLANEOUS) ×4 IMPLANT
PAD DRESSING TELFA 3X8 NADH (GAUZE/BANDAGES/DRESSINGS) IMPLANT
PATTIES SURGICAL .5 X3 (DISPOSABLE) ×2 IMPLANT
PENCIL FOOT CONTROL (ELECTRODE) ×2 IMPLANT
SHEET SILICONE 2X3 0.02 REINF (MISCELLANEOUS) ×1 IMPLANT
SPLINT NASAL DOYLE BI-VL (GAUZE/BANDAGES/DRESSINGS) IMPLANT
SPONGE GAUZE 2X2 STER 10/PKG (GAUZE/BANDAGES/DRESSINGS)
STRIP CLOSURE SKIN 1/2X4 (GAUZE/BANDAGES/DRESSINGS) IMPLANT
SUT CHROMIC 3 0 PS 2 (SUTURE) IMPLANT
SUT CHROMIC 4 0 PS 2 18 (SUTURE) ×1 IMPLANT
SUT CHROMIC 5 0 P 3 (SUTURE) IMPLANT
SUT ETHILON 3 0 PS 1 (SUTURE) IMPLANT
SUT ETHILON 4 0 PS 2 18 (SUTURE) ×1 IMPLANT
SUT ETHILON 5 0 P 3 18 (SUTURE)
SUT NYLON ETHILON 5-0 P-3 1X18 (SUTURE) IMPLANT
SUT SILK 2 0 PERMA HAND 18 BK (SUTURE) ×2 IMPLANT
TRAY ENT MC OR (CUSTOM PROCEDURE TRAY) ×2 IMPLANT
TUBE CONNECTING 12X1/4 (SUCTIONS) ×1 IMPLANT
WATER STERILE IRR 1000ML POUR (IV SOLUTION) ×2 IMPLANT

## 2018-12-03 NOTE — Progress Notes (Signed)
Post Op Check Awake alert without complaints. No significant bleeding. Minimal pain. O2 sats > 90  No airway problems. Stable post op course Plan on removing nasal packing and discharging patient in am.

## 2018-12-03 NOTE — Brief Op Note (Signed)
12/03/2018  9:50 AM  PATIENT:  Stacy Parker  85 y.o. female  PRE-OPERATIVE DIAGNOSIS:  DEVIATED SEPTUM, HYPERTROPY BILATERAL TURBINATES  POST-OPERATIVE DIAGNOSIS:  DEVIATED SEPTUM, HYPERTROPY BILATERAL   PROCEDURE:  Procedure(s): NASAL SEPTOPLASTY WITH BILATERAL TURBINATE REDUCTION (Bilateral)  SURGEON:  Surgeon(s) and Role:    Rozetta Nunnery, MD - Primary  PHYSICIAN ASSISTANT:   ASSISTANTS: none   ANESTHESIA:   general  EBL:  40 cc   BLOOD ADMINISTERED:none  DRAINS: none   LOCAL MEDICATIONS USED:  XYLOCAINE with epi  11 cc  SPECIMEN:  No Specimen  DISPOSITION OF SPECIMEN:  N/A  COUNTS:  YES  TOURNIQUET:  * No tourniquets in log *  DICTATION: .Other Dictation: Dictation Number [id number]  PLAN OF CARE: Admit for overnight observation  PATIENT DISPOSITION:  PACU - hemodynamically stable.   Delay start of Pharmacological VTE agent (>24hrs) due to surgical blood loss or risk of bleeding: yes

## 2018-12-03 NOTE — Anesthesia Procedure Notes (Signed)
Procedure Name: Intubation Date/Time: 12/03/2018 8:10 AM Performed by: Moshe Salisbury, CRNA Pre-anesthesia Checklist: Patient identified, Emergency Drugs available, Suction available and Patient being monitored Patient Re-evaluated:Patient Re-evaluated prior to induction Oxygen Delivery Method: Circle System Utilized Preoxygenation: Pre-oxygenation with 100% oxygen Induction Type: IV induction Ventilation: Mask ventilation without difficulty Laryngoscope Size: Mac and 3 Grade View: Grade II Tube type: Oral Tube size: 7.5 mm Number of attempts: 1 Airway Equipment and Method: Stylet Placement Confirmation: ETT inserted through vocal cords under direct vision,  positive ETCO2 and breath sounds checked- equal and bilateral Secured at: 20 cm Tube secured with: Tape Dental Injury: Teeth and Oropharynx as per pre-operative assessment

## 2018-12-03 NOTE — Anesthesia Postprocedure Evaluation (Signed)
Anesthesia Post Note  Patient: Stacy Parker  Procedure(s) Performed: NASAL SEPTOPLASTY WITH BILATERAL TURBINATE REDUCTION (Bilateral Nose)     Patient location during evaluation: PACU Anesthesia Type: General Level of consciousness: awake and alert Pain management: pain level controlled Vital Signs Assessment: post-procedure vital signs reviewed and stable Respiratory status: spontaneous breathing, nonlabored ventilation, respiratory function stable and patient connected to nasal cannula oxygen Cardiovascular status: blood pressure returned to baseline and stable Postop Assessment: no apparent nausea or vomiting Anesthetic complications: no    Last Vitals:  Vitals:   12/03/18 1131 12/03/18 1202  BP: 135/84 (!) 145/80  Pulse: 73 75  Resp: 13 14  Temp: 36.5 C 36.4 C  SpO2: 92% 94%    Last Pain:  Vitals:   12/03/18 1202  TempSrc: Oral  PainSc:                  Tynia Wiers S

## 2018-12-03 NOTE — Interval H&P Note (Signed)
History and Physical Interval Note:  12/03/2018 7:39 AM  Stacy Parker  has presented today for surgery, with the diagnosis of DEVIATED SEPTUM, HYPERTROPY BILATERAL TURBINATES  The various methods of treatment have been discussed with the patient and family. After consideration of risks, benefits and other options for treatment, the patient has consented to  Procedure(s): NASAL SEPTOPLASTY WITH BILATERAL TURBINATE REDUCTION (Bilateral) as a surgical intervention .  The patient's history has been reviewed, patient examined, no change in status, stable for surgery.  I have reviewed the patient's chart and labs.  Questions were answered to the patient's satisfaction.     [name]

## 2018-12-03 NOTE — Transfer of Care (Signed)
Immediate Anesthesia Transfer of Care Note  Patient: Stacy Parker  Procedure(s) Performed: NASAL SEPTOPLASTY WITH BILATERAL TURBINATE REDUCTION (Bilateral Nose)  Patient Location: PACU  Anesthesia Type:General  Level of Consciousness: drowsy and patient cooperative  Airway & Oxygen Therapy: Patient Spontanous Breathing and Patient connected to face mask oxygen  Post-op Assessment: Report given to RN, Post -op Vital signs reviewed and stable and Patient moving all extremities  Post vital signs: Reviewed and stable  Last Vitals:  Vitals Value Taken Time  BP 152/68 12/03/2018 10:01 AM  Temp    Pulse 83 12/03/2018 10:02 AM  Resp 22 12/03/2018 10:02 AM  SpO2 95 % 12/03/2018 10:02 AM  Vitals shown include unvalidated device data.  Last Pain:  Vitals:   12/03/18 0725  TempSrc:   PainSc: 0-No pain      Patients Stated Pain Goal: 3 (14/10/30 1314)  Complications: No apparent anesthesia complications

## 2018-12-03 NOTE — Plan of Care (Signed)
  Problem: Elimination: Goal: Will not experience complications related to bowel motility Outcome: Progressing Goal: Will not experience complications related to urinary retention Outcome: Progressing   Problem: Pain Managment: Goal: General experience of comfort will improve Outcome: Progressing   Problem: Safety: Goal: Ability to remain free from injury will improve Outcome: Progressing   

## 2018-12-03 NOTE — Op Note (Signed)
NAME: Stacy Parker, SPAUGH MEDICAL RECORD [id number] ACCOUNT [id number] DATE OF BIRTH:1934-03-15 FACILITY: [facility] LOCATION: [facility] PHYSICIAN:CHRISTOPHER Lincoln Maxin, MD  OPERATIVE REPORT  DATE OF PROCEDURE:  12/03/2018  PREOPERATIVE DIAGNOSES:  Septal deviation to the right with recurrent right-sided epistaxis.  Turbinate hypertrophy.  POSTOPERATIVE DIAGNOSES:  Septal deviation to the right with recurrent right-sided epistaxis turbinate hypertrophy.  OPERATION PERFORMED:  Septoplasty with bilateral inferior turbinate reductions with Medtronic turbinate blade.  SURGEON:  [name], MD  ANESTHESIA:  General endotracheal.  COMPLICATIONS:  None.  ESTIMATED BLOOD LOSS:  40 mL.  BRIEF CLINICAL NOTE:  The patient is an 85 year old female who has history of obstructive sleep apnea and uses a nasal CPAP.  She has chronic right-sided nasal obstruction because of the longstanding septal deviation.  More recently, he has been having  recurrent bleeding from the right side of her nose.  On exam, she has a fairly severe anterior cartilaginous septal deviation to the right with crusting scabbing on the right side of the septum.  She is taken to the operating room this time for  septoplasty and turbinate reduction to help improve her nasal airway as well as decrease recurrent nasal bleeds on the right side.  DESCRIPTION OF PROCEDURE:  After adequate endotracheal anesthesia, the patient received 2 grams Ancef IV preoperatively.  Nose was then further prepped with cotton pledgets soaked in Afrin.  The nose was injected with Xylocaine with epinephrine for  hemostasis.  First, a hemitransfixion incision was made along the cartilage of the septum on the right side.  Mucoperichondrial and mucoperiosteal flaps were elevated posteriorly.  The patient had a severe cartilaginous septal deviation to the right and  this severe deviation was removed allowing the septum to return better to midline  anteriorly.  In addition, the patient had a bony partition or deviation to the right also.  After elevating mucoperiosteal flaps on either side of the bony septum  superiorly, a portion of the bony septum was removed to allow the septum to return more to the midline as well as a large septal spur posteriorly on the right side.  This completed the septoplasty portion of the procedure.   Next, using the Medtronic turbinate blade, submucosal reduction of the inferior turbinates was performed bilaterally.  Inspection on the left side.  Turbinates were outfractured and suction cautery was used for hemostasis and to reduce the turbinate more  on the left side.  This completed the procedure.  Hemitransfixion incision was closed with interrupted 4-0 chromic sutures.  The septum was basted with a 4-0 chromic suture.  A thin Silastic splint was secured to either side of the septum with a 4-0  nylon suture.  The nose was then further packed with Telfa soaked in mupirocin 2% ointment.    The patient was awoken from anesthesia and transferred to recovery room  postoperatively doing well.  DISPOSITION:  The patient will be observed for 24-hour observation because of history of sleep apnea.  Pack will removed in the morning and the patient will be discharged home on Keflex 500 mg b.i.d. for a week.  We will plan on leaving the Silastic  splints on either side of the septum to allow the right mucosa to heal up over the next 3 to 4 months.  AN/NUANCE  D:12/03/2018 T:12/03/2018 JOB:[id number]

## 2018-12-04 ENCOUNTER — Encounter (HOSPITAL_COMMUNITY): Payer: Self-pay | Admitting: Otolaryngology

## 2018-12-04 DIAGNOSIS — M1990 Unspecified osteoarthritis, unspecified site: Secondary | ICD-10-CM | POA: Diagnosis not present

## 2018-12-04 DIAGNOSIS — Z87891 Personal history of nicotine dependence: Secondary | ICD-10-CM | POA: Diagnosis not present

## 2018-12-04 DIAGNOSIS — J343 Hypertrophy of nasal turbinates: Secondary | ICD-10-CM | POA: Diagnosis not present

## 2018-12-04 DIAGNOSIS — J342 Deviated nasal septum: Secondary | ICD-10-CM | POA: Diagnosis not present

## 2018-12-04 DIAGNOSIS — R040 Epistaxis: Secondary | ICD-10-CM | POA: Diagnosis not present

## 2018-12-04 DIAGNOSIS — G4733 Obstructive sleep apnea (adult) (pediatric): Secondary | ICD-10-CM | POA: Diagnosis not present

## 2018-12-04 MED ORDER — HYDROCODONE-ACETAMINOPHEN 5-325 MG PO TABS: 1.0000 | ORAL_TABLET | ORAL | 0 refills | Status: DC | PRN

## 2018-12-04 MED ORDER — CEPHALEXIN 500 MG PO CAPS: 500.0000 mg | ORAL_CAPSULE | Freq: Two times a day (BID) | ORAL | 0 refills | Status: DC

## 2018-12-04 NOTE — Progress Notes (Signed)
Discharged to [facility] facility accompanied by patient's husband. Discharged instructions, prescription and personal belongings given to patient. Nasal dressing in situ.verbalized understanding of instructions

## 2018-12-04 NOTE — Discharge Instructions (Signed)
Call office for follow up appt. In 8-9 days  [phone] Saline nasal rinse prn congestion or scabbing Tylenol, ibuprofen or hydrocodone 1 every 4 hrs prn pain Call office if any problems or questions   [phone]

## 2018-12-04 NOTE — Discharge Summary (Signed)
POD 1 AF VSS Patient doing well without complaints. Nasal packing removed with minimal bleeding. Patient discharged home Meds: Keflex 500 mg bid for 8 days, Tylenol, ibuprofen or hydrocodone prn pain Follow up 8-9 days.

## 2019-01-23 DIAGNOSIS — M810 Age-related osteoporosis without current pathological fracture: Secondary | ICD-10-CM | POA: Diagnosis not present

## 2019-01-23 DIAGNOSIS — I10 Essential (primary) hypertension: Secondary | ICD-10-CM | POA: Diagnosis not present

## 2019-01-28 DIAGNOSIS — M1990 Unspecified osteoarthritis, unspecified site: Secondary | ICD-10-CM | POA: Diagnosis not present

## 2019-01-28 DIAGNOSIS — Z1331 Encounter for screening for depression: Secondary | ICD-10-CM | POA: Diagnosis not present

## 2019-01-28 DIAGNOSIS — Z0000 Encounter for general adult medical examination without abnormal findings: Secondary | ICD-10-CM | POA: Diagnosis not present

## 2019-01-28 DIAGNOSIS — G4733 Obstructive sleep apnea (adult) (pediatric): Secondary | ICD-10-CM | POA: Diagnosis not present

## 2019-01-28 DIAGNOSIS — M545 Low back pain: Secondary | ICD-10-CM | POA: Diagnosis not present

## 2019-01-28 DIAGNOSIS — L659 Nonscarring hair loss, unspecified: Secondary | ICD-10-CM | POA: Diagnosis not present

## 2019-01-28 DIAGNOSIS — M810 Age-related osteoporosis without current pathological fracture: Secondary | ICD-10-CM | POA: Diagnosis not present

## 2019-01-28 DIAGNOSIS — I10 Essential (primary) hypertension: Secondary | ICD-10-CM | POA: Diagnosis not present

## 2019-01-28 DIAGNOSIS — K219 Gastro-esophageal reflux disease without esophagitis: Secondary | ICD-10-CM | POA: Diagnosis not present

## 2019-01-28 DIAGNOSIS — M419 Scoliosis, unspecified: Secondary | ICD-10-CM | POA: Diagnosis not present

## 2019-01-28 DIAGNOSIS — Z1339 Encounter for screening examination for other mental health and behavioral disorders: Secondary | ICD-10-CM | POA: Diagnosis not present

## 2019-01-28 DIAGNOSIS — Z86718 Personal history of other venous thrombosis and embolism: Secondary | ICD-10-CM | POA: Diagnosis not present

## 2019-01-30 DIAGNOSIS — Z85828 Personal history of other malignant neoplasm of skin: Secondary | ICD-10-CM | POA: Diagnosis not present

## 2019-01-30 DIAGNOSIS — Z1212 Encounter for screening for malignant neoplasm of rectum: Secondary | ICD-10-CM | POA: Diagnosis not present

## 2019-01-30 DIAGNOSIS — L821 Other seborrheic keratosis: Secondary | ICD-10-CM | POA: Diagnosis not present

## 2019-01-30 DIAGNOSIS — L820 Inflamed seborrheic keratosis: Secondary | ICD-10-CM | POA: Diagnosis not present

## 2019-03-11 DIAGNOSIS — S40819A Abrasion of unspecified upper arm, initial encounter: Secondary | ICD-10-CM | POA: Diagnosis not present

## 2019-04-22 DIAGNOSIS — G894 Chronic pain syndrome: Secondary | ICD-10-CM | POA: Diagnosis not present

## 2019-04-22 DIAGNOSIS — M461 Sacroiliitis, not elsewhere classified: Secondary | ICD-10-CM | POA: Diagnosis not present

## 2019-04-22 DIAGNOSIS — M7918 Myalgia, other site: Secondary | ICD-10-CM | POA: Diagnosis not present

## 2019-04-26 DIAGNOSIS — M461 Sacroiliitis, not elsewhere classified: Secondary | ICD-10-CM | POA: Diagnosis not present

## 2019-04-26 DIAGNOSIS — M7918 Myalgia, other site: Secondary | ICD-10-CM | POA: Diagnosis not present

## 2019-05-01 DIAGNOSIS — M461 Sacroiliitis, not elsewhere classified: Secondary | ICD-10-CM | POA: Diagnosis not present

## 2019-05-15 DIAGNOSIS — L820 Inflamed seborrheic keratosis: Secondary | ICD-10-CM | POA: Diagnosis not present

## 2019-05-15 DIAGNOSIS — D485 Neoplasm of uncertain behavior of skin: Secondary | ICD-10-CM | POA: Diagnosis not present

## 2019-05-15 DIAGNOSIS — L814 Other melanin hyperpigmentation: Secondary | ICD-10-CM | POA: Diagnosis not present

## 2019-05-15 DIAGNOSIS — L218 Other seborrheic dermatitis: Secondary | ICD-10-CM | POA: Diagnosis not present

## 2019-05-15 DIAGNOSIS — Z85828 Personal history of other malignant neoplasm of skin: Secondary | ICD-10-CM | POA: Diagnosis not present

## 2019-05-15 DIAGNOSIS — L720 Epidermal cyst: Secondary | ICD-10-CM | POA: Diagnosis not present

## 2019-05-15 DIAGNOSIS — L928 Other granulomatous disorders of the skin and subcutaneous tissue: Secondary | ICD-10-CM | POA: Diagnosis not present

## 2019-05-15 DIAGNOSIS — C44612 Basal cell carcinoma of skin of right upper limb, including shoulder: Secondary | ICD-10-CM | POA: Diagnosis not present

## 2019-05-15 DIAGNOSIS — L821 Other seborrheic keratosis: Secondary | ICD-10-CM | POA: Diagnosis not present

## 2019-05-16 DIAGNOSIS — G5701 Lesion of sciatic nerve, right lower limb: Secondary | ICD-10-CM | POA: Diagnosis not present

## 2019-05-16 DIAGNOSIS — M461 Sacroiliitis, not elsewhere classified: Secondary | ICD-10-CM | POA: Diagnosis not present

## 2019-06-03 DIAGNOSIS — G5701 Lesion of sciatic nerve, right lower limb: Secondary | ICD-10-CM | POA: Diagnosis not present

## 2019-06-06 DIAGNOSIS — Z5189 Encounter for other specified aftercare: Secondary | ICD-10-CM | POA: Diagnosis not present

## 2019-06-13 DIAGNOSIS — Z85828 Personal history of other malignant neoplasm of skin: Secondary | ICD-10-CM | POA: Diagnosis not present

## 2019-06-13 DIAGNOSIS — L700 Acne vulgaris: Secondary | ICD-10-CM | POA: Diagnosis not present

## 2019-06-13 DIAGNOSIS — C44612 Basal cell carcinoma of skin of right upper limb, including shoulder: Secondary | ICD-10-CM | POA: Diagnosis not present

## 2019-06-13 DIAGNOSIS — L570 Actinic keratosis: Secondary | ICD-10-CM | POA: Diagnosis not present

## 2019-06-13 DIAGNOSIS — L821 Other seborrheic keratosis: Secondary | ICD-10-CM | POA: Diagnosis not present

## 2019-06-27 DIAGNOSIS — H353121 Nonexudative age-related macular degeneration, left eye, early dry stage: Secondary | ICD-10-CM | POA: Diagnosis not present

## 2019-06-27 DIAGNOSIS — H35372 Puckering of macula, left eye: Secondary | ICD-10-CM | POA: Diagnosis not present

## 2019-06-27 DIAGNOSIS — Z961 Presence of intraocular lens: Secondary | ICD-10-CM | POA: Diagnosis not present

## 2019-06-27 DIAGNOSIS — H524 Presbyopia: Secondary | ICD-10-CM | POA: Diagnosis not present

## 2019-06-27 DIAGNOSIS — H5213 Myopia, bilateral: Secondary | ICD-10-CM | POA: Diagnosis not present

## 2019-06-27 DIAGNOSIS — H02411 Mechanical ptosis of right eyelid: Secondary | ICD-10-CM | POA: Diagnosis not present

## 2019-06-27 DIAGNOSIS — H35363 Drusen (degenerative) of macula, bilateral: Secondary | ICD-10-CM | POA: Diagnosis not present

## 2019-06-27 DIAGNOSIS — H52223 Regular astigmatism, bilateral: Secondary | ICD-10-CM | POA: Diagnosis not present

## 2019-07-04 DIAGNOSIS — Z23 Encounter for immunization: Secondary | ICD-10-CM | POA: Diagnosis not present

## 2019-07-04 DIAGNOSIS — M5430 Sciatica, unspecified side: Secondary | ICD-10-CM | POA: Diagnosis not present

## 2019-07-04 DIAGNOSIS — M461 Sacroiliitis, not elsewhere classified: Secondary | ICD-10-CM | POA: Diagnosis not present

## 2019-07-24 DIAGNOSIS — M5416 Radiculopathy, lumbar region: Secondary | ICD-10-CM | POA: Diagnosis not present

## 2019-08-15 DIAGNOSIS — M5416 Radiculopathy, lumbar region: Secondary | ICD-10-CM | POA: Diagnosis not present

## 2019-08-15 DIAGNOSIS — I10 Essential (primary) hypertension: Secondary | ICD-10-CM | POA: Diagnosis not present

## 2019-08-15 DIAGNOSIS — Z6828 Body mass index (BMI) 28.0-28.9, adult: Secondary | ICD-10-CM | POA: Diagnosis not present

## 2019-08-15 DIAGNOSIS — M461 Sacroiliitis, not elsewhere classified: Secondary | ICD-10-CM | POA: Diagnosis not present

## 2019-10-10 DIAGNOSIS — M79675 Pain in left toe(s): Secondary | ICD-10-CM | POA: Diagnosis not present

## 2019-10-14 ENCOUNTER — Ambulatory Visit (INDEPENDENT_AMBULATORY_CARE_PROVIDER_SITE_OTHER): Payer: Medicare Other | Admitting: Neurology

## 2019-10-14 ENCOUNTER — Encounter: Payer: Self-pay | Admitting: Neurology

## 2019-10-14 ENCOUNTER — Other Ambulatory Visit: Payer: Self-pay

## 2019-10-14 VITALS — BP 143/75 | HR 86 | Temp 97.6°F | Ht 61.0 in | Wt 156.0 lb

## 2019-10-14 DIAGNOSIS — G4733 Obstructive sleep apnea (adult) (pediatric): Secondary | ICD-10-CM

## 2019-10-14 DIAGNOSIS — Z9989 Dependence on other enabling machines and devices: Secondary | ICD-10-CM | POA: Diagnosis not present

## 2019-10-14 NOTE — Progress Notes (Signed)
SLEEP MED[facility]INE CLINIC   Provider:  Larey Seat, M D  Primary Care Physician:  Burnard Bunting, MD   Referring Provider: Burnard Bunting, MD    Chief Complaint  Patient presents with  . Follow-up    pt alone, rm 10, pt states things are fine, DME [employer]    HPI:  Stacy Parker is a 85 y.o. female , seen on 10-14-2019. Auto CPAP user with high compliance. Here for once a year RV, her current compliance is 100% with an average of 8 hours exactly.  The patient's minimum pressure is set at 5 cmH2O the maximum at 15 cmH2O and the expiratory pressure relief at 3 cmH2O she has 1.2 residual apnea events still per hour which is an excellent resolution.  Most of these are central apneas.  Her 95th percentile pressure is 11.4 cmH2O and this visit in her current range of pressure settings.  She does have some minor air leakage.  The geriatric depression form was endorsed at only 2 out of 15 possible points the Epworth Sleepiness Scale was endorsed at 4 out of 24 points and the fatigue severity at 13 out of 63 points.  Overall the patient is doing excellent and stated so herself.  I would be happy to see her in a year.     She is seen here in a revisit  from Dr. Reynaldo Minium , needing a new BiPAP machine.  [name] has been an established patient in our practice but I have not seen her in the last 3 years.  She has used positive airway pressure for at least 5 years.  She is still very active going to the [facility], she stopped her citalopram but then went back on after she became a little emotional/ irritable. She also carries a diagnosis of DVT which she developed after back surgery in March 2010, hypertension, GERD, scoliosis, osteoarthritis, she had a thyroid nodule followed at [facility], sacroiliac plasty of August 2010 left hip replacement 2007 right hip replacement 2018 no stimulator implant 2011 and retraction 2017.  Do not have access to her original sleep study right now I will  ask my nurse to find it later however the patient was placed on ASV because of complex sleep apnea, she has a setting of a minimum pressure support of 3, maximum pressure support of 10 cmH2O was 6 cm CPAP her residual AHI is 0.3 and her compliance is excellent between 87 and 93 %/months.  Average use of time is 8 hours and 9 minutes.  Chief complaint according to patient : "I need a new machine - and mine is 83 years old. "  Sleep habits are as follows: He takes 5 mg melatonin before sleep onset. She goes to bed between 10 and 11 usually falls asleep within 30 minutes, she sleeps supine,  On one pillow for neck support. She has usually one bathroom break at night, and she can otherwise sleep through until 6:30 AM when she rises.  She wakes up spontaneously without an alarm.  She reports vivid and adventurous dreams, no nightmares. She feels that there is no difference since she takes melatonin. She reports neither nausea, palpitation, diaphoresis, dry mouth or headaches in the morning.  She feels well rested and refreshed.  Sleep medical history and family sleep history:  See above - iron deficiency anemia, diagnosed by Dr. Rolm Bookbinder, further tested Dr. Ferne Reus. Hairloss.    Social history:  Living with husband in [place], [place] Member .  Non smoker, seldomly drinking alcohol. Caffeine - 2 cups in AM ,   Review of Systems: Out of a complete 14 system review, the patient complains of only the following symptoms, and all other reviewed systems are negative.  Epworth score 3 , Fatigue severity score 18  , depression score - on SSRI, was irritable without.    Social History   Socioeconomic History  . Marital status: Married    Spouse name: Not on file  . Number of children: 2  . Years of education: Not on file  . Highest education level: Not on file  Occupational History  . Occupation: retired    Fish farm manager: RETIRED  Social Needs  . Financial resource strain: Not on  file  . Food insecurity    Worry: Not on file    Inability: Not on file  . Transportation needs    Medical: Not on file    Non-medical: Not on file  Tobacco Use  . Smoking status: Former Smoker    Types: Cigarettes  . Smokeless tobacco: Never Used  . Tobacco comment: QUIT SMOKING IN HER EARLY 40'S  AND WAS NEVER A HEAVY SMOKER  Substance and Sexual Activity  . Alcohol use: Yes    Alcohol/week: 0.0 standard drinks    Comment: rarely  . Drug use: No  . Sexual activity: Never    Birth control/protection: Surgical    Comment: DECLINED INSURCNACE QUESTIONS  Lifestyle  . Physical activity    Days per week: Not on file    Minutes per session: Not on file  . Stress: Not on file  Relationships  . Social Herbalist on phone: Not on file    Gets together: Not on file    Attends religious service: Not on file    Active member of club or organization: Not on file    Attends meetings of clubs or organizations: Not on file    Relationship status: Not on file  . Intimate partner violence    Fear of current or ex partner: Not on file    Emotionally abused: Not on file    Physically abused: Not on file    Forced sexual activity: Not on file  Other Topics Concern  . Not on file  Social History Narrative  . Not on file    Family History  Problem Relation Age of Onset  . Hypertension Mother   . COPD Mother   . Heart disease Father   . Hypertension Father   . Skin cancer Father   . Breast cancer Maternal Grandmother     Past Medical History:  Diagnosis Date  . Anxiety   . Arthritis   . Basal cell carcinoma   . Cervical arthritis    PT STATES CERVICAL SPINE BADLY DEGENERATED--SHE DOES NOT HAVE NECK PAIN -BUT TOLD HER NECK IS FRAGILE  . Complication of anesthesia    myalgia   . Diverticulosis   . DVT (deep venous thrombosis) (HCC)    Left leg  . GERD (gastroesophageal reflux disease)   . H/O hiatal hernia    repairsed  . HTN (hypertension)   . Neuropathy of  foot, left   . OSA on CPAP   . Osteopenia   . Osteoporosis   . Reflux   . Sciatic nerve disease   . Sciatic pain    RIGHT LEG / BACK PAIN- PT ON OXYCONTIN 3 TIMES A DAY--STATES MUST CONTINUE WHILE IN HOSPITAL TO AVOID WITHDRAWAL  . Sleep apnea  04/12/2012   cpap  . Squamous carcinoma     Past Surgical History:  Procedure Laterality Date  . ABDOMINAL HYSTERECTOMY    . BREAST SURGERY     Fibroma, right  . EYE SURGERY     "wrinkle in left eye retina"  . HERNIA REPAIR    . HIATAL HERNIA REPAIR  04/17/2012   Procedure: LAPAROSCOPIC REPAIR OF HIATAL HERNIA;  Surgeon: Pedro Earls, MD;  Location: [facility];  Service: General;  Laterality: N/A;  Large Hiatus Hernia  . INTRAOPERATIVE ARTERIOGRAM    . NASAL SEPTOPLASTY W/ TURBINOPLASTY Bilateral 12/03/2018   Procedure: NASAL SEPTOPLASTY WITH BILATERAL TURBINATE REDUCTION;  Surgeon: Rozetta Nunnery, MD;  Location: [facility];  Service: ENT;  Laterality: Bilateral;  . neuro stimulator     THORACIC AREA-BATTERY IN RIGHT HIP PT TURNS OFF AND ON WITH MAGNET-PT STATES SHE IS PAIN FREE WHEN LYING DOWN -BUT WHEN SITTING OR STANDING SHE EXPERIENCES TINGLING AND PAIN DOWN RIGHT LEG-SCIATIC PAIN  . SPINAL FUSION     8 level  LUMBAR/THORACIC  . TOTAL HIP ARTHROPLASTY     left  . TOTAL HIP ARTHROPLASTY Right 03/29/2017   Procedure: RIGHT TOTAL HIP ARTHROPLASTY ANTERIOR APPROACH;  Surgeon: Gaynelle Arabian, MD;  Location: [facility];  Service: Orthopedics;  Laterality: Right;  . TOTAL VAGINAL HYSTERECTOMY  1993  . TUBAL LIGATION    . verteboplasty      Current Outpatient Medications  Medication Sig Dispense Refill  . aspirin EC 81 MG tablet Take 81 mg by mouth 4 (four) times a week. Sundays, Tuesdays, Thursdays, Saturdays.    Marland Kitchen BIOTIN PO Take 6,000 mcg by mouth daily.    . cephALEXin (KEFLEX) 500 MG capsule Take 1 capsule (500 mg total) by mouth 2 (two) times daily. 16 capsule 0  . cholecalciferol (VITAMIN D) 25 MCG (1000 UT) tablet Take 1,000 Units by  mouth daily.    . citalopram (CELEXA) 40 MG tablet Take 40 mg by mouth daily.    Marland Kitchen docusate sodium (COLACE) 100 MG capsule Take 100 mg by mouth 2 (two) times daily.    Marland Kitchen HYDROcodone-acetaminophen (NORCO) 5-325 MG tablet Take 1 tablet by mouth every 4 (four) hours as needed for moderate pain. 10 tablet 0  . losartan-hydrochlorothiazide (HYZAAR) 100-25 MG tablet Take 1 tablet by mouth every evening.     . Magnesium 250 MG TABS Take 250 mg by mouth daily.    . Melatonin 5 MG TABS Take 5 mg by mouth at bedtime.     . methocarbamol (ROBAXIN) 500 MG tablet Take 500 mg by mouth daily as needed (for muscle spasm/cramps (RARELY USES)).     . Multiple Vitamin (MULTIVITAMIN WITH MINERALS) TABS tablet Take 1 tablet by mouth daily. Senior Multivitamin    . Multiple Vitamins-Minerals (PRESERVISION AREDS 2 PO) Take 1 tablet by mouth 2 (two) times daily.    . Omega-3 Fatty Acids (FISH OIL) 1200 MG CAPS Take 1,200 mg by mouth 2 (two) times daily.    Loma [place] (OYSTER CALCIUM) 500 MG TABS tablet Take 500 mg of elemental calcium by mouth daily.    . pantoprazole (PROTONIX) 40 MG tablet Take 40 mg by mouth at bedtime.     Vladimir Faster Glycol-Propyl Glycol (SYSTANE) 0.4-0.3 % SOLN Place 1 drop into both eyes 2 (two) times daily.    . QUININE SULFATE PO Take 300 mg by mouth at bedtime. For cramps     . Sodium Chloride-Xylitol (XLEAR SINUS CARE SPRAY NA)  Place 2 sprays into the nose at bedtime.     No current facility-administered medications for this visit.     Allergies as of 10/14/2019 - Review Complete 10/14/2019  Allergen Reaction Noted  . Ace inhibitors Other (See Comments) 06/04/2015  . Alendronate sodium  03/28/2018  . Ambien  [zolpidem tartrate] Diarrhea 04/18/2014  . Boniva [ibandronic acid] Other (See Comments) 03/19/2012  . Ceftin  [cefuroxime axetil] Diarrhea and Swelling 04/18/2014  . Cymbalta [duloxetine hcl] Nausea Only 01/05/2012  . Dicloxacillin Diarrhea 01/05/2012  . Doxycycline Other  (See Comments) and Nausea Only   . Metronidazole Nausea And Vomiting   . Phenergan [promethazine hcl] Other (See Comments) 04/18/2014  . Succinylcholine Other (See Comments) 10/19/2016    Vitals: BP (!) 143/75   Pulse 86   Temp 97.6 F (36.4 C)   Ht 5\' 1"  (1.549 m)   Wt 156 lb (70.8 kg)   BMI 29.48 kg/m  Last Weight:  Wt Readings from Last 1 Encounters:  10/14/19 156 lb (70.8 kg)   [id number] mass index is 29.48 kg/m.     Last Height:   Ht Readings from Last 1 Encounters:  10/14/19 5\' 1"  (1.549 m)    Physical exam:  General: The patient is awake, alert and appears not in acute distress. The patient is well groomed. Head: Normocephalic, atraumatic. Neck is supple. Mallampati 2-3 , natural teeth.   neck circumference: 15 . Nasal airflow patent ,  Retrognathia is not seen.  Cardiovascular:  Regular rate and rhythm, without  murmurs or carotid bruit, and without distended neck veins. Respiratory: Lungs are clear to auscultation. Skin:  Without evidence of edema, or rash Trunk: BMI is 28.2. The patient's posture is erect.   Neurologic exam : The patient is awake and alert, oriented to place and time. Attention span & concentration ability appears normal.  Speech is fluent,  without dysarthria, dysphonia or aphasia.  Mood and affect are appropriate.  Cranial nerves: Pupils are equal and briskly reactive to light. Extraocular movements  in vertical and horizontal planes intact and without nystagmus. Visual fields by finger perimetry are intact.Hearing to finger rub intact.  Facial sensation intact to fine touch. Facial motor strength is symmetric and tongue and uvula move midline. Shoulder shrug was symmetrical. Motor exam:  Normal tone, muscle bulk and symmetric strength in all extremities. Sensory:  Fine touch, pinprick and vibration were tested in all extremities.  Coordination:Finger-to-nose maneuver  normal without evidence of ataxia, dysmetria or tremor. Gait and station:  Patient walks without assistive device. Strength within normal limits.Stance is stable and normal- un fragmented turns with 3  Steps.  Deep tendon reflexes: in the  upper and lower extremities are symmetric and intact. Babinski maneuver response is  downgoing.  Assessment:  After physical and neurologic examination, review of laboratory studies,  Personal review of imaging studies, reports of other /same  Imaging studies, results of polysomnography and / or neurophysiology testing and pre-existing records as far as provided in visit., my assessment is   1) History of complex apnea- Dx retried  CPAP,  The patient was advised of the nature of the diagnosed disorder , the treatment options and the  risks for general health and wellness arising from not treating the condition. I spent more than 40 minutes of face to face time with the patient.Greater than 50% of time was spent in counseling and coordination of care. We have discussed the diagnosis and differential and I answered the patient's questions.  Plan:  Treatment plan and additional workup :   Continue current settings on auto CPAP>    Larey Seat, MD [id number], [id number] PM  Certified in Neurology by [employer]BP[employer] Certified in Sleep Medicine by [facility] [address], [place], [place] [address]

## 2019-10-16 DIAGNOSIS — M2012 Hallux valgus (acquired), left foot: Secondary | ICD-10-CM | POA: Diagnosis not present

## 2019-10-16 DIAGNOSIS — M79675 Pain in left toe(s): Secondary | ICD-10-CM | POA: Diagnosis not present

## 2019-10-16 DIAGNOSIS — M2011 Hallux valgus (acquired), right foot: Secondary | ICD-10-CM | POA: Diagnosis not present

## 2019-10-16 DIAGNOSIS — B351 Tinea unguium: Secondary | ICD-10-CM | POA: Diagnosis not present

## 2019-11-20 DIAGNOSIS — Z23 Encounter for immunization: Secondary | ICD-10-CM | POA: Diagnosis not present

## 2019-11-27 DIAGNOSIS — L814 Other melanin hyperpigmentation: Secondary | ICD-10-CM | POA: Diagnosis not present

## 2019-11-27 DIAGNOSIS — L570 Actinic keratosis: Secondary | ICD-10-CM | POA: Diagnosis not present

## 2019-11-27 DIAGNOSIS — L720 Epidermal cyst: Secondary | ICD-10-CM | POA: Diagnosis not present

## 2019-11-27 DIAGNOSIS — Z85828 Personal history of other malignant neoplasm of skin: Secondary | ICD-10-CM | POA: Diagnosis not present

## 2019-11-27 DIAGNOSIS — L821 Other seborrheic keratosis: Secondary | ICD-10-CM | POA: Diagnosis not present

## 2019-11-27 DIAGNOSIS — D2272 Melanocytic nevi of left lower limb, including hip: Secondary | ICD-10-CM | POA: Diagnosis not present

## 2019-12-05 DIAGNOSIS — L600 Ingrowing nail: Secondary | ICD-10-CM | POA: Diagnosis not present

## 2019-12-17 DIAGNOSIS — Z23 Encounter for immunization: Secondary | ICD-10-CM | POA: Diagnosis not present

## 2020-01-27 DIAGNOSIS — I10 Essential (primary) hypertension: Secondary | ICD-10-CM | POA: Diagnosis not present

## 2020-01-27 DIAGNOSIS — M810 Age-related osteoporosis without current pathological fracture: Secondary | ICD-10-CM | POA: Diagnosis not present

## 2020-01-30 DIAGNOSIS — R82998 Other abnormal findings in urine: Secondary | ICD-10-CM | POA: Diagnosis not present

## 2020-01-31 ENCOUNTER — Other Ambulatory Visit: Payer: Self-pay | Admitting: Surgery

## 2020-01-31 DIAGNOSIS — K219 Gastro-esophageal reflux disease without esophagitis: Secondary | ICD-10-CM

## 2020-02-03 DIAGNOSIS — I129 Hypertensive chronic kidney disease with stage 1 through stage 4 chronic kidney disease, or unspecified chronic kidney disease: Secondary | ICD-10-CM | POA: Diagnosis not present

## 2020-02-03 DIAGNOSIS — N1832 Chronic kidney disease, stage 3b: Secondary | ICD-10-CM | POA: Diagnosis not present

## 2020-02-03 DIAGNOSIS — M5380 Other specified dorsopathies, site unspecified: Secondary | ICD-10-CM | POA: Diagnosis not present

## 2020-02-03 DIAGNOSIS — Z0000 Encounter for general adult medical examination without abnormal findings: Secondary | ICD-10-CM | POA: Diagnosis not present

## 2020-02-03 DIAGNOSIS — M810 Age-related osteoporosis without current pathological fracture: Secondary | ICD-10-CM | POA: Diagnosis not present

## 2020-02-03 DIAGNOSIS — M1990 Unspecified osteoarthritis, unspecified site: Secondary | ICD-10-CM | POA: Diagnosis not present

## 2020-02-03 DIAGNOSIS — G4733 Obstructive sleep apnea (adult) (pediatric): Secondary | ICD-10-CM | POA: Diagnosis not present

## 2020-02-03 DIAGNOSIS — M545 Low back pain: Secondary | ICD-10-CM | POA: Diagnosis not present

## 2020-02-03 DIAGNOSIS — K219 Gastro-esophageal reflux disease without esophagitis: Secondary | ICD-10-CM | POA: Diagnosis not present

## 2020-02-05 DIAGNOSIS — Z1212 Encounter for screening for malignant neoplasm of rectum: Secondary | ICD-10-CM | POA: Diagnosis not present

## 2020-02-06 DIAGNOSIS — Z96643 Presence of artificial hip joint, bilateral: Secondary | ICD-10-CM | POA: Diagnosis not present

## 2020-02-06 DIAGNOSIS — Z471 Aftercare following joint replacement surgery: Secondary | ICD-10-CM | POA: Diagnosis not present

## 2020-02-06 DIAGNOSIS — Z96641 Presence of right artificial hip joint: Secondary | ICD-10-CM | POA: Diagnosis not present

## 2020-02-12 ENCOUNTER — Ambulatory Visit
Admission: RE | Admit: 2020-02-12 | Discharge: 2020-02-12 | Disposition: A | Discharge status: Home / Self Care | Payer: Medicare Other | Source: Ambulatory Visit | Attending: Surgery | Admitting: Surgery

## 2020-02-12 DIAGNOSIS — K219 Gastro-esophageal reflux disease without esophagitis: Secondary | ICD-10-CM | POA: Diagnosis not present

## 2020-02-18 DIAGNOSIS — M810 Age-related osteoporosis without current pathological fracture: Secondary | ICD-10-CM | POA: Diagnosis not present

## 2020-02-19 DIAGNOSIS — H35372 Puckering of macula, left eye: Secondary | ICD-10-CM | POA: Diagnosis not present

## 2020-02-19 DIAGNOSIS — H353121 Nonexudative age-related macular degeneration, left eye, early dry stage: Secondary | ICD-10-CM | POA: Diagnosis not present

## 2020-02-19 DIAGNOSIS — H35361 Drusen (degenerative) of macula, right eye: Secondary | ICD-10-CM | POA: Diagnosis not present

## 2020-02-19 DIAGNOSIS — H0288B Meibomian gland dysfunction left eye, upper and lower eyelids: Secondary | ICD-10-CM | POA: Diagnosis not present

## 2020-02-19 DIAGNOSIS — H04123 Dry eye syndrome of bilateral lacrimal glands: Secondary | ICD-10-CM | POA: Diagnosis not present

## 2020-02-19 DIAGNOSIS — H02411 Mechanical ptosis of right eyelid: Secondary | ICD-10-CM | POA: Diagnosis not present

## 2020-02-19 DIAGNOSIS — Z961 Presence of intraocular lens: Secondary | ICD-10-CM | POA: Diagnosis not present

## 2020-02-19 DIAGNOSIS — H0288A Meibomian gland dysfunction right eye, upper and lower eyelids: Secondary | ICD-10-CM | POA: Diagnosis not present

## 2020-02-25 ENCOUNTER — Telehealth: Payer: Self-pay | Admitting: Neurology

## 2020-02-25 NOTE — Telephone Encounter (Signed)
Pt called wanting to know if it would be possible for her to be off her cpap for about 8 nights states she will be traveling and would prefer not to take it on board with her and traveling with her cpap machine states a VM can be left

## 2020-02-25 NOTE — Telephone Encounter (Signed)
Called the patient back. There was no answer. LVM advising from a medical standpoint we can't advise that she go without the machine. LVM informing the patient that she would need to use her own judgement on whether or not she would do well without using the machine. Informed the patient to call back with any questions she may have.

## 2020-03-20 DIAGNOSIS — Z8719 Personal history of other diseases of the digestive system: Secondary | ICD-10-CM | POA: Diagnosis not present

## 2020-03-20 DIAGNOSIS — Z09 Encounter for follow-up examination after completed treatment for conditions other than malignant neoplasm: Secondary | ICD-10-CM | POA: Diagnosis not present

## 2020-03-20 DIAGNOSIS — Z9889 Other specified postprocedural states: Secondary | ICD-10-CM | POA: Diagnosis not present

## 2020-03-25 DIAGNOSIS — M461 Sacroiliitis, not elsewhere classified: Secondary | ICD-10-CM | POA: Diagnosis not present

## 2020-03-25 DIAGNOSIS — I10 Essential (primary) hypertension: Secondary | ICD-10-CM | POA: Diagnosis not present

## 2020-03-25 DIAGNOSIS — Z6828 Body mass index (BMI) 28.0-28.9, adult: Secondary | ICD-10-CM | POA: Diagnosis not present

## 2020-03-25 DIAGNOSIS — M5416 Radiculopathy, lumbar region: Secondary | ICD-10-CM | POA: Diagnosis not present

## 2020-05-28 DIAGNOSIS — D0439 Carcinoma in situ of skin of other parts of face: Secondary | ICD-10-CM | POA: Diagnosis not present

## 2020-05-28 DIAGNOSIS — D1801 Hemangioma of skin and subcutaneous tissue: Secondary | ICD-10-CM | POA: Diagnosis not present

## 2020-05-28 DIAGNOSIS — D485 Neoplasm of uncertain behavior of skin: Secondary | ICD-10-CM | POA: Diagnosis not present

## 2020-05-28 DIAGNOSIS — L814 Other melanin hyperpigmentation: Secondary | ICD-10-CM | POA: Diagnosis not present

## 2020-05-28 DIAGNOSIS — D0462 Carcinoma in situ of skin of left upper limb, including shoulder: Secondary | ICD-10-CM | POA: Diagnosis not present

## 2020-05-28 DIAGNOSIS — L821 Other seborrheic keratosis: Secondary | ICD-10-CM | POA: Diagnosis not present

## 2020-05-28 DIAGNOSIS — D2272 Melanocytic nevi of left lower limb, including hip: Secondary | ICD-10-CM | POA: Diagnosis not present

## 2020-05-28 DIAGNOSIS — Z85828 Personal history of other malignant neoplasm of skin: Secondary | ICD-10-CM | POA: Diagnosis not present

## 2020-06-23 DIAGNOSIS — D0439 Carcinoma in situ of skin of other parts of face: Secondary | ICD-10-CM | POA: Diagnosis not present

## 2020-06-23 DIAGNOSIS — Z85828 Personal history of other malignant neoplasm of skin: Secondary | ICD-10-CM | POA: Diagnosis not present

## 2020-08-12 DIAGNOSIS — Z23 Encounter for immunization: Secondary | ICD-10-CM | POA: Diagnosis not present

## 2020-08-20 DIAGNOSIS — H35363 Drusen (degenerative) of macula, bilateral: Secondary | ICD-10-CM | POA: Diagnosis not present

## 2020-08-20 DIAGNOSIS — H35372 Puckering of macula, left eye: Secondary | ICD-10-CM | POA: Diagnosis not present

## 2020-08-20 DIAGNOSIS — H04123 Dry eye syndrome of bilateral lacrimal glands: Secondary | ICD-10-CM | POA: Diagnosis not present

## 2020-08-20 DIAGNOSIS — H353121 Nonexudative age-related macular degeneration, left eye, early dry stage: Secondary | ICD-10-CM | POA: Diagnosis not present

## 2020-09-17 DIAGNOSIS — H353120 Nonexudative age-related macular degeneration, left eye, stage unspecified: Secondary | ICD-10-CM | POA: Diagnosis not present

## 2020-10-14 ENCOUNTER — Other Ambulatory Visit: Payer: Self-pay

## 2020-10-14 ENCOUNTER — Encounter: Payer: Self-pay | Admitting: Neurology

## 2020-10-14 ENCOUNTER — Ambulatory Visit (INDEPENDENT_AMBULATORY_CARE_PROVIDER_SITE_OTHER): Payer: Medicare Other | Admitting: Neurology

## 2020-10-14 VITALS — BP 138/72 | HR 86 | Ht 63.0 in | Wt 160.0 lb

## 2020-10-14 DIAGNOSIS — Z9989 Dependence on other enabling machines and devices: Secondary | ICD-10-CM | POA: Diagnosis not present

## 2020-10-14 DIAGNOSIS — G4733 Obstructive sleep apnea (adult) (pediatric): Secondary | ICD-10-CM

## 2020-10-14 NOTE — Progress Notes (Signed)
SLEEP MEDICINE CLINIC   Provider:  Larey Seat, M D  Primary Care Physician:  Burnard Bunting, MD   Referring Provider: Burnard Bunting, MD    Chief Complaint  Patient presents with  . Follow-up    pt alone, rm 11. presents for yearly follow up visit. no issues or concerns. DME adapt    HPI:  Stacy Parker is a 86 y.o. female , seen on 10-14-2020. Here in good spirits and feeling fine.  She had some concerns on the side about her husband who has been diagnosed with essential tremors at age 41 and she had wondered what medications or other therapies that may be available would be slightly discussed this.  As to her sleep problem her apnea is excellently controlled she has used the machine 100% of the last 30 days every day over 4 hours with an average of 8 hours 40 minutes.  She is using an AutoSet with a minimum pressure of 5 maximum pressure of 15 and 3 cm EPR, her residual apnea hypopnea index is 0.9/h this is an excellent resolution. She does have quite a large air leak with the 95th percentile being 32 L however it does not create apneas in her case her 95th percentile pressure is 11.7 cmH2O and is currently covered with the settings of her machine.  I need not to make any adjustments if she should request a different mask fit or needs any other advice as to maintenance I will be happy to provide information even over the phone or by my chart.     seen on 10-14-2019. Auto CPAP user with high compliance. Here for once a year RV, her current compliance is 100% with an average of 8 hours exactly.  The patient's minimum pressure is set at 5 cmH2O the maximum at 15 cmH2O and the expiratory pressure relief at 3 cmH2O she has 1.2 residual apnea events still per hour which is an excellent resolution.  Most of these are central apneas.  Her 95th percentile pressure is 11.4 cmH2O and this visit in her current range of pressure settings.  She does have some minor air leakage.  The  geriatric depression form was endorsed at only 2 out of 15 possible points the Epworth Sleepiness Scale was endorsed at 4 out of 24 points and the fatigue severity at 13 out of 63 points.  Overall the patient is doing excellent and stated so herself.  I would be happy to see her in a year.     She is seen here in a revisit  from Dr. Reynaldo Minium , needing a new BiPAP machine.  Stacy Parker has been an established patient in our practice but I have not seen her in the last 3 years.  She has used positive airway pressure for at least 5 years.  She is still very active going to the [facility], she stopped her citalopram but then went back on after she became a little emotional/ irritable. She also carries a diagnosis of DVT which she developed after back surgery in March 2010, hypertension, GERD, scoliosis, osteoarthritis, she had a thyroid nodule followed at [facility], sacroiliac plasty of August 2010 left hip replacement 2007 right hip replacement 2018 no stimulator implant 2011 and retraction 2017.  Do not have access to her original sleep study right now I will ask my nurse to find it later however the patient was placed on ASV because of complex sleep apnea, she has a setting of a minimum pressure  support of 3, maximum pressure support of 10 cmH2O was 6 cm CPAP her residual AHI is 0.3 and her compliance is excellent between 87 and 93 %/months.  Average use of time is 8 hours and 9 minutes.  Chief complaint according to patient : "I need a new machine - and mine is 84 years old. "  Sleep habits are as follows: He takes 5 mg melatonin before sleep onset. She goes to bed between 10 and 11 usually falls asleep within 30 minutes, she sleeps supine,  On one pillow for neck support. She has usually one bathroom break at night, and she can otherwise sleep through until 6:30 AM when she rises.  She wakes up spontaneously without an alarm.  She reports vivid and adventurous dreams, no nightmares. She feels that  there is no difference since she takes melatonin. She reports neither nausea, palpitation, diaphoresis, dry mouth or headaches in the morning.  She feels well rested and refreshed.  Sleep medical history and family sleep history:  See above - iron deficiency anemia, diagnosed by Dr. Rolm Bookbinder, further tested Dr. Ferne Reus. Hairloss.    Social history:  Living with husband in [place], [place] .  Non smoker, seldomly drinking alcohol. Caffeine - 2 cups in AM ,   Review of Systems: Out of a complete 14 system review, the patient complains of only the following symptoms, and all other reviewed systems are negative.  Epworth score 3 , Fatigue severity score 18  , depression score - on SSRI, was irritable without.    Social History   Socioeconomic History  . Marital status: Married    Spouse name: Not on file  . Number of children: 2  . Years of education: Not on file  . Highest education level: Not on file  Occupational History  . Occupation: retired    Fish farm manager: RETIRED  Tobacco Use  . Smoking status: Former Smoker    Types: Cigarettes  . Smokeless tobacco: Never Used  . Tobacco comment: QUIT SMOKING IN HER EARLY 40'S  AND WAS NEVER A HEAVY SMOKER  Vaping Use  . Vaping Use: Never used  Substance and Sexual Activity  . Alcohol use: Yes    Alcohol/week: 0.0 standard drinks    Comment: rarely  . Drug use: No  . Sexual activity: Never    Birth control/protection: Surgical    Comment: DECLINED INSURCNACE QUESTIONS  Other Topics Concern  . Not on file  Social History Narrative  . Not on file   Social Determinants of Health   Financial Resource Strain:   . Difficulty of Paying Living Expenses: Not on file  Food Insecurity:   . Worried About Charity fundraiser in the Last Year: Not on file  . Ran Out of Food in the Last Year: Not on file  Transportation Needs:   . Lack of Transportation (Medical): Not on file  . Lack of Transportation  (Non-Medical): Not on file  Physical Activity:   . Days of Exercise per Week: Not on file  . Minutes of Exercise per Session: Not on file  Stress:   . Feeling of Stress : Not on file  Social Connections:   . Frequency of Communication with Friends and Family: Not on file  . Frequency of Social Gatherings with Friends and Family: Not on file  . Attends Religious Services: Not on file  . Active Member of Clubs or Organizations: Not on file  . Attends Archivist Meetings: Not  on file  . Marital Status: Not on file  Intimate Partner Violence:   . Fear of Current or Ex-Partner: Not on file  . Emotionally Abused: Not on file  . Physically Abused: Not on file  . Sexually Abused: Not on file    Family History  Problem Relation Age of Onset  . Hypertension Mother   . COPD Mother   . Heart disease Father   . Hypertension Father   . Skin cancer Father   . Breast cancer Maternal Grandmother     Past Medical History:  Diagnosis Date  . Anxiety   . Arthritis   . Basal cell carcinoma   . Cervical arthritis    PT STATES CERVICAL SPINE BADLY DEGENERATED--SHE DOES NOT HAVE NECK PAIN -BUT TOLD HER NECK IS FRAGILE  . Complication of anesthesia    myalgia   . Diverticulosis   . DVT (deep venous thrombosis) (HCC)    Left leg  . GERD (gastroesophageal reflux disease)   . H/O hiatal hernia    repairsed  . HTN (hypertension)   . Neuropathy of foot, left   . OSA on CPAP   . Osteopenia   . Osteoporosis   . Reflux   . Sciatic nerve disease   . Sciatic pain    RIGHT LEG / BACK PAIN- PT ON OXYCONTIN 3 TIMES A DAY--STATES MUST CONTINUE WHILE IN HOSPITAL TO AVOID WITHDRAWAL  . Sleep apnea 04/12/2012   cpap  . Squamous carcinoma     Past Surgical History:  Procedure Laterality Date  . ABDOMINAL HYSTERECTOMY    . BREAST SURGERY     Fibroma, right  . EYE SURGERY     "wrinkle in left eye retina"  . HERNIA REPAIR    . HIATAL HERNIA REPAIR  04/17/2012   Procedure: LAPAROSCOPIC  REPAIR OF HIATAL HERNIA;  Surgeon: Pedro Earls, MD;  Location: [facility];  Service: General;  Laterality: N/A;  Large Hiatus Hernia  . INTRAOPERATIVE ARTERIOGRAM    . NASAL SEPTOPLASTY W/ TURBINOPLASTY Bilateral 12/03/2018   Procedure: NASAL SEPTOPLASTY WITH BILATERAL TURBINATE REDUCTION;  Surgeon: Rozetta Nunnery, MD;  Location: [facility];  Service: ENT;  Laterality: Bilateral;  . neuro stimulator     THORACIC AREA-BATTERY IN RIGHT HIP PT TURNS OFF AND ON WITH MAGNET-PT STATES SHE IS PAIN FREE WHEN LYING DOWN -BUT WHEN SITTING OR STANDING SHE EXPERIENCES TINGLING AND PAIN DOWN RIGHT LEG-SCIATIC PAIN  . SPINAL FUSION     8 level  LUMBAR/THORACIC  . TOTAL HIP ARTHROPLASTY     left  . TOTAL HIP ARTHROPLASTY Right 03/29/2017   Procedure: RIGHT TOTAL HIP ARTHROPLASTY ANTERIOR APPROACH;  Surgeon: Gaynelle Arabian, MD;  Location: [facility];  Service: Orthopedics;  Laterality: Right;  . TOTAL VAGINAL HYSTERECTOMY  1993  . TUBAL LIGATION    . verteboplasty      Current Outpatient Medications  Medication Sig Dispense Refill  . aspirin EC 81 MG tablet Take 81 mg by mouth 4 (four) times a week. Sundays, Tuesdays, Thursdays, Saturdays.    Marland Kitchen BIOTIN PO Take 6,000 mcg by mouth daily.    . cephALEXin (KEFLEX) 500 MG capsule Take 1 capsule (500 mg total) by mouth 2 (two) times daily. 16 capsule 0  . cholecalciferol (VITAMIN D) 25 MCG (1000 UT) tablet Take 1,000 Units by mouth daily.    . citalopram (CELEXA) 40 MG tablet Take 40 mg by mouth daily.    Marland Kitchen docusate sodium (COLACE) 100 MG capsule Take 100 mg  by mouth 2 (two) times daily.    Marland Kitchen HYDROcodone-acetaminophen (NORCO) 5-325 MG tablet Take 1 tablet by mouth every 4 (four) hours as needed for moderate pain. 10 tablet 0  . losartan-hydrochlorothiazide (HYZAAR) 100-25 MG tablet Take 1 tablet by mouth every evening.     . Magnesium 250 MG TABS Take 250 mg by mouth daily.    . Melatonin 5 MG TABS Take 5 mg by mouth at bedtime.     . methocarbamol (ROBAXIN) 500  MG tablet Take 500 mg by mouth daily as needed (for muscle spasm/cramps (RARELY USES)).     . Multiple Vitamin (MULTIVITAMIN WITH MINERALS) TABS tablet Take 1 tablet by mouth daily. Senior Multivitamin    . Multiple Vitamins-Minerals (PRESERVISION AREDS 2 PO) Take 1 tablet by mouth 2 (two) times daily.    . Omega-3 Fatty Acids (FISH OIL) 1200 MG CAPS Take 1,200 mg by mouth 2 (two) times daily.    [place] (OYSTER CALCIUM) 500 MG TABS tablet Take 500 mg of elemental calcium by mouth daily.    Vladimir Faster Glycol-Propyl Glycol (SYSTANE) 0.4-0.3 % SOLN Place 1 drop into both eyes 2 (two) times daily.    . QUININE SULFATE PO Take 300 mg by mouth at bedtime. For cramps     . Sodium Chloride-Xylitol (XLEAR SINUS CARE SPRAY NA) Place 2 sprays into the nose at bedtime.    . pantoprazole (PROTONIX) 40 MG tablet Take 40 mg by mouth at bedtime.      No current facility-administered medications for this visit.    Allergies as of 10/14/2020 - Review Complete 10/14/2020  Allergen Reaction Noted  . Ace inhibitors Other (See Comments) 06/04/2015  . Alendronate sodium  03/28/2018  . Ambien  [zolpidem tartrate] Diarrhea 04/18/2014  . Boniva [ibandronic acid] Other (See Comments) 03/19/2012  . Ceftin  [cefuroxime axetil] Diarrhea and Swelling 04/18/2014  . Cymbalta [duloxetine hcl] Nausea Only 01/05/2012  . Dicloxacillin Diarrhea 01/05/2012  . Doxycycline Other (See Comments) and Nausea Only   . Metronidazole Nausea And Vomiting   . Phenergan [promethazine hcl] Other (See Comments) 04/18/2014  . Succinylcholine Other (See Comments) 10/19/2016    Vitals: BP 138/72   Pulse 86   Ht 5\' 3"  (1.6 m)   Wt 160 lb (72.6 kg)   BMI 28.34 kg/m  Last Weight:  Wt Readings from Last 1 Encounters:  10/14/20 160 lb (72.6 kg)   KGU:RKYH mass index is 28.34 kg/m.     Last Height:   Ht Readings from Last 1 Encounters:  10/14/20 5\' 3"  (1.6 m)    Physical exam:  General: The patient is awake, alert and  appears not in acute distress. The patient is well groomed. Head: Normocephalic, atraumatic. Neck is supple. Mallampati 2-3 , natural teeth.   neck circumference: 15 . Nasal airflow patent ,  Retrognathia is not seen.  Cardiovascular:  Regular rate and rhythm, without  murmurs or carotid bruit, and without distended neck veins. Respiratory: Lungs are clear to auscultation. Skin:  Without evidence of edema, or rash Trunk: BMI is 28.2. The patient's posture is erect.   Neurologic exam : The patient is awake and alert, oriented to place and time. Attention span & concentration ability appears normal.  Speech is fluent,  without dysarthria, dysphonia or aphasia.  Mood and affect are appropriate.  Cranial nerves: Pupils are equal and briskly reactive to light. Extraocular movements  in vertical and horizontal planes intact and without nystagmus. Visual fields by finger perimetry are  intact.Hearing to finger rub intact.  Facial sensation intact to fine touch. Facial motor strength is symmetric and tongue and uvula move midline. Shoulder shrug was symmetrical. Motor exam:  Normal tone, muscle bulk and symmetric strength in all extremities. Sensory:  Fine touch, pinprick and vibration were tested in all extremities.  Coordination:Finger-to-nose maneuver  normal without evidence of ataxia, dysmetria or tremor. Gait and station: Patient walks without assistive device. Strength within normal limits.Stance is stable and normal- un fragmented turns with 3  Steps.  Deep tendon reflexes: in the  upper and lower extremities are symmetric and intact. Babinski maneuver response is  downgoing.  Assessment:  After physical and neurologic examination, review of laboratory studies,  Personal review of imaging studies, reports of other /same  Imaging studies, results of polysomnography and / or neurophysiology testing and pre-existing records as far as provided in visit., my assessment is   1) History of complex  apnea- on CPAP, and 100 5 compliant , residual AHI was 0.9/h.   Plan:  Treatment plan and additional workup :   Continue current settings on auto CPAP> this is an excellent result.  She declined the offer of a bella swift mask with more sturdy headgear.      Larey Seat, MD 04/13/7033, 0:35 PM  Certified in Neurology by ABPN Certified in [place] by [facility] [address], [place], [place] [address]

## 2020-11-23 ENCOUNTER — Telehealth (INDEPENDENT_AMBULATORY_CARE_PROVIDER_SITE_OTHER): Payer: Self-pay | Admitting: Otolaryngology

## 2020-11-23 NOTE — Telephone Encounter (Signed)
Patient called thinking she might have mastoiditis as she has some pain in the ear when she chews and has some pain beneath and behind the ear.  She is not having any hearing problems.  Pain is mild presently. Recommended initially taking NSAID for the pain. If the pain worsens or she notices any hearing problems would follow-up with her medical doctor as I will be unable to see her in the next 10 days.

## 2020-11-25 DIAGNOSIS — Z1231 Encounter for screening mammogram for malignant neoplasm of breast: Secondary | ICD-10-CM | POA: Diagnosis not present

## 2020-12-02 DIAGNOSIS — D225 Melanocytic nevi of trunk: Secondary | ICD-10-CM | POA: Diagnosis not present

## 2020-12-02 DIAGNOSIS — D485 Neoplasm of uncertain behavior of skin: Secondary | ICD-10-CM | POA: Diagnosis not present

## 2020-12-02 DIAGNOSIS — L820 Inflamed seborrheic keratosis: Secondary | ICD-10-CM | POA: Diagnosis not present

## 2020-12-02 DIAGNOSIS — Z85828 Personal history of other malignant neoplasm of skin: Secondary | ICD-10-CM | POA: Diagnosis not present

## 2020-12-02 DIAGNOSIS — L821 Other seborrheic keratosis: Secondary | ICD-10-CM | POA: Diagnosis not present

## 2020-12-18 DIAGNOSIS — M7918 Myalgia, other site: Secondary | ICD-10-CM | POA: Diagnosis not present

## 2020-12-18 DIAGNOSIS — M47816 Spondylosis without myelopathy or radiculopathy, lumbar region: Secondary | ICD-10-CM | POA: Diagnosis not present

## 2020-12-18 DIAGNOSIS — M461 Sacroiliitis, not elsewhere classified: Secondary | ICD-10-CM | POA: Diagnosis not present

## 2020-12-18 DIAGNOSIS — M5416 Radiculopathy, lumbar region: Secondary | ICD-10-CM | POA: Diagnosis not present

## 2020-12-22 DIAGNOSIS — M6281 Muscle weakness (generalized): Secondary | ICD-10-CM | POA: Diagnosis not present

## 2020-12-22 DIAGNOSIS — M546 Pain in thoracic spine: Secondary | ICD-10-CM | POA: Diagnosis not present

## 2020-12-22 DIAGNOSIS — M47816 Spondylosis without myelopathy or radiculopathy, lumbar region: Secondary | ICD-10-CM | POA: Diagnosis not present

## 2020-12-22 DIAGNOSIS — M1990 Unspecified osteoarthritis, unspecified site: Secondary | ICD-10-CM | POA: Diagnosis not present

## 2020-12-22 DIAGNOSIS — G629 Polyneuropathy, unspecified: Secondary | ICD-10-CM | POA: Diagnosis not present

## 2020-12-22 DIAGNOSIS — M545 Low back pain, unspecified: Secondary | ICD-10-CM | POA: Diagnosis not present

## 2020-12-28 DIAGNOSIS — I10 Essential (primary) hypertension: Secondary | ICD-10-CM | POA: Diagnosis not present

## 2020-12-28 DIAGNOSIS — M1990 Unspecified osteoarthritis, unspecified site: Secondary | ICD-10-CM | POA: Diagnosis not present

## 2020-12-28 DIAGNOSIS — N1832 Chronic kidney disease, stage 3b: Secondary | ICD-10-CM | POA: Diagnosis not present

## 2020-12-28 DIAGNOSIS — I129 Hypertensive chronic kidney disease with stage 1 through stage 4 chronic kidney disease, or unspecified chronic kidney disease: Secondary | ICD-10-CM | POA: Diagnosis not present

## 2020-12-28 DIAGNOSIS — K219 Gastro-esophageal reflux disease without esophagitis: Secondary | ICD-10-CM | POA: Diagnosis not present

## 2021-01-04 ENCOUNTER — Telehealth: Payer: Self-pay | Admitting: Physician Assistant

## 2021-01-04 NOTE — Telephone Encounter (Signed)
Patient with a few week hx of "dark, sticky, pasty stools".  She will come in and see Dr. Henrene Pastor on 01/07/21 at 9:00

## 2021-01-04 NOTE — Telephone Encounter (Signed)
Pt is requesting a call back from a nurse to discuss her "pasty and dark" stools and rarely sees fresh blood, pt would like to know if she should be concerned.

## 2021-01-07 ENCOUNTER — Other Ambulatory Visit: Payer: Self-pay

## 2021-01-07 ENCOUNTER — Encounter: Payer: Self-pay | Admitting: Internal Medicine

## 2021-01-07 ENCOUNTER — Other Ambulatory Visit (INDEPENDENT_AMBULATORY_CARE_PROVIDER_SITE_OTHER): Payer: Medicare Other

## 2021-01-07 ENCOUNTER — Ambulatory Visit (INDEPENDENT_AMBULATORY_CARE_PROVIDER_SITE_OTHER): Payer: Medicare Other | Admitting: Internal Medicine

## 2021-01-07 VITALS — BP 150/74 | HR 77 | Ht 63.0 in | Wt 157.0 lb

## 2021-01-07 DIAGNOSIS — K625 Hemorrhage of anus and rectum: Secondary | ICD-10-CM

## 2021-01-07 DIAGNOSIS — K649 Unspecified hemorrhoids: Secondary | ICD-10-CM

## 2021-01-07 DIAGNOSIS — R194 Change in bowel habit: Secondary | ICD-10-CM

## 2021-01-07 LAB — CBC WITH DIFFERENTIAL/PLATELET
Basophils Absolute: 0.1 10*3/uL (ref 0.0–0.1)
Basophils Relative: 1.1 % (ref 0.0–3.0)
Eosinophils Absolute: 0.1 10*3/uL (ref 0.0–0.7)
Eosinophils Relative: 1.4 % (ref 0.0–5.0)
HCT: 41.7 % (ref 36.0–46.0)
Hemoglobin: 13.8 g/dL (ref 12.0–15.0)
Lymphocytes Relative: 21.6 % (ref 12.0–46.0)
Lymphs Abs: 1.4 10*3/uL (ref 0.7–4.0)
MCHC: 33 g/dL (ref 30.0–36.0)
MCV: 97.8 fl (ref 78.0–100.0)
Monocytes Absolute: 0.9 10*3/uL (ref 0.1–1.0)
Monocytes Relative: 13.5 % — ABNORMAL HIGH (ref 3.0–12.0)
Neutro Abs: 4.2 10*3/uL (ref 1.4–7.7)
Neutrophils Relative %: 62.4 % (ref 43.0–77.0)
Platelets: 231 10*3/uL (ref 150.0–400.0)
RBC: 4.26 Mil/uL (ref 3.87–5.11)
RDW: 13.9 % (ref 11.5–15.5)
WBC: 6.7 10*3/uL (ref 4.0–10.5)

## 2021-01-07 MED ORDER — HYDROCORTISONE (PERIANAL) 2.5 % EX CREA: 1.0000 "application " | TOPICAL_CREAM | Freq: Every day | CUTANEOUS | 1 refills | Status: DC

## 2021-01-07 NOTE — Patient Instructions (Signed)
Your provider has requested that you go to the basement level for lab work before leaving today. Press "B" on the elevator. The lab is located at the first door on the left as you exit the elevator.  We have sent the following medications to your pharmacy for you to pick up at your convenience:  Anusol [facility] Cream  Purchase Preparation H suppositories over the counter.  Apply a small amount of the cream on the suppository and insert rectally at bedtime.  Take 2 tablespoons of Metamucil daily

## 2021-01-07 NOTE — Progress Notes (Signed)
HISTORY OF PRESENT ILLNESS:  Stacy Parker is a 87 y.o. female who scheduled this appointment with concerns over change in bowels and rectal bleeding.  She has not been seen in this office since 2017.  She tells me that couple weeks ago she noticed that her bowels seem to be a bit thinner than normal and the movements were a pasty consistency.  She describes some occasions where it seemed difficult to evacuate with a sensation of rectal fullness.  At this time she would notice bright red blood on the tissue when padding her bottom.  She denies abdominal pain, weight loss or blood mixed with the stool.  She does have a history of adenomatous colon polyps remotely.  Last colonoscopy was performed January 2010.  The examination was incomplete due to high-grade sigmoid colon stricturing secondary to diverticulosis.  Subsequent air-contrast barium enema revealed diverticular disease of the sigmoid colon with narrowed caliber.  As well scattered diverticula.  No other abnormalities.  Patient tells me that despite the aforementioned changes, she does have daily bowel movements.  She does have a history of GERD with asymptomatic peptic stricture for which she takes pantoprazole daily.  Last upper endoscopy also performed in 2010.  She has completed her COVID vaccination series and posterior  REVIEW OF SYSTEMS:  All non-GI ROS negative unless otherwise stated in the HPI except for arthritis, back pain, visual change  Past Medical History:  Diagnosis Date  . Anxiety   . Arthritis   . Basal cell carcinoma   . Cervical arthritis    PT STATES CERVICAL SPINE BADLY DEGENERATED--SHE DOES NOT HAVE NECK PAIN -BUT TOLD HER NECK IS FRAGILE  . Complication of anesthesia    myalgia   . Diverticulosis   . DVT (deep venous thrombosis) (HCC)    Left leg  . GERD (gastroesophageal reflux disease)   . H/O hiatal hernia    repairsed  . HTN (hypertension)   . Neuropathy of foot, left   . OSA on CPAP   . Osteopenia    . Osteoporosis   . Reflux   . Sciatic nerve disease   . Sciatic pain    RIGHT LEG / BACK PAIN- PT ON OXYCONTIN 3 TIMES A DAY--STATES MUST CONTINUE WHILE IN HOSPITAL TO AVOID WITHDRAWAL  . Sleep apnea 04/12/2012   cpap  . Squamous carcinoma     Past Surgical History:  Procedure Laterality Date  . ABDOMINAL HYSTERECTOMY    . BREAST SURGERY     Fibroma, right  . EYE SURGERY     "wrinkle in left eye retina"  . HERNIA REPAIR    . HIATAL HERNIA REPAIR  04/17/2012   Procedure: LAPAROSCOPIC REPAIR OF HIATAL HERNIA;  Surgeon: Pedro Earls, MD;  Location: [facility];  Service: General;  Laterality: N/A;  Large Hiatus Hernia  . INTRAOPERATIVE ARTERIOGRAM    . NASAL SEPTOPLASTY W/ TURBINOPLASTY Bilateral 12/03/2018   Procedure: NASAL SEPTOPLASTY WITH BILATERAL TURBINATE REDUCTION;  Surgeon: Rozetta Nunnery, MD;  Location: [facility];  Service: ENT;  Laterality: Bilateral;  . neuro stimulator     THORACIC AREA-BATTERY IN RIGHT HIP PT TURNS OFF AND ON WITH MAGNET-PT STATES SHE IS PAIN FREE WHEN LYING DOWN -BUT WHEN SITTING OR STANDING SHE EXPERIENCES TINGLING AND PAIN DOWN RIGHT LEG-SCIATIC PAIN  . SPINAL FUSION     8 level  LUMBAR/THORACIC  . TOTAL HIP ARTHROPLASTY     left  . TOTAL HIP ARTHROPLASTY Right 03/29/2017   Procedure: RIGHT TOTAL  HIP ARTHROPLASTY ANTERIOR APPROACH;  Surgeon: Gaynelle Arabian, MD;  Location: [facility];  Service: Orthopedics;  Laterality: Right;  . TOTAL VAGINAL HYSTERECTOMY  1993  . TUBAL LIGATION    . verteboplasty      Social History Stacy Parker  reports that she has quit smoking. Her smoking use included cigarettes. She has never used smokeless tobacco. She reports current alcohol use. She reports that she does not use drugs.  family history includes Breast cancer in her maternal grandmother; COPD in her mother; Heart disease in her father; Hypertension in her father and mother; Skin cancer in her father.  Allergies  Allergen Reactions  . Ace Inhibitors  Other (See Comments)    Unsure of what reaction was  . Alendronate Sodium   . Ambien  [Zolpidem Tartrate] Diarrhea  . Boniva [Ibandronic Acid] Other (See Comments)    Flu like symptoms STATES ALL THE OSTEOPOROSIS DRUGS HAVE CAUSED FLU LIKE SYMPTOMS   . Ceftin  [Cefuroxime Axetil] Diarrhea and Swelling  . Cymbalta [Duloxetine Hcl] Nausea Only  . Dicloxacillin Diarrhea    Has patient had a PCN reaction causing immediate rash, facial/tongue/throat swelling, SOB or lightheadedness with hypotension:unsure Has patient had a PCN reaction causing severe rash involving mucus membranes or skin necrosis:unsure Has patient had a PCN reaction that required hospitalization:NO Has patient had a PCN reaction occurring within the last 10 years:unsure If all of the above answers are "NO", then may proceed with Cephalosporin use.   Marland Kitchen Doxycycline Other (See Comments) and Nausea Only    unknown  . Metronidazole Nausea And Vomiting  . Phenergan [Promethazine Hcl] Other (See Comments)    Other Reaction: CNS Disorder Cough/nausea/sweating   . Succinylcholine Other (See Comments)    Muscle soreness all over body post operatively ("felt like I was hit by [employer] truck")       PHYSICAL EXAMINATION: Vital signs: BP (!) 150/74   Pulse 77   Ht 5\' 3"  (1.6 m)   Wt 157 lb (71.2 kg)   SpO2 99%   BMI 27.81 kg/m   Constitutional: generally well-appearing, no acute distress Psychiatric: alert and oriented x3, cooperative Eyes: extraocular movements intact, anicteric, conjunctiva pink Mouth: oral pharynx moist, no lesions Neck: supple no lymphadenopathy Cardiovascular: heart regular rate and rhythm, no murmur Lungs: clear to auscultation bilaterally Abdomen: soft, mildly obese, nontender, nondistended, no obvious ascites, no peritoneal signs, normal bowel sounds, no organomegaly Rectal: No external abnormalities.  No internal mass or tenderness.  Stool is brown and Hemoccult negative.  There are inflamed  internal hemorrhoids. Extremities: no clubbing, cyanosis, or lower extremity edema bilaterally Skin: no lesions on visible extremities Neuro: No focal deficits. No asterixis.    ASSESSMENT:  1.  Change in bowel habits as described.  Nonspecific.  Caliber change may be secondary to sigmoid stenosis. 2.  Minor rectal bleeding secondary to internal hemorrhoids. 3.  Severe diverticulosis with sigmoid stenosis preventing safe colonoscopy. 4.  Remote history of colon polyps 5.  GERD with peptic stricture.  Asymptomatic on PPI   PLAN:  1.  Metamucil 2 tablespoons daily 2.  Anusol HC suppositories 3.  Reflux precautions 4.  Continue PPI 5.  CBC today 6.  Office follow-up as needed.  If any point patient might require colonic assessment, this would be via virtual colonoscopy.  We discussed this based on limitations from prior colonoscopy.  A total time of 45 minutes was spent preparing to see the patient, reviewing test and x-rays, obtaining comprehensive history, performing  comprehensive physical examination, counseling the patient regarding the above listed issues, ordering medications and blood work, and documenting clinical information in the health record

## 2021-02-01 DIAGNOSIS — I10 Essential (primary) hypertension: Secondary | ICD-10-CM | POA: Diagnosis not present

## 2021-02-01 DIAGNOSIS — M810 Age-related osteoporosis without current pathological fracture: Secondary | ICD-10-CM | POA: Diagnosis not present

## 2021-02-01 DIAGNOSIS — R82998 Other abnormal findings in urine: Secondary | ICD-10-CM | POA: Diagnosis not present

## 2021-02-08 DIAGNOSIS — Z1339 Encounter for screening examination for other mental health and behavioral disorders: Secondary | ICD-10-CM | POA: Diagnosis not present

## 2021-02-08 DIAGNOSIS — N1832 Chronic kidney disease, stage 3b: Secondary | ICD-10-CM | POA: Diagnosis not present

## 2021-02-08 DIAGNOSIS — Z1331 Encounter for screening for depression: Secondary | ICD-10-CM | POA: Diagnosis not present

## 2021-02-08 DIAGNOSIS — M419 Scoliosis, unspecified: Secondary | ICD-10-CM | POA: Diagnosis not present

## 2021-02-08 DIAGNOSIS — G4762 Sleep related leg cramps: Secondary | ICD-10-CM | POA: Diagnosis not present

## 2021-02-08 DIAGNOSIS — K219 Gastro-esophageal reflux disease without esophagitis: Secondary | ICD-10-CM | POA: Diagnosis not present

## 2021-02-08 DIAGNOSIS — E663 Overweight: Secondary | ICD-10-CM | POA: Diagnosis not present

## 2021-02-08 DIAGNOSIS — M1990 Unspecified osteoarthritis, unspecified site: Secondary | ICD-10-CM | POA: Diagnosis not present

## 2021-02-08 DIAGNOSIS — Z0000 Encounter for general adult medical examination without abnormal findings: Secondary | ICD-10-CM | POA: Diagnosis not present

## 2021-02-08 DIAGNOSIS — M810 Age-related osteoporosis without current pathological fracture: Secondary | ICD-10-CM | POA: Diagnosis not present

## 2021-02-08 DIAGNOSIS — I129 Hypertensive chronic kidney disease with stage 1 through stage 4 chronic kidney disease, or unspecified chronic kidney disease: Secondary | ICD-10-CM | POA: Diagnosis not present

## 2021-02-09 DIAGNOSIS — Z1212 Encounter for screening for malignant neoplasm of rectum: Secondary | ICD-10-CM | POA: Diagnosis not present

## 2021-02-12 DIAGNOSIS — G4762 Sleep related leg cramps: Secondary | ICD-10-CM | POA: Diagnosis not present

## 2021-02-12 DIAGNOSIS — Z0000 Encounter for general adult medical examination without abnormal findings: Secondary | ICD-10-CM | POA: Diagnosis not present

## 2021-02-17 DIAGNOSIS — L603 Nail dystrophy: Secondary | ICD-10-CM | POA: Diagnosis not present

## 2021-02-17 DIAGNOSIS — L601 Onycholysis: Secondary | ICD-10-CM | POA: Diagnosis not present

## 2021-02-17 DIAGNOSIS — Z85828 Personal history of other malignant neoplasm of skin: Secondary | ICD-10-CM | POA: Diagnosis not present

## 2021-02-18 ENCOUNTER — Telehealth: Payer: Self-pay | Admitting: Internal Medicine

## 2021-02-18 NOTE — Telephone Encounter (Signed)
Spoke with pt and let her know it was 2 TBSP, pt states she is not going to take that much. Discussed with her that it is her choice.

## 2021-03-01 DIAGNOSIS — L603 Nail dystrophy: Secondary | ICD-10-CM | POA: Diagnosis not present

## 2021-04-15 DIAGNOSIS — L821 Other seborrheic keratosis: Secondary | ICD-10-CM | POA: Diagnosis not present

## 2021-04-15 DIAGNOSIS — L570 Actinic keratosis: Secondary | ICD-10-CM | POA: Diagnosis not present

## 2021-04-15 DIAGNOSIS — Z85828 Personal history of other malignant neoplasm of skin: Secondary | ICD-10-CM | POA: Diagnosis not present

## 2021-04-15 DIAGNOSIS — L820 Inflamed seborrheic keratosis: Secondary | ICD-10-CM | POA: Diagnosis not present

## 2021-04-22 DIAGNOSIS — H04123 Dry eye syndrome of bilateral lacrimal glands: Secondary | ICD-10-CM | POA: Diagnosis not present

## 2021-04-22 DIAGNOSIS — Z961 Presence of intraocular lens: Secondary | ICD-10-CM | POA: Diagnosis not present

## 2021-04-22 DIAGNOSIS — H353121 Nonexudative age-related macular degeneration, left eye, early dry stage: Secondary | ICD-10-CM | POA: Diagnosis not present

## 2021-04-22 DIAGNOSIS — H5213 Myopia, bilateral: Secondary | ICD-10-CM | POA: Diagnosis not present

## 2021-04-22 DIAGNOSIS — H524 Presbyopia: Secondary | ICD-10-CM | POA: Diagnosis not present

## 2021-04-22 DIAGNOSIS — H35361 Drusen (degenerative) of macula, right eye: Secondary | ICD-10-CM | POA: Diagnosis not present

## 2021-04-22 DIAGNOSIS — H02423 Myogenic ptosis of bilateral eyelids: Secondary | ICD-10-CM | POA: Diagnosis not present

## 2021-04-22 DIAGNOSIS — H52223 Regular astigmatism, bilateral: Secondary | ICD-10-CM | POA: Diagnosis not present

## 2021-04-22 DIAGNOSIS — H35372 Puckering of macula, left eye: Secondary | ICD-10-CM | POA: Diagnosis not present

## 2021-05-13 ENCOUNTER — Telehealth: Payer: Self-pay | Admitting: Neurology

## 2021-05-13 NOTE — Telephone Encounter (Signed)
Pt called needing to discuss her cpap machine with the RN. Please advise.

## 2021-05-13 NOTE — Telephone Encounter (Signed)
Called the patient back she is concerned that on her machine it is indicating that there is a increase in apnea events.  Advised the patient that in reviewing her data, the numbers on the machine overall are looking great.  I asked the patient if she is feeling any different and she states that she does not.  She states that she still has energy in the morning and does not have any concerns.  She just wanted to confirm there was nothing going on given the events that were going up and down.  I advised the patient that since her machine is not going to be the maximum pressure and her apnea events are overall under 5 there would be no need to make any changes or adjustments.  Patient verbalized understanding and was appreciative for the call back.

## 2021-05-26 DIAGNOSIS — G894 Chronic pain syndrome: Secondary | ICD-10-CM | POA: Diagnosis not present

## 2021-05-26 DIAGNOSIS — M461 Sacroiliitis, not elsewhere classified: Secondary | ICD-10-CM | POA: Diagnosis not present

## 2021-05-26 DIAGNOSIS — M47816 Spondylosis without myelopathy or radiculopathy, lumbar region: Secondary | ICD-10-CM | POA: Diagnosis not present

## 2021-05-29 ENCOUNTER — Other Ambulatory Visit: Payer: Self-pay | Admitting: Orthopaedic Surgery

## 2021-05-29 DIAGNOSIS — G894 Chronic pain syndrome: Secondary | ICD-10-CM

## 2021-06-13 DIAGNOSIS — H5789 Other specified disorders of eye and adnexa: Secondary | ICD-10-CM | POA: Diagnosis not present

## 2021-06-18 DIAGNOSIS — H04123 Dry eye syndrome of bilateral lacrimal glands: Secondary | ICD-10-CM | POA: Diagnosis not present

## 2021-06-18 DIAGNOSIS — B309 Viral conjunctivitis, unspecified: Secondary | ICD-10-CM | POA: Diagnosis not present

## 2021-06-18 DIAGNOSIS — H02423 Myogenic ptosis of bilateral eyelids: Secondary | ICD-10-CM | POA: Diagnosis not present

## 2021-06-28 DIAGNOSIS — R509 Fever, unspecified: Secondary | ICD-10-CM | POA: Diagnosis not present

## 2021-06-28 DIAGNOSIS — J40 Bronchitis, not specified as acute or chronic: Secondary | ICD-10-CM | POA: Diagnosis not present

## 2021-06-28 DIAGNOSIS — Z20822 Contact with and (suspected) exposure to covid-19: Secondary | ICD-10-CM | POA: Diagnosis not present

## 2021-07-06 DIAGNOSIS — R051 Acute cough: Secondary | ICD-10-CM | POA: Diagnosis not present

## 2021-07-06 DIAGNOSIS — R5383 Other fatigue: Secondary | ICD-10-CM | POA: Diagnosis not present

## 2021-07-06 DIAGNOSIS — R0981 Nasal congestion: Secondary | ICD-10-CM | POA: Diagnosis not present

## 2021-07-06 DIAGNOSIS — Z1152 Encounter for screening for COVID-19: Secondary | ICD-10-CM | POA: Diagnosis not present

## 2021-07-06 DIAGNOSIS — R062 Wheezing: Secondary | ICD-10-CM | POA: Diagnosis not present

## 2021-07-06 DIAGNOSIS — G4733 Obstructive sleep apnea (adult) (pediatric): Secondary | ICD-10-CM | POA: Diagnosis not present

## 2021-07-13 ENCOUNTER — Other Ambulatory Visit: Payer: Medicare Other

## 2021-07-22 DIAGNOSIS — J040 Acute laryngitis: Secondary | ICD-10-CM | POA: Diagnosis not present

## 2021-07-22 DIAGNOSIS — R5383 Other fatigue: Secondary | ICD-10-CM | POA: Diagnosis not present

## 2021-07-22 DIAGNOSIS — R0981 Nasal congestion: Secondary | ICD-10-CM | POA: Diagnosis not present

## 2021-07-22 DIAGNOSIS — J101 Influenza due to other identified influenza virus with other respiratory manifestations: Secondary | ICD-10-CM | POA: Diagnosis not present

## 2021-07-22 DIAGNOSIS — R058 Other specified cough: Secondary | ICD-10-CM | POA: Diagnosis not present

## 2021-07-22 DIAGNOSIS — Z1152 Encounter for screening for COVID-19: Secondary | ICD-10-CM | POA: Diagnosis not present

## 2021-07-22 DIAGNOSIS — H1032 Unspecified acute conjunctivitis, left eye: Secondary | ICD-10-CM | POA: Diagnosis not present

## 2021-08-03 ENCOUNTER — Other Ambulatory Visit: Payer: Self-pay | Admitting: Internal Medicine

## 2021-08-03 DIAGNOSIS — K219 Gastro-esophageal reflux disease without esophagitis: Secondary | ICD-10-CM | POA: Diagnosis not present

## 2021-08-03 DIAGNOSIS — K449 Diaphragmatic hernia without obstruction or gangrene: Secondary | ICD-10-CM | POA: Diagnosis not present

## 2021-08-03 DIAGNOSIS — R058 Other specified cough: Secondary | ICD-10-CM | POA: Diagnosis not present

## 2021-08-04 ENCOUNTER — Ambulatory Visit
Admission: RE | Admit: 2021-08-04 | Discharge: 2021-08-04 | Disposition: A | Discharge status: Home / Self Care | Payer: Medicare Other | Source: Ambulatory Visit | Attending: Internal Medicine | Admitting: Internal Medicine

## 2021-08-04 DIAGNOSIS — K449 Diaphragmatic hernia without obstruction or gangrene: Secondary | ICD-10-CM | POA: Diagnosis not present

## 2021-08-04 DIAGNOSIS — K224 Dyskinesia of esophagus: Secondary | ICD-10-CM | POA: Diagnosis not present

## 2021-08-11 DIAGNOSIS — K449 Diaphragmatic hernia without obstruction or gangrene: Secondary | ICD-10-CM | POA: Diagnosis not present

## 2021-08-24 DIAGNOSIS — L821 Other seborrheic keratosis: Secondary | ICD-10-CM | POA: Diagnosis not present

## 2021-08-24 DIAGNOSIS — Z85828 Personal history of other malignant neoplasm of skin: Secondary | ICD-10-CM | POA: Diagnosis not present

## 2021-08-24 DIAGNOSIS — L570 Actinic keratosis: Secondary | ICD-10-CM | POA: Diagnosis not present

## 2021-08-26 DIAGNOSIS — H35372 Puckering of macula, left eye: Secondary | ICD-10-CM | POA: Diagnosis not present

## 2021-08-26 DIAGNOSIS — H04123 Dry eye syndrome of bilateral lacrimal glands: Secondary | ICD-10-CM | POA: Diagnosis not present

## 2021-08-26 DIAGNOSIS — H35361 Drusen (degenerative) of macula, right eye: Secondary | ICD-10-CM | POA: Diagnosis not present

## 2021-08-26 DIAGNOSIS — H353121 Nonexudative age-related macular degeneration, left eye, early dry stage: Secondary | ICD-10-CM | POA: Diagnosis not present

## 2021-08-26 DIAGNOSIS — H02423 Myogenic ptosis of bilateral eyelids: Secondary | ICD-10-CM | POA: Diagnosis not present

## 2021-08-30 DIAGNOSIS — Z23 Encounter for immunization: Secondary | ICD-10-CM | POA: Diagnosis not present

## 2021-08-31 DIAGNOSIS — H02423 Myogenic ptosis of bilateral eyelids: Secondary | ICD-10-CM | POA: Diagnosis not present

## 2021-09-13 NOTE — Progress Notes (Addendum)
COVID swab appointment: 10/01/21  COVID Vaccine Completed: yes x5 Date COVID Vaccine completed: Has received booster: COVID vaccine manufacturer: Moderna     Date of COVID positive in last 90 days: no  PCP - Burnard Bunting, MD Cardiologist - n/a  Chest x-ray - yes per pt EKG - 09/14/21 epic/chart Stress Test - n/a ECHO - n/a Cardiac Cath - n/a Pacemaker/ICD device last checked: n/a Spinal Cord Stimulator: yes, for level 8 fusion. Will bring remote DOS  Sleep Study - yes positive CPAP - every night, brining CPAP mask and tubing DOS  Fasting Blood Sugar - n/a Checks Blood Sugar _____ times a day  Blood Thinner Instructions: Aspirin Instructions: ASA 81, no instructions given. Last Dose:  Activity level: Can go up a flight of stairs and perform activities of daily living without stopping and without symptoms of chest pain or shortness of breath.    Anesthesia review: OSA, HTN, DVT, esophageal stricture, LBBB, CKD, spinal cord stimulator  Patient denies shortness of breath, fever, cough and chest pain at PAT appointment   Patient verbalized understanding of instructions that were given to them at the PAT appointment. Patient was also instructed that they will need to review over the PAT instructions again at home before surgery.

## 2021-09-13 NOTE — Patient Instructions (Addendum)
DUE TO COVID-19 ONLY ONE VISITOR IS ALLOWED TO COME WITH YOU AND STAY IN THE WAITING ROOM ONLY DURING PRE OP AND PROCEDURE.   **NO VISITORS ARE ALLOWED IN THE SHORT STAY AREA OR RECOVERY ROOM!!**  IF YOU WILL BE ADMITTED INTO THE HOSPITAL YOU ARE ALLOWED ONLY TWO SUPPORT PEOPLE DURING VISITATION HOURS ONLY (10AM -8PM)   The support person(s) may change daily. The support person(s) must pass our screening, gel in and out, and wear a mask at all times, including in the patient's room. Patients must also wear a mask when staff or their support person are in the room.  No visitors under the age of 38. Any visitor under the age of 44 must be accompanied by an adult.    COVID SWAB TESTING MUST BE COMPLETED ON:  10/05/21 at the hospital.       Your procedure is scheduled on: 10/05/21   Report to [facility] Main Entrance    Report to admitting at 6:45 AM   Call this number if you have problems the morning of surgery [phone]   Do not eat food :After Midnight.   May have liquids until 6:45 AM day of surgery  CLEAR LIQUID DIET  Foods Allowed                                                                     Foods Excluded  Water, Black Coffee and tea (no milk or creamer)            liquids that you cannot  Plain Jell-O in any flavor  (No red)                                    see through such as: Fruit ices (not with fruit pulp)                                            milk, soups, orange juice              Iced Popsicles (No red)                                                All solid food                                   Apple juices Sports drinks like Gatorade (No red) Lightly seasoned clear broth or consume(fat free) Sugar   Oral Hygiene is also important to reduce your risk of infection.                                    Remember - BRUSH YOUR TEETH THE MORNING OF SURGERY WITH YOUR REGULAR TOOTHPASTE   Take these medicines the morning of surgery with A SIP OF  WATER: Celexa, Pantoprazole                              You may not have any metal on your body including hair pins, jewelry, and body piercing             Do not wear make-up, lotions, powders, perfume, or deodorant  Do not wear nail polish including gel and S&S, artificial/acrylic nails, or any other type of covering on natural nails including finger and toenails. If you have artificial nails, gel coating, etc. that needs to be removed by a nail salon please have this removed prior to surgery or surgery may need to be canceled/ delayed if the surgeon/ anesthesia feels like they are unable to be safely monitored.   Do not shave  48 hours prior to surgery.    Do not bring valuables to the hospital. [place].   Bring small overnight bag day of surgery.   Special Instructions: Bring a copy of your healthcare power of attorney and living will documents         the day of surgery if you haven't scanned them before.   Please read over the following fact sheets you were given: IF YOU HAVE QUESTIONS ABOUT YOUR PRE-OP INSTRUCTIONS PLEASE CALL [facility] - Preparing for Surgery Before surgery, you can play an important role.  Because skin is not sterile, your skin needs to be as free of germs as possible.  You can reduce the number of germs on your skin by washing with CHG (chlorahexidine gluconate) soap before surgery.  CHG is an antiseptic cleaner which kills germs and bonds with the skin to continue killing germs even after washing. Please DO NOT use if you have an allergy to CHG or antibacterial soaps.  If your skin becomes reddened/irritated stop using the CHG and inform your nurse when you arrive at Short Stay. Do not shave (including legs and underarms) for at least 48 hours prior to the first CHG shower.  You may shave your face/neck.  Please follow these instructions carefully:  1.  Shower with CHG Soap the night  before surgery and the  morning of surgery.  2.  If you choose to wash your hair, wash your hair first as usual with your normal  shampoo.  3.  After you shampoo, rinse your hair and body thoroughly to remove the shampoo.                             4.  Use CHG as you would any other liquid soap.  You can apply chg directly to the skin and wash.  Gently with a scrungie or clean washcloth.  5.  Apply the CHG Soap to your body ONLY FROM THE NECK DOWN.   Do   not use on face/ open                           Wound or open sores. Avoid contact with eyes, ears mouth and   genitals (private parts).                       Wash face,  Genitals (private parts) with your normal soap.  6.  Wash thoroughly, paying special attention to the area where your    surgery  will be performed.  7.  Thoroughly rinse your body with warm water from the neck down.  8.  DO NOT shower/wash with your normal soap after using and rinsing off the CHG Soap.                9.  Pat yourself dry with a clean towel.            10.  Wear clean pajamas.            11.  Place clean sheets on your bed the night of your first shower and do not  sleep with pets. Day of Surgery : Do not apply any lotions/deodorants the morning of surgery.  Please wear clean clothes to the hospital/surgery center.  FAILURE TO FOLLOW THESE INSTRUCTIONS MAY RESULT IN THE CANCELLATION OF YOUR SURGERY  PATIENT SIGNATURE_________________________________  NURSE SIGNATURE__________________________________  ________________________________________________________________________

## 2021-09-13 NOTE — Progress Notes (Signed)
Please place orders for PAT appointment scheduled 09/14/21.

## 2021-09-14 ENCOUNTER — Encounter (HOSPITAL_COMMUNITY): Payer: Self-pay

## 2021-09-14 ENCOUNTER — Encounter (HOSPITAL_COMMUNITY)
Admission: RE | Admit: 2021-09-14 | Discharge: 2021-09-14 | Disposition: A | Discharge status: Home / Self Care | Payer: Medicare Other | Source: Ambulatory Visit | Attending: Surgery | Admitting: Surgery

## 2021-09-14 ENCOUNTER — Other Ambulatory Visit: Payer: Self-pay

## 2021-09-14 VITALS — BP 128/73 | HR 74 | Temp 97.7°F | Resp 18 | Ht 61.0 in | Wt 156.5 lb

## 2021-09-14 DIAGNOSIS — Z01818 Encounter for other preprocedural examination: Secondary | ICD-10-CM | POA: Insufficient documentation

## 2021-09-14 DIAGNOSIS — I2510 Atherosclerotic heart disease of native coronary artery without angina pectoris: Secondary | ICD-10-CM

## 2021-09-14 DIAGNOSIS — I739 Peripheral vascular disease, unspecified: Secondary | ICD-10-CM

## 2021-09-14 LAB — BASIC METABOLIC PANEL WITH GFR
Anion gap: 7 (ref 5–15)
BUN: 17 mg/dL (ref 8–23)
CO2: 26 mmol/L (ref 22–32)
Calcium: 9.6 mg/dL (ref 8.9–10.3)
Chloride: 105 mmol/L (ref 98–111)
Creatinine, Ser: 0.97 mg/dL (ref 0.44–1.00)
GFR, Estimated: 57 mL/min — ABNORMAL LOW
Glucose, Bld: 99 mg/dL (ref 70–99)
Potassium: 4 mmol/L (ref 3.5–5.1)
Sodium: 138 mmol/L (ref 135–145)

## 2021-09-14 LAB — CBC
HCT: 37.4 % (ref 36.0–46.0)
Hemoglobin: 12.1 g/dL (ref 12.0–15.0)
MCH: 31.3 pg (ref 26.0–34.0)
MCHC: 32.4 g/dL (ref 30.0–36.0)
MCV: 96.9 fL (ref 80.0–100.0)
Platelets: 259 10*3/uL (ref 150–400)
RBC: 3.86 MIL/uL — ABNORMAL LOW (ref 3.87–5.11)
RDW: 13.9 % (ref 11.5–15.5)
WBC: 5.5 10*3/uL (ref 4.0–10.5)
nRBC: 0 % (ref 0.0–0.2)

## 2021-09-22 ENCOUNTER — Encounter (HOSPITAL_COMMUNITY): Payer: Self-pay | Admitting: Certified Registered"

## 2021-09-22 ENCOUNTER — Encounter (HOSPITAL_COMMUNITY): Payer: Self-pay | Admitting: Physician Assistant

## 2021-10-02 NOTE — H&P (Addendum)
Chief Complaint: Follow-up (Hiatal hernia)   History of Present Illness: Stacy Parker is a 87 y.o. female who is seen today as an office consultation at the request of Dr. Pasty Arch for evaluation of Follow-up (Hiatal hernia) .   Repaired and pretty much total stomach and chest about 10 years ago. She got along well and had had some coughing and we repeated an upper GI series in 2021 which was still a small recurrent hiatal hernia. However she is about 6 weeks ago developed really significant coughing and suspected a recurrent hiatal hernia. An upper GI was obtained by Dr. Harley Hallmark office and this showed that she had a distal stricture with a bigger hiatal hernia recurrence. Coughing is really gotten to be a problem and the only thing that helped her is Hydromet. I will refill that.  We will discussed repair of this with the robot. I think she wants to go ahead and proceed with repair. She has done her homework and is well versed in her health. [name] had reviously scoped her. She declined recurrent endoscopy. We will go ahead and set up for robotic hiatal hernia repair and probable Nissen fundoplication  Review of Systems: See HPI as well for other ROS.  ROS   Medical History: Past Medical History:  Diagnosis Date   Acid reflux   Hiatal hernia   Sleep apnea   Patient Active Problem List  Diagnosis   Chronic low back pain   Lumbar post-laminectomy syndrome   Right buttock pain   History of total replacement of both hip joints   Hiatal hernia   Esophageal stricture   Past Surgical History:  Procedure Laterality Date   BACK SURGERY   neurostimulator implant surgery   nissen fundoplication N/A 4268    Allergies  Allergen Reactions   Flagyl [Metronidazole Hcl] Nausea   Phenergan [Promethazine] Other (See Comments)  Other Reaction: CNS Disorder   Ambien [Zolpidem] Diarrhea   Ace Inhibitors Other (See Comments)  unknown   Ceftin [Cefuroxime Axetil] Diarrhea and  Swelling   Cymbalta [Duloxetine] Nausea   Dicloxacillin Diarrhea   Doxycycline Nausea   Ibandronate Other (See Comments)  Flu like symptoms STATES ALL THE OSTEOPOROSIS DRUGS HAVE CAUSED FLU LIKE SYMPTOMS   Succinylcholine Muscle Pain   Current Outpatient Medications on File Prior to Visit  Medication Sig Dispense Refill   citalopram (CELEXA) 20 MG tablet 1 tab by mouth 2 times a day   gabapentin (NEURONTIN) 300 MG capsule Take by mouth   pantoprazole (PROTONIX) 40 MG DR tablet Take 40 mg by mouth once daily.   amLODIPine (NORVASC) 5 MG tablet Take 5 mg by mouth once daily. (Patient not taking: Reported on 08/11/2021)   ascorbic acid, vitamin C, (VITAMIN C) 1000 MG tablet Take 1,000 mg by mouth once daily. (Patient not taking: Reported on 08/11/2021)   aspirin 81 mg tablet 1 tab by mouth daily,Mon.Wed.Fri.Sat (Patient not taking: Reported on 08/11/2021)   bifidobacterium infantis (ALIGN) 4 mg capsule 1 tab by mouth daily (Patient not taking: Reported on 08/11/2021)   biotin 5 mg capsule Take 5 mg by mouth once daily. (Patient not taking: Reported on 08/11/2021)   calcium carb-D3-mag cmb11-zinc 333-200-133-5 mg-unit-mg-mg Tab Take by mouth. (Patient not taking: Reported on 08/11/2021)   calcium carbonate-vit D3-min 600 mg calcium- 400 unit Tab 1 tab by mouth daily (Patient not taking: Reported on 08/11/2021)   cholecalciferol (VITAMIN D3) 1000 unit tablet Take by mouth. (Patient not taking: Reported on 08/11/2021)  docusate sodium (COLACE) 100 MG capsule 1 cap by mouth 2 times a day (Patient not taking: Reported on 08/11/2021)   finasteride (PROSCAR) 5 mg tablet Take 2.5 mg by mouth once daily. (Patient not taking: Reported on 08/11/2021)   losartan-hydrochlorothiazide (HYZAAR) 100-25 mg tablet losartan 100 mg-hydrochlorothiazide 25 mg tablet TAKE 1 TABLET BY MOUTH EVERY DAY   methocarbamol (ROBAXIN) 500 MG tablet 1 tab by mouth daily as needed (Patient not taking: Reported on 08/11/2021)    multivitamin-lutein (CENTRUM SILVER) tablet Take 1 tablet by mouth. (Patient not taking: Reported on 08/11/2021)   omega-3 fatty acids-fish oil 360-1,200 mg Cap Take by mouth. (Patient not taking: Reported on 08/11/2021)   quinine sulfate 324 mg Cap 1 cap by mouth at bedtime (Patient not taking: Reported on 08/11/2021)   No current facility-administered medications on file prior to visit.   Family History  Problem Relation Age of Onset   COPD Mother   Heart disease Father   High blood pressure (Hypertension) Father    Social History   Tobacco Use  Smoking Status Former Smoker  Smokeless Tobacco Never Used    Social History   Socioeconomic History   Marital status: Married  Tobacco Use   Smoking status: Former Smoker   Smokeless tobacco: Never Used  Substance and Sexual Activity   Alcohol use: Yes  Comment: occassional   Drug use: Never   Objective:   Vitals:  08/11/21 1059  BP: 118/70  Pulse: 61  SpO2: 95%  Weight: 71.8 kg (158 lb 3.2 oz)  Height: 154.9 cm (5\' 1" )   Body mass index is 29.89 kg/m.  Physical Exam General: Well maintained white female no acute distress HEENT : Unremarkable. Chest: Breathing is without wheezing this morning. She manages her cough with Hydromet. Heart: Sinus rhythm Breast: Not examined Abdomen: Nontender GU not examined Rectal not performed Extremities full range of motion Neuro alert and oriented x3. Motor and sensory function grossly intact  Labs, Imaging and Diagnostic Testing: Upper GI series at [facility] was reviewed  Assessment and Plan:   Recurrent hiatal hernia with obstruction of tablet. Before (2013) she had all of her stomach in the chest and she had foreshortening of the esophagus. Plan to reduce the stomach and close the hiatus.  Before we used biologic mesh and an relaxing incision.  Also a gastropexy was performed with prolene (x2).    I have explained the limitations of the procedure.  Her  coughing episodes has stopped.    We have talked and she is back to baseline-coughing gone. Her reflux is baseline.  She says that she is normal for her.  Based on her state of health, I will cancel her surgery for today.  She is good with that.   Khyron Garno Donia Pounds, MD

## 2021-10-05 ENCOUNTER — Ambulatory Visit (HOSPITAL_COMMUNITY)
Admission: RE | Admit: 2021-10-05 | Discharge: 2021-10-05 | Disposition: A | Discharge status: Home / Self Care | Payer: Medicare Other | Source: Ambulatory Visit | Attending: Surgery | Admitting: Surgery

## 2021-10-05 ENCOUNTER — Encounter (HOSPITAL_COMMUNITY): Payer: Self-pay | Admitting: Surgery

## 2021-10-05 ENCOUNTER — Encounter (HOSPITAL_COMMUNITY)
Admission: RE | Disposition: A | Discharge status: Home / Self Care | Payer: Self-pay | Source: Ambulatory Visit | Attending: Surgery

## 2021-10-05 DIAGNOSIS — Z538 Procedure and treatment not carried out for other reasons: Secondary | ICD-10-CM | POA: Insufficient documentation

## 2021-10-05 DIAGNOSIS — Z87891 Personal history of nicotine dependence: Secondary | ICD-10-CM | POA: Insufficient documentation

## 2021-10-05 DIAGNOSIS — R059 Cough, unspecified: Secondary | ICD-10-CM | POA: Diagnosis not present

## 2021-10-05 DIAGNOSIS — Z20822 Contact with and (suspected) exposure to covid-19: Secondary | ICD-10-CM | POA: Insufficient documentation

## 2021-10-05 DIAGNOSIS — K440 Diaphragmatic hernia with obstruction, without gangrene: Secondary | ICD-10-CM | POA: Insufficient documentation

## 2021-10-05 DIAGNOSIS — Z01818 Encounter for other preprocedural examination: Secondary | ICD-10-CM

## 2021-10-05 LAB — SARS CORONAVIRUS 2 BY RT PCR (HOSPITAL ORDER, PERFORMED IN ~~LOC~~ HOSPITAL LAB): SARS Coronavirus 2: NEGATIVE

## 2021-10-05 SURGERY — REPAIR, HERNIA, HIATAL, ROBOT-ASSISTED
Anesthesia: General

## 2021-10-05 MED ORDER — BUPIVACAINE LIPOSOME 1.3 % IJ SUSP: 20.0000 mL | Freq: Once | INTRAMUSCULAR | Status: DC

## 2021-10-05 MED ORDER — CEFAZOLIN SODIUM-DEXTROSE 2-4 GM/100ML-% IV SOLN
INTRAVENOUS | Status: AC
  Filled 2021-10-05: qty 100

## 2021-10-05 MED ORDER — CHLORHEXIDINE GLUCONATE 0.12 % MT SOLN
15.0000 mL | Freq: Once | OROMUCOSAL | Status: AC
  Administered 2021-10-05: 15 mL via OROMUCOSAL

## 2021-10-05 MED ORDER — SCOPOLAMINE 1 MG/3DAYS TD PT72
1.0000 | MEDICATED_PATCH | TRANSDERMAL | Status: DC
  Administered 2021-10-05: 1.5 mg via TRANSDERMAL

## 2021-10-05 MED ORDER — CHLORHEXIDINE GLUCONATE CLOTH 2 % EX PADS: 6.0000 | MEDICATED_PAD | Freq: Once | CUTANEOUS | Status: DC

## 2021-10-05 MED ORDER — SCOPOLAMINE 1 MG/3DAYS TD PT72
MEDICATED_PATCH | TRANSDERMAL | Status: AC
  Filled 2021-10-05: qty 1

## 2021-10-05 MED ORDER — HEPARIN SODIUM (PORCINE) 5000 UNIT/ML IJ SOLN
INTRAMUSCULAR | Status: AC
  Filled 2021-10-05: qty 1

## 2021-10-05 MED ORDER — PROPOFOL 10 MG/ML IV BOLUS
INTRAVENOUS | Status: AC
  Filled 2021-10-05: qty 20

## 2021-10-05 MED ORDER — ACETAMINOPHEN 500 MG PO TABS
1000.0000 mg | ORAL_TABLET | ORAL | Status: AC
  Administered 2021-10-05: 1000 mg via ORAL

## 2021-10-05 MED ORDER — ACETAMINOPHEN 500 MG PO TABS
ORAL_TABLET | ORAL | Status: AC
  Filled 2021-10-05: qty 2

## 2021-10-05 MED ORDER — HEPARIN SODIUM (PORCINE) 5000 UNIT/ML IJ SOLN
5000.0000 [IU] | Freq: Once | INTRAMUSCULAR | Status: AC
  Administered 2021-10-05: 5000 [IU] via SUBCUTANEOUS

## 2021-10-05 MED ORDER — LACTATED RINGERS IV SOLN: INTRAVENOUS | Status: DC

## 2021-10-05 MED ORDER — FENTANYL CITRATE (PF) 100 MCG/2ML IJ SOLN
INTRAMUSCULAR | Status: AC
  Filled 2021-10-05: qty 2

## 2021-10-05 MED ORDER — ORAL CARE MOUTH RINSE: 15.0000 mL | Freq: Once | OROMUCOSAL | Status: AC

## 2021-10-05 MED ORDER — CEFAZOLIN SODIUM-DEXTROSE 2-4 GM/100ML-% IV SOLN: 2.0000 g | INTRAVENOUS | Status: DC

## 2021-10-05 SURGICAL SUPPLY — 41 items
ADH SKN CLS APL DERMABOND .7 (GAUZE/BANDAGES/DRESSINGS) ×1
APPLIER CLIP 5 13 M/L LIGAMAX5 (MISCELLANEOUS)
APPLIER CLIP ROT 13.4 12 LRG (CLIP)
APR CLP LRG 13.4X12 ROT 20 MLT (CLIP)
APR CLP MED LRG 5 ANG JAW (MISCELLANEOUS)
BLADE SURG 15 STRL LF DISP TIS (BLADE) ×1 IMPLANT
BLADE SURG 15 STRL SS (BLADE) ×2
CLIP APPLIE 5 13 M/L LIGAMAX5 (MISCELLANEOUS) IMPLANT
CLIP APPLIE ROT 13.4 12 LRG (CLIP) IMPLANT
CLIP VESOLOCK LG 6/CT PURPLE (CLIP) IMPLANT
COVER SURGICAL LIGHT HANDLE (MISCELLANEOUS) ×2 IMPLANT
COVER TIP SHEARS 8 DVNC (MISCELLANEOUS) ×1 IMPLANT
COVER TIP SHEARS 8MM DA VINCI (MISCELLANEOUS) ×2
DECANTER SPIKE VIAL GLASS SM (MISCELLANEOUS) ×2 IMPLANT
DERMABOND ADVANCED (GAUZE/BANDAGES/DRESSINGS) ×1
DERMABOND ADVANCED .7 DNX12 (GAUZE/BANDAGES/DRESSINGS) ×1 IMPLANT
DRAIN PENROSE 0.5X18 (DRAIN) ×2 IMPLANT
DRAPE COLUMN DVNC XI (DISPOSABLE) ×1 IMPLANT
DRAPE DA VINCI XI COLUMN (DISPOSABLE) ×2
ELECT REM PT RETURN 15FT ADLT (MISCELLANEOUS) ×2 IMPLANT
GAUZE 4X4 16PLY ~~LOC~~+RFID DBL (SPONGE) ×2 IMPLANT
GLOVE SURG ENC TEXT LTX SZ8 (GLOVE) ×4 IMPLANT
GOWN STRL REUS W/TWL XL LVL3 (GOWN DISPOSABLE) ×8 IMPLANT
GRASPER SUT TROCAR 14GX15 (MISCELLANEOUS) IMPLANT
KIT BASIN OR (CUSTOM PROCEDURE TRAY) ×2 IMPLANT
KIT TURNOVER KIT A (KITS) IMPLANT
MARKER SKIN DUAL TIP RULER LAB (MISCELLANEOUS) ×2 IMPLANT
NEEDLE HYPO 22GX1.5 SAFETY (NEEDLE) ×2 IMPLANT
SCISSORS LAP 5X45 EPIX DISP (ENDOMECHANICALS) IMPLANT
SEAL CANN UNIV 5-8 DVNC XI (MISCELLANEOUS) ×4 IMPLANT
SEAL XI 5MM-8MM UNIVERSAL (MISCELLANEOUS) ×8
SOL ANTI FOG 6CC (MISCELLANEOUS) ×1 IMPLANT
SOLUTION ANTI FOG 6CC (MISCELLANEOUS) ×1
SUT VICRYL 0 UR6 27IN ABS (SUTURE) IMPLANT
SYR 10ML ECCENTRIC (SYRINGE) ×2 IMPLANT
SYR 20ML LL LF (SYRINGE) ×2 IMPLANT
TOWEL OR 17X26 10 PK STRL BLUE (TOWEL DISPOSABLE) ×2 IMPLANT
TRAY FOLEY MTR SLVR 16FR STAT (SET/KITS/TRAYS/PACK) IMPLANT
TROCAR ADV FIXATION 5X100MM (TROCAR) IMPLANT
TUBING CONNECTING 10 (TUBING) IMPLANT
TUBING INSUFFLATION 10FT LAP (TUBING) ×2 IMPLANT

## 2021-10-05 NOTE — Progress Notes (Signed)
Per physician, problem resolved and surgery cancelled.

## 2021-10-14 ENCOUNTER — Ambulatory Visit: Payer: Medicare Other | Admitting: Adult Health

## 2021-10-14 ENCOUNTER — Telehealth: Payer: Self-pay | Admitting: Neurology

## 2021-10-14 NOTE — Telephone Encounter (Signed)
Pt cancelled appt due to scheduling conflict. Insisted on rescheduling with Dr. William Dalton.

## 2021-10-15 DIAGNOSIS — M25552 Pain in left hip: Secondary | ICD-10-CM | POA: Diagnosis not present

## 2021-10-15 DIAGNOSIS — Z96643 Presence of artificial hip joint, bilateral: Secondary | ICD-10-CM | POA: Diagnosis not present

## 2021-10-19 DIAGNOSIS — N390 Urinary tract infection, site not specified: Secondary | ICD-10-CM | POA: Diagnosis not present

## 2021-10-22 ENCOUNTER — Encounter (HOSPITAL_COMMUNITY): Payer: Self-pay | Admitting: Surgery

## 2021-10-25 ENCOUNTER — Other Ambulatory Visit: Payer: Self-pay | Admitting: Orthopedic Surgery

## 2021-10-25 DIAGNOSIS — M25552 Pain in left hip: Secondary | ICD-10-CM

## 2021-11-03 ENCOUNTER — Other Ambulatory Visit: Payer: Self-pay

## 2021-11-03 ENCOUNTER — Ambulatory Visit
Admission: RE | Admit: 2021-11-03 | Discharge: 2021-11-03 | Disposition: A | Discharge status: Home / Self Care | Payer: Medicare Other | Source: Ambulatory Visit | Attending: Orthopedic Surgery | Admitting: Orthopedic Surgery

## 2021-11-03 DIAGNOSIS — M25552 Pain in left hip: Secondary | ICD-10-CM

## 2021-11-05 ENCOUNTER — Ambulatory Visit
Admission: RE | Admit: 2021-11-05 | Discharge: 2021-11-05 | Disposition: A | Discharge status: Home / Self Care | Payer: Medicare Other | Source: Ambulatory Visit | Attending: Orthopedic Surgery | Admitting: Orthopedic Surgery

## 2021-11-05 DIAGNOSIS — M25552 Pain in left hip: Secondary | ICD-10-CM | POA: Diagnosis not present

## 2021-11-12 DIAGNOSIS — M25552 Pain in left hip: Secondary | ICD-10-CM | POA: Diagnosis not present

## 2021-11-15 DIAGNOSIS — Z20822 Contact with and (suspected) exposure to covid-19: Secondary | ICD-10-CM | POA: Diagnosis not present

## 2021-12-03 DIAGNOSIS — Z1231 Encounter for screening mammogram for malignant neoplasm of breast: Secondary | ICD-10-CM | POA: Diagnosis not present

## 2021-12-22 DIAGNOSIS — H02423 Myogenic ptosis of bilateral eyelids: Secondary | ICD-10-CM | POA: Diagnosis not present

## 2021-12-22 DIAGNOSIS — G4733 Obstructive sleep apnea (adult) (pediatric): Secondary | ICD-10-CM | POA: Diagnosis not present

## 2021-12-22 DIAGNOSIS — I129 Hypertensive chronic kidney disease with stage 1 through stage 4 chronic kidney disease, or unspecified chronic kidney disease: Secondary | ICD-10-CM | POA: Diagnosis not present

## 2021-12-22 DIAGNOSIS — H02403 Unspecified ptosis of bilateral eyelids: Secondary | ICD-10-CM | POA: Diagnosis not present

## 2021-12-22 DIAGNOSIS — K219 Gastro-esophageal reflux disease without esophagitis: Secondary | ICD-10-CM | POA: Diagnosis not present

## 2021-12-22 DIAGNOSIS — I10 Essential (primary) hypertension: Secondary | ICD-10-CM | POA: Diagnosis not present

## 2021-12-22 DIAGNOSIS — N1832 Chronic kidney disease, stage 3b: Secondary | ICD-10-CM | POA: Diagnosis not present

## 2021-12-27 DIAGNOSIS — Z9689 Presence of other specified functional implants: Secondary | ICD-10-CM | POA: Diagnosis not present

## 2021-12-27 DIAGNOSIS — I499 Cardiac arrhythmia, unspecified: Secondary | ICD-10-CM | POA: Diagnosis not present

## 2021-12-27 DIAGNOSIS — I129 Hypertensive chronic kidney disease with stage 1 through stage 4 chronic kidney disease, or unspecified chronic kidney disease: Secondary | ICD-10-CM | POA: Diagnosis not present

## 2021-12-27 DIAGNOSIS — G4733 Obstructive sleep apnea (adult) (pediatric): Secondary | ICD-10-CM | POA: Diagnosis not present

## 2021-12-27 DIAGNOSIS — N1832 Chronic kidney disease, stage 3b: Secondary | ICD-10-CM | POA: Diagnosis not present

## 2021-12-30 ENCOUNTER — Ambulatory Visit: Payer: Medicare Other | Admitting: Neurology

## 2021-12-30 ENCOUNTER — Ambulatory Visit (INDEPENDENT_AMBULATORY_CARE_PROVIDER_SITE_OTHER): Payer: Medicare Other | Admitting: Adult Health

## 2021-12-30 ENCOUNTER — Encounter: Payer: Self-pay | Admitting: Adult Health

## 2021-12-30 VITALS — BP 143/85 | HR 81 | Ht 63.0 in | Wt 157.0 lb

## 2021-12-30 DIAGNOSIS — Z9989 Dependence on other enabling machines and devices: Secondary | ICD-10-CM

## 2021-12-30 DIAGNOSIS — G4733 Obstructive sleep apnea (adult) (pediatric): Secondary | ICD-10-CM

## 2021-12-30 NOTE — Patient Instructions (Signed)
Continue using CPAP nightly and greater than 4 hours each night °If your symptoms worsen or you develop new symptoms please let us know.  ° °

## 2021-12-30 NOTE — Progress Notes (Signed)
PATIENT: Stacy Parker DOB: 08-Jun-1934  REASON FOR VISIT: follow up HISTORY FROM: patient PRIMARY NEUROLOGIST: Dr. Brett Fairy  HISTORY OF PRESENT ILLNESS:  Today 12/30/21:  [name] is an 88 year old female with a history of obstructive sleep apnea on CPAP.  She reports that the CPAP is working well for her.  Her download is below      REVIEW OF SYSTEMS: Out of a complete 14 system review of symptoms, the patient complains only of the following symptoms, and all other reviewed systems are negative.   ESS 1  ALLERGIES: Allergies  Allergen Reactions   Ace Inhibitors Other (See Comments)    Unsure of what reaction was   Alendronate Sodium    Ambien  [Zolpidem Tartrate] Diarrhea   Boniva [Ibandronic Acid] Other (See Comments)    Flu like symptoms STATES ALL THE OSTEOPOROSIS DRUGS HAVE CAUSED FLU LIKE SYMPTOMS    Ceftin  [Cefuroxime Axetil] Diarrhea and Swelling   Cymbalta [Duloxetine Hcl] Nausea Only   Dicloxacillin Diarrhea    Has patient had a PCN reaction causing immediate rash, facial/tongue/throat swelling, SOB or lightheadedness with hypotension:unsure Has patient had a PCN reaction causing severe rash involving mucus membranes or skin necrosis:unsure Has patient had a PCN reaction that required hospitalization:NO Has patient had a PCN reaction occurring within the last 10 years:unsure If all of the above answers are "NO", then may proceed with Cephalosporin use.    Doxycycline Other (See Comments) and Nausea Only    unknown   Fosamax [Alendronate]    Metronidazole Nausea And Vomiting   Phenergan [Promethazine Hcl] Other (See Comments)    Other Reaction: CNS Disorder Cough/nausea/sweating    Succinylcholine Other (See Comments)    Muscle soreness all over body post operatively ("felt like I was hit by [facility] truck")    HOME MEDICATIONS: Outpatient Medications Prior to Visit  Medication Sig Dispense Refill   Ascorbic Acid (VITAMIN C PO) Take 1  tablet by mouth daily.     aspirin EC 81 MG tablet Take 81 mg by mouth 4 (four) times a week. Sundays, Tuesdays, Thursdays, Saturdays.     BIOTIN PO Take 6,000 mcg by mouth daily.     Black Pepper-Turmeric (TURMERIC COMPLEX/BLACK PEPPER PO) Take 1 capsule by mouth daily.     cephALEXin (KEFLEX) 500 MG capsule Take 1 capsule (500 mg total) by mouth 2 (two) times daily. (Patient not taking: Reported on 09/09/2021) 16 capsule 0   citalopram (CELEXA) 40 MG tablet Take 40 mg by mouth daily.     docusate sodium (COLACE) 100 MG capsule Take 100 mg by mouth daily.     gabapentin (NEURONTIN) 300 MG capsule Take 300 mg by mouth 2 (two) times daily.     hydrocortisone (ANUSOL-HC) 2.5 % rectal cream Place 1 application rectally at bedtime. (Patient not taking: No sig reported) 30 g 1   losartan-hydrochlorothiazide (HYZAAR) 100-25 MG tablet Take 1 tablet by mouth every evening.      Magnesium 250 MG TABS Take 250 mg by mouth daily.     Melatonin 5 MG TABS Take 5 mg by mouth at bedtime.      methocarbamol (ROBAXIN) 500 MG tablet Take 500 mg by mouth daily as needed for muscle spasms.     Multiple Minerals-Vitamins (CALCIUM-MAGNESIUM-ZINC-D3 PO) Take 1 tablet by mouth daily.     Multiple Vitamin (MULTIVITAMIN WITH MINERALS) TABS tablet Take 1 tablet by mouth daily. Senior Multivitamin     Multiple Vitamins-Minerals (PRESERVISION AREDS  2 PO) Take 1 tablet by mouth 2 (two) times daily.     Omega-3 Fatty Acids (FISH OIL) 1200 MG CAPS Take 1,200 mg by mouth 2 (two) times daily.     OVER THE COUNTER MEDICATION Take 1 capsule by mouth daily. [place]affron     pantoprazole (PROTONIX) 40 MG tablet Take 40 mg by mouth daily.     Polyethyl Glycol-Propyl Glycol 0.4-0.3 % SOLN Place 1 drop into both eyes 2 (two) times daily as needed (dry eyes).     Sodium Chloride-Xylitol (XLEAR SINUS CARE SPRAY NA) Place 2 sprays into the nose at bedtime.     No facility-administered medications prior to visit.    PAST MEDICAL  HISTORY: Past Medical History:  Diagnosis Date   Anxiety    Arthritis    Basal cell carcinoma    Cervical arthritis    PT STATES CERVICAL SPINE BADLY DEGENERATED--SHE DOES NOT HAVE NECK PAIN -BUT TOLD HER NECK IS FRAGILE   Complication of anesthesia    myalgia    Diverticulosis    DVT (deep venous thrombosis) (HCC)    Left leg   GERD (gastroesophageal reflux disease)    H/O hiatal hernia    repairsed   HTN (hypertension)    Neuropathy of foot, left    OSA on CPAP    Osteopenia    Osteoporosis    Reflux    Sciatic nerve disease    Sciatic pain    RIGHT LEG / BACK PAIN- PT ON OXYCONTIN 3 TIMES A DAY--STATES MUST [place] TO AVOID WITHDRAWAL   Sleep apnea 04/12/2012   cpap   Squamous carcinoma     PAST SURGICAL HISTORY: Past Surgical History:  Procedure Laterality Date   ABDOMINAL HYSTERECTOMY     BREAST SURGERY     Fibroma, right   EYE SURGERY     "wrinkle in left eye retina"   HERNIA REPAIR     HIATAL HERNIA REPAIR  04/17/2012   Procedure: LAPAROSCOPIC REPAIR OF HIATAL HERNIA;  Surgeon: Pedro Earls, MD;  Location: [facility];  Service: General;  Laterality: N/A;  Large Hiatus Hernia   INTRAOPERATIVE ARTERIOGRAM     NASAL SEPTOPLASTY W/ TURBINOPLASTY Bilateral 12/03/2018   Procedure: NASAL SEPTOPLASTY WITH BILATERAL TURBINATE REDUCTION;  Surgeon: Rozetta Nunnery, MD;  Location: [facility];  Service: ENT;  Laterality: Bilateral;   neuro stimulator     THORACIC AREA-BATTERY IN RIGHT HIP PT TURNS OFF AND ON WITH MAGNET-PT STATES SHE IS PAIN FREE WHEN LYING DOWN -BUT WHEN SITTING OR STANDING SHE EXPERIENCES TINGLING AND PAIN DOWN RIGHT LEG-SCIATIC PAIN   SPINAL FUSION     8 level  LUMBAR/THORACIC   TOTAL HIP ARTHROPLASTY     left   TOTAL HIP ARTHROPLASTY Right 03/29/2017   Procedure: RIGHT TOTAL HIP ARTHROPLASTY ANTERIOR APPROACH;  Surgeon: Gaynelle Arabian, MD;  Location: [facility];  Service: Orthopedics;  Laterality: Right;   TOTAL VAGINAL HYSTERECTOMY  1993    TUBAL LIGATION     verteboplasty      FAMILY HISTORY: Family History  Problem Relation Age of Onset   Hypertension Mother    COPD Mother    Heart disease Father    Hypertension Father    Skin cancer Father    Breast cancer Maternal Grandmother    Colon cancer Neg Hx     SOCIAL HISTORY: Social History   Socioeconomic History   Marital status: Married    Spouse name: Not on file  Number of children: 2   Years of education: Not on file   Highest education level: Not on file  Occupational History   Occupation: retired    Fish farm manager: RETIRED  Tobacco Use   Smoking status: Former    Types: Cigarettes   Smokeless tobacco: Never   Tobacco comments:    QUIT SMOKING IN HER EARLY 40'S  AND WAS NEVER A HEAVY SMOKER  Vaping Use   Vaping Use: Never used  Substance and Sexual Activity   Alcohol use: Yes    Alcohol/week: 1.0 standard drink    Types: 1 Standard drinks or equivalent per week    Comment: rarely   Drug use: No   Sexual activity: Never    Birth control/protection: Surgical    Comment: DECLINED INSURCNACE QUESTIONS  Other Topics Concern   Not on file  Social History Narrative   Not on file   Social Determinants of Health   Financial Resource Strain: Not on file  Food Insecurity: Not on file  Transportation Needs: Not on file  Physical Activity: Not on file  Stress: Not on file  Social Connections: Not on file  Intimate Partner Violence: Not on file      PHYSICAL EXAM  Vitals:   12/30/21 1006  BP: (!) 143/85  Pulse: 81  Weight: 157 lb (71.2 kg)  Height: 5\' 3"  (1.6 m)   Body mass index is 27.81 kg/m.  Generalized: Well developed, in no acute distress  Chest: Lungs clear to auscultation bilaterally  Neurological examination  Mentation: Alert oriented to time, place, history taking. Follows all commands speech and language fluent Cranial nerve II-XII: Extraocular movements were full, visual field were full on confrontational test Head turning  and shoulder shrug  were normal and symmetric. Motor: The motor testing reveals 5 over 5 strength of all 4 extremities. Good symmetric motor tone is noted throughout.  Sensory: Sensory testing is intact to soft touch on all 4 extremities. No evidence of extinction is noted.  Gait and station: Gait is normal.    DIAGNOSTIC DATA (LABS, IMAGING, TESTING) - I reviewed patient records, labs, notes, testing and imaging myself where available.  Lab Results  Component Value Date   WBC 5.5 09/14/2021   HGB 12.1 09/14/2021   HCT 37.4 09/14/2021   MCV 96.9 09/14/2021   PLT 259 09/14/2021      Component Value Date/Time   NA 138 09/14/2021 1010   K 4.0 09/14/2021 1010   CL 105 09/14/2021 1010   CO2 26 09/14/2021 1010   GLUCOSE 99 09/14/2021 1010   BUN 17 09/14/2021 1010   CREATININE 0.97 09/14/2021 1010   CALCIUM 9.6 09/14/2021 1010   PROT 6.9 03/22/2017 1440   ALBUMIN 4.1 03/22/2017 1440   AST 26 03/22/2017 1440   ALT 16 03/22/2017 1440   ALKPHOS 73 03/22/2017 1440   BILITOT 0.6 03/22/2017 1440   GFRNONAA 57 (L) 09/14/2021 1010   GFRAA 53 (L) 11/29/2018 0900     ASSESSMENT AND PLAN 88 y.o. year old female  has a past medical history of Anxiety, Arthritis, Basal cell carcinoma, Cervical arthritis, Complication of anesthesia, Diverticulosis, DVT (deep venous thrombosis) ([place]), GERD (gastroesophageal reflux disease), H/O hiatal hernia, HTN (hypertension), Neuropathy of foot, left, OSA on CPAP, Osteopenia, Osteoporosis, Reflux, Sciatic nerve disease, Sciatic pain, Sleep apnea (04/12/2012), and Squamous carcinoma. here with:  OSA on CPAP  - CPAP compliance excellent - Good treatment of AHI  - Encourage patient to use CPAP nightly and > 4 hours  each night - F/U in 1 year or sooner if needed     Ward Givens, MSN, NP-C 12/30/2021, 10:05 AM [facility] [address] [place], [place] [address] [phone]

## 2022-01-04 DIAGNOSIS — Z48810 Encounter for surgical aftercare following surgery on the sense organs: Secondary | ICD-10-CM | POA: Diagnosis not present

## 2022-01-04 DIAGNOSIS — Z79899 Other long term (current) drug therapy: Secondary | ICD-10-CM | POA: Diagnosis not present

## 2022-01-17 ENCOUNTER — Ambulatory Visit: Payer: Medicare Other | Admitting: Interventional Cardiology

## 2022-01-19 ENCOUNTER — Other Ambulatory Visit: Payer: Self-pay

## 2022-01-19 ENCOUNTER — Encounter: Payer: Self-pay | Admitting: Interventional Cardiology

## 2022-01-19 ENCOUNTER — Ambulatory Visit (INDEPENDENT_AMBULATORY_CARE_PROVIDER_SITE_OTHER): Payer: Medicare Other | Admitting: Interventional Cardiology

## 2022-01-19 VITALS — BP 100/60 | HR 90 | Ht 63.0 in | Wt 160.0 lb

## 2022-01-19 DIAGNOSIS — N1831 Chronic kidney disease, stage 3a: Secondary | ICD-10-CM | POA: Diagnosis not present

## 2022-01-19 DIAGNOSIS — I119 Hypertensive heart disease without heart failure: Secondary | ICD-10-CM | POA: Diagnosis not present

## 2022-01-19 DIAGNOSIS — I491 Atrial premature depolarization: Secondary | ICD-10-CM | POA: Diagnosis not present

## 2022-01-19 NOTE — Patient Instructions (Addendum)
Medication Instructions:  ?Your physician recommends that you continue on your current medications as directed. Please refer to the Current Medication list given to you today. ? ?*If you need a refill on your cardiac medications before your next appointment, please call your pharmacy* ? ? ?Lab Work: ?none ?If you have labs (blood work) drawn today and your tests are completely normal, you will receive your results only by: ?MyChart Message (if you have MyChart) OR ?A paper copy in the mail ?If you have any lab test that is abnormal or we need to change your treatment, we will call you to review the results. ? ? ?Testing/Procedures: ?none ? ? ?Follow-Up: ?At [facility], you and your health needs are our priority.  As part of our continuing mission to provide you with exceptional heart care, we have created designated Provider Care Teams.  These Care Teams include your primary Cardiologist (physician) and Advanced Practice Providers (APPs -  Physician Assistants and Nurse Practitioners) who all work together to provide you with the care you need, when you need it. ? ?We recommend signing up for the patient portal called "MyChart".  Sign up information is provided on this After Visit Summary.  MyChart is used to connect with patients for Virtual Visits (Telemedicine).  Patients are able to view lab/test results, encounter notes, upcoming appointments, etc.  Non-urgent messages can be sent to your provider as well.   ?To learn more about what you can do with MyChart, go to [email or web address].   ? ?Your next appointment:   ?As needed ? ?The format for your next appointment:   ?In Person ? ?Provider:   ?Larae Grooms, MD   ? ? ?Other Instructions ? ? ?

## 2022-01-19 NOTE — Progress Notes (Signed)
?  ?Cardiology Office Note ? ? ?Date:  01/19/2022  ? ?ID:  Stacy Parker, DOB 11-29-1933, MRN [id number] ? ?PCP:  Burnard Bunting, MD  ? ? ?No chief complaint on file. ? ?Irregular heart beat ? ?Wt Readings from Last 3 Encounters:  ?01/19/22 160 lb (72.6 kg)  ?12/30/21 157 lb (71.2 kg)  ?09/14/21 156 lb 8 oz (71 kg)  ?  ? ?  ?History of Present Illness: ?Stacy Parker is a 88 y.o. female who is being seen today for the evaluation of irregular at the request of Reginold Agent, NP.  ? ?She had eyelid surgery in 12/2021.  She noted an irregular heartbeat by the beeping.  ? ?Her Kardia device reported possible AFib. ECG done at PMD showed NSR.  ? ?Denies : Chest pain. Dizziness. Leg edema. Nitroglycerin use. Orthopnea. Palpitations. Paroxysmal nocturnal dyspnea. Syncope.  ? ?Back pain limits walking.  She does  ? ? ?Past Medical History:  ?Diagnosis Date  ? Anxiety   ? Arthritis   ? Basal cell carcinoma   ? Cervical arthritis   ? PT STATES CERVICAL SPINE BADLY DEGENERATED--SHE DOES NOT HAVE NECK PAIN -BUT TOLD HER NECK IS FRAGILE  ? Complication of anesthesia   ? myalgia   ? Diverticulosis   ? DVT (deep venous thrombosis) ([place])   ? Left leg  ? GERD (gastroesophageal reflux disease)   ? H/O hiatal hernia   ? repairsed  ? HTN (hypertension)   ? Neuropathy of foot, left   ? OSA on CPAP   ? Osteopenia   ? Osteoporosis   ? Reflux   ? Sciatic nerve disease   ? Sciatic pain   ? RIGHT LEG / BACK PAIN- PT ON OXYCONTIN 3 TIMES A DAY--STATES MUST CONTINUE WHILE IN HOSPITAL TO AVOID WITHDRAWAL  ? Sleep apnea 04/12/2012  ? cpap  ? Squamous carcinoma   ? ? ?Past Surgical History:  ?Procedure Laterality Date  ? ABDOMINAL HYSTERECTOMY    ? BREAST SURGERY    ? Fibroma, right  ? EYE SURGERY    ? "wrinkle in left eye retina"  ? HERNIA REPAIR    ? HIATAL HERNIA REPAIR  04/17/2012  ? Procedure: LAPAROSCOPIC REPAIR OF HIATAL HERNIA;  Surgeon: Pedro Earls, MD;  Location: [facility];  Service: General;  Laterality: N/A;  Large  Hiatus Hernia  ? INTRAOPERATIVE ARTERIOGRAM    ? NASAL SEPTOPLASTY W/ TURBINOPLASTY Bilateral 12/03/2018  ? Procedure: NASAL SEPTOPLASTY WITH BILATERAL TURBINATE REDUCTION;  Surgeon: Rozetta Nunnery, MD;  Location: [facility];  Service: ENT;  Laterality: Bilateral;  ? neuro stimulator    ? THORACIC AREA-BATTERY IN RIGHT HIP PT TURNS OFF AND ON WITH MAGNET-PT STATES SHE IS PAIN FREE WHEN LYING DOWN -BUT WHEN SITTING OR STANDING SHE EXPERIENCES TINGLING AND PAIN DOWN RIGHT LEG-SCIATIC PAIN  ? SPINAL FUSION    ? 8 level  LUMBAR/THORACIC  ? TOTAL HIP ARTHROPLASTY    ? left  ? TOTAL HIP ARTHROPLASTY Right 03/29/2017  ? Procedure: RIGHT TOTAL HIP ARTHROPLASTY ANTERIOR APPROACH;  Surgeon: Gaynelle Arabian, MD;  Location: [facility];  Service: Orthopedics;  Laterality: Right;  ? TOTAL VAGINAL HYSTERECTOMY  1993  ? TUBAL LIGATION    ? verteboplasty    ? ? ? ?Current Outpatient Medications  ?Medication Sig Dispense Refill  ? Ascorbic Acid (VITAMIN C PO) Take 1 tablet by mouth daily.    ? aspirin EC 81 MG tablet Take 81 mg by mouth 4 (four) times a week.  Sundays, Tuesdays, Thursdays, Saturdays.    ? BIOTIN PO Take 6,000 mcg by mouth daily.    ? Black Pepper-Turmeric (TURMERIC COMPLEX/BLACK PEPPER PO) Take 1 capsule by mouth daily.    ? citalopram (CELEXA) 40 MG tablet Take 40 mg by mouth daily.    ? docusate sodium (COLACE) 100 MG capsule Take 100 mg by mouth daily.    ? gabapentin (NEURONTIN) 300 MG capsule Take 300 mg by mouth 2 (two) times daily.    ? hydrocortisone (ANUSOL-HC) 2.5 % rectal cream Place 1 application rectally at bedtime. 30 g 1  ? losartan-hydrochlorothiazide (HYZAAR) 100-25 MG tablet Take 1 tablet by mouth every evening.     ? Magnesium 250 MG TABS Take 250 mg by mouth daily.    ? Melatonin 5 MG TABS Take 5 mg by mouth at bedtime.     ? methocarbamol (ROBAXIN) 500 MG tablet Take 500 mg by mouth daily as needed for muscle spasms.    ? Multiple Minerals-Vitamins (CALCIUM-MAGNESIUM-ZINC-D3 PO) Take 1 tablet by mouth  daily.    ? Multiple Vitamin (MULTIVITAMIN WITH MINERALS) TABS tablet Take 1 tablet by mouth daily. Senior Multivitamin    ? Multiple Vitamins-Minerals (PRESERVISION AREDS 2 PO) Take 1 tablet by mouth 2 (two) times daily.    ? Omega-3 Fatty Acids (FISH OIL) 1200 MG CAPS Take 1,200 mg by mouth 2 (two) times daily.    ? OVER THE COUNTER MEDICATION Take 1 capsule by mouth daily. Saffron    ? pantoprazole (PROTONIX) 40 MG tablet Take 40 mg by mouth daily.    ? Polyethyl Glycol-Propyl Glycol 0.4-0.3 % SOLN Place 1 drop into both eyes 2 (two) times daily as needed (dry eyes).    ? Sodium Chloride-Xylitol (XLEAR SINUS CARE SPRAY NA) Place 2 sprays into the nose at bedtime.    ? ?No current facility-administered medications for this visit.  ? ? ?Allergies:   Ace inhibitors, Alendronate sodium, Ambien  [zolpidem tartrate], Boniva [ibandronic acid], Ceftin  [cefuroxime axetil], Cymbalta [duloxetine hcl], Dicloxacillin, Doxycycline, Fosamax [alendronate], Metronidazole, Phenergan [promethazine hcl], and Succinylcholine  ? ? ?Social History:  The patient  reports that she has quit smoking. Her smoking use included cigarettes. She has never used smokeless tobacco. She reports current alcohol use of about 1.0 standard drink per week. She reports that she does not use drugs.  ? ?Family History:  The patient's family history includes Breast cancer in her maternal grandmother; COPD in her mother; Dementia in her brother; Heart disease in her father; Hypertension in her father and mother; Skin cancer in her father.  ? ? ?ROS:  Please see the history of present illness.   Otherwise, review of systems are positive for fatigue with aging.   All other systems are reviewed and negative.  ? ? ?PHYSICAL EXAM: ?VS:  BP 100/60   Pulse 90   Ht '5\' 3"'$  (1.6 m)   Wt 160 lb (72.6 kg)   SpO2 97%   BMI 28.34 kg/m?  , BMI Body mass index is 28.34 kg/m?. ?GEN: Well nourished, well developed, in no acute distress ?HEENT: normal ?Neck: no JVD,  carotid bruits, or masses ?Cardiac: RRR, premature beats; no murmurs, rubs, or gallops,no edema  ?Respiratory:  clear to auscultation bilaterally, normal work of breathing ?GI: soft, nontender, nondistended, + BS ?MS: no deformity or atrophy ?Skin: warm and dry, no rash ?Neuro:  Strength and sensation are intact ?Psych: euthymic mood, full affect ? ? ?EKG:   ?The ekg ordered today demonstrates  normal sinus rhythm, left bundle branch block, PACs ? ? ?Recent Labs: ?09/14/2021: BUN 17; Creatinine, Ser 0.97; Hemoglobin 12.1; Platelets 259; Potassium 4.0; Sodium 138  ? ?Lipid Panel ?No results found for: CHOL, TRIG, HDL, CHOLHDL, VLDL, LDLCALC, LDLDIRECT ?  ?Other studies Reviewed: ?Additional studies/ records that were reviewed today with results demonstrating: I reviewed all of her cardia mobile strips from her phone.  All of the possible atrial fibrillation strips were in fact sinus rhythm with PACs.  No sustained arrhythmias. ? ? ?ASSESSMENT AND PLAN: ? ?PAC noted on ECG today.  This seems to be the arrhythmia her cardia mobile device detected.  She has no symptoms.  Her heart rate is only fast when she is exercising.  I Asked her to watch for resting tachycardia.  If she feels palpitations, she will let us know her.  Her frequent PACs and age may increase her risk of atrial fibrillation starting. ?Hypertensive heart disease: The current medical regimen is effective;  continue present plan and medications. ?Chronic kidney disease: Stay well-hydrated.  Avoid nephrotoxins. ? ? ?Current medicines are reviewed at length with the patient today.  The patient concerns regarding her medicines were addressed. ? ?The following changes have been made:  No change ? ?Labs/ tests ordered today include:  ?No orders of the defined types were placed in this encounter. ? ? ?Recommend 150 minutes/week of aerobic exercise ?Low fat, low carb, high fiber diet recommended ? ?Disposition:   FU as needed; I offered regular follow-up but she  preferred to be seen as needed ? ? ?Signed, ?Larae Grooms, MD  ?01/19/2022 2:23 PM    ?[facility] ?[address], [place], [place]  [address] ?Phone: [phone]; Fax: [phone]

## 2022-02-10 DIAGNOSIS — M810 Age-related osteoporosis without current pathological fracture: Secondary | ICD-10-CM | POA: Diagnosis not present

## 2022-02-10 DIAGNOSIS — I10 Essential (primary) hypertension: Secondary | ICD-10-CM | POA: Diagnosis not present

## 2022-02-14 DIAGNOSIS — Z0000 Encounter for general adult medical examination without abnormal findings: Secondary | ICD-10-CM | POA: Diagnosis not present

## 2022-02-14 DIAGNOSIS — L659 Nonscarring hair loss, unspecified: Secondary | ICD-10-CM | POA: Diagnosis not present

## 2022-02-14 DIAGNOSIS — E663 Overweight: Secondary | ICD-10-CM | POA: Diagnosis not present

## 2022-02-14 DIAGNOSIS — N1832 Chronic kidney disease, stage 3b: Secondary | ICD-10-CM | POA: Diagnosis not present

## 2022-02-14 DIAGNOSIS — I129 Hypertensive chronic kidney disease with stage 1 through stage 4 chronic kidney disease, or unspecified chronic kidney disease: Secondary | ICD-10-CM | POA: Diagnosis not present

## 2022-02-14 DIAGNOSIS — K219 Gastro-esophageal reflux disease without esophagitis: Secondary | ICD-10-CM | POA: Diagnosis not present

## 2022-02-14 DIAGNOSIS — Z1339 Encounter for screening examination for other mental health and behavioral disorders: Secondary | ICD-10-CM | POA: Diagnosis not present

## 2022-02-14 DIAGNOSIS — Z1331 Encounter for screening for depression: Secondary | ICD-10-CM | POA: Diagnosis not present

## 2022-02-15 DIAGNOSIS — Z1212 Encounter for screening for malignant neoplasm of rectum: Secondary | ICD-10-CM | POA: Diagnosis not present

## 2022-02-21 DIAGNOSIS — L821 Other seborrheic keratosis: Secondary | ICD-10-CM | POA: Diagnosis not present

## 2022-02-21 DIAGNOSIS — Z85828 Personal history of other malignant neoplasm of skin: Secondary | ICD-10-CM | POA: Diagnosis not present

## 2022-02-21 DIAGNOSIS — L820 Inflamed seborrheic keratosis: Secondary | ICD-10-CM | POA: Diagnosis not present

## 2022-02-21 DIAGNOSIS — L570 Actinic keratosis: Secondary | ICD-10-CM | POA: Diagnosis not present

## 2022-03-09 DIAGNOSIS — Z20822 Contact with and (suspected) exposure to covid-19: Secondary | ICD-10-CM | POA: Diagnosis not present

## 2022-03-10 DIAGNOSIS — Z20822 Contact with and (suspected) exposure to covid-19: Secondary | ICD-10-CM | POA: Diagnosis not present

## 2022-03-30 DIAGNOSIS — I129 Hypertensive chronic kidney disease with stage 1 through stage 4 chronic kidney disease, or unspecified chronic kidney disease: Secondary | ICD-10-CM | POA: Diagnosis not present

## 2022-04-29 ENCOUNTER — Telehealth: Payer: Self-pay | Admitting: Interventional Cardiology

## 2022-04-29 NOTE — Telephone Encounter (Signed)
Left message to call office on Monday

## 2022-05-02 NOTE — Telephone Encounter (Signed)
I spoke with patient and gave her information from pharmacist.  She does not want to have LP (a) checked at this time.

## 2022-05-02 NOTE — Telephone Encounter (Signed)
Pt returning nurse's call. Please advise

## 2022-05-02 NOTE — Telephone Encounter (Signed)
Patient was returning call. Please advise ?

## 2022-05-02 NOTE — Telephone Encounter (Signed)
Lp(a) is most helpful in primary prevention patients with additional risk factors like strong family history of heart disease where we're trying to determine how aggressively to treat their cholesterol. It's an independent risk factor for heart disease that's genetically-driven. It does look like pt is primary prevention - would be fine to check if she'd like. Do not recommend starting niacin though. While it can lower Lp(a), it lacks any CV benefit and has quite a few side effects. There are no current FDA approved medications for Lp(a) lowering, but there are some in phase 3 clinical trials. If she does have Lp(a) checked and it's elevated, recommendation would be to start statin therapy to lower LDL and CV risk.

## 2022-05-02 NOTE — Telephone Encounter (Signed)
Left message to call office

## 2022-05-02 NOTE — Telephone Encounter (Signed)
I spoke with patient and gave her information from Dr Eldridge Dace.  Patient states she read about a certain type of Niacin that could help if LP (a) is elevated.  Before having lab work checked she would like to know if Niacin may be indicated for her.  If there is no treatment for elevated LP (a) she does not want to have it checked.  Patient states her cholesterol numbers have been good and she is not on any cholesterol medication

## 2022-05-18 DIAGNOSIS — M549 Dorsalgia, unspecified: Secondary | ICD-10-CM | POA: Diagnosis not present

## 2022-05-18 DIAGNOSIS — M461 Sacroiliitis, not elsewhere classified: Secondary | ICD-10-CM | POA: Diagnosis not present

## 2022-05-18 DIAGNOSIS — G894 Chronic pain syndrome: Secondary | ICD-10-CM | POA: Diagnosis not present

## 2022-06-23 DIAGNOSIS — M5459 Other low back pain: Secondary | ICD-10-CM | POA: Diagnosis not present

## 2022-06-27 DIAGNOSIS — M5459 Other low back pain: Secondary | ICD-10-CM | POA: Diagnosis not present

## 2022-06-29 DIAGNOSIS — M5459 Other low back pain: Secondary | ICD-10-CM | POA: Diagnosis not present

## 2022-07-01 DIAGNOSIS — M5459 Other low back pain: Secondary | ICD-10-CM | POA: Diagnosis not present

## 2022-07-04 DIAGNOSIS — M5459 Other low back pain: Secondary | ICD-10-CM | POA: Diagnosis not present

## 2022-07-06 DIAGNOSIS — M5459 Other low back pain: Secondary | ICD-10-CM | POA: Diagnosis not present

## 2022-07-12 DIAGNOSIS — M5459 Other low back pain: Secondary | ICD-10-CM | POA: Diagnosis not present

## 2022-07-13 DIAGNOSIS — M5459 Other low back pain: Secondary | ICD-10-CM | POA: Diagnosis not present

## 2022-07-15 DIAGNOSIS — M5459 Other low back pain: Secondary | ICD-10-CM | POA: Diagnosis not present

## 2022-07-20 DIAGNOSIS — M5459 Other low back pain: Secondary | ICD-10-CM | POA: Diagnosis not present

## 2022-07-22 DIAGNOSIS — M5459 Other low back pain: Secondary | ICD-10-CM | POA: Diagnosis not present

## 2022-07-25 DIAGNOSIS — M5459 Other low back pain: Secondary | ICD-10-CM | POA: Diagnosis not present

## 2022-07-27 DIAGNOSIS — M5459 Other low back pain: Secondary | ICD-10-CM | POA: Diagnosis not present

## 2022-08-13 DIAGNOSIS — Z23 Encounter for immunization: Secondary | ICD-10-CM | POA: Diagnosis not present

## 2022-08-22 DIAGNOSIS — D225 Melanocytic nevi of trunk: Secondary | ICD-10-CM | POA: Diagnosis not present

## 2022-08-22 DIAGNOSIS — D485 Neoplasm of uncertain behavior of skin: Secondary | ICD-10-CM | POA: Diagnosis not present

## 2022-08-22 DIAGNOSIS — C4441 Basal cell carcinoma of skin of scalp and neck: Secondary | ICD-10-CM | POA: Diagnosis not present

## 2022-08-22 DIAGNOSIS — Z85828 Personal history of other malignant neoplasm of skin: Secondary | ICD-10-CM | POA: Diagnosis not present

## 2022-08-22 DIAGNOSIS — L570 Actinic keratosis: Secondary | ICD-10-CM | POA: Diagnosis not present

## 2022-08-22 DIAGNOSIS — L821 Other seborrheic keratosis: Secondary | ICD-10-CM | POA: Diagnosis not present

## 2022-08-22 DIAGNOSIS — L718 Other rosacea: Secondary | ICD-10-CM | POA: Diagnosis not present

## 2022-09-06 DIAGNOSIS — Z23 Encounter for immunization: Secondary | ICD-10-CM | POA: Diagnosis not present

## 2022-09-14 DIAGNOSIS — Z961 Presence of intraocular lens: Secondary | ICD-10-CM | POA: Diagnosis not present

## 2022-09-14 DIAGNOSIS — H401124 Primary open-angle glaucoma, left eye, indeterminate stage: Secondary | ICD-10-CM | POA: Diagnosis not present

## 2022-09-14 DIAGNOSIS — H35361 Drusen (degenerative) of macula, right eye: Secondary | ICD-10-CM | POA: Diagnosis not present

## 2022-09-14 DIAGNOSIS — H04123 Dry eye syndrome of bilateral lacrimal glands: Secondary | ICD-10-CM | POA: Diagnosis not present

## 2022-09-14 DIAGNOSIS — H35372 Puckering of macula, left eye: Secondary | ICD-10-CM | POA: Diagnosis not present

## 2022-09-14 DIAGNOSIS — H353212 Exudative age-related macular degeneration, right eye, with inactive choroidal neovascularization: Secondary | ICD-10-CM | POA: Diagnosis not present

## 2022-09-14 DIAGNOSIS — H353121 Nonexudative age-related macular degeneration, left eye, early dry stage: Secondary | ICD-10-CM | POA: Diagnosis not present

## 2022-10-28 ENCOUNTER — Encounter: Payer: Self-pay | Admitting: Cardiology

## 2022-10-28 ENCOUNTER — Ambulatory Visit: Payer: Medicare Other | Attending: Cardiology | Admitting: Cardiology

## 2022-10-28 ENCOUNTER — Telehealth: Payer: Self-pay | Admitting: Interventional Cardiology

## 2022-10-28 VITALS — BP 114/52 | HR 86 | Ht 63.0 in | Wt 158.0 lb

## 2022-10-28 DIAGNOSIS — R002 Palpitations: Secondary | ICD-10-CM | POA: Diagnosis not present

## 2022-10-28 NOTE — Telephone Encounter (Signed)
Spoke with pt who reports her [facility] and new BP machine advise Atrial Fib for the past 3-4 days.  Pt states she does feel differently. She complains of feeling fatigued and anxious.  Denies current CP, SOB or dizziness.  Current HR is 86.  She is not anticoagulated.  Pt last saw Dr Irish Lack 01/2022. Appointment scheduled with Dr Percival Spanish, DOD at [place] office 10/28/2022 at 430pm.  Reviewed ED precautions.  Pt verbalizes understanding and agrees with current plan.

## 2022-10-28 NOTE — Patient Instructions (Signed)
Medication Instructions:  Continue same medications *If you need a refill on your cardiac medications before your next appointment, please call your pharmacy*   Lab Work: None ordered   Testing/Procedures: 3 day ZIO Heart monitor   Follow-Up: At [facility], you and your health needs are our priority.  As part of our continuing mission to provide you with exceptional heart care, we have created designated Provider Care Teams.  These Care Teams include your primary Cardiologist (physician) and Advanced Practice Providers (APPs -  Physician Assistants and Nurse Practitioners) who all work together to provide you with the care you need, when you need it.  We recommend signing up for the patient portal called "MyChart".  Sign up information is provided on this After Visit Summary.  MyChart is used to connect with patients for Virtual Visits (Telemedicine).  Patients are able to view lab/test results, encounter notes, upcoming appointments, etc.  Non-urgent messages can be sent to your provider as well.   To learn more about what you can do with MyChart, go to [email or web address].    Your next appointment:  1 month    The format for your next appointment: Office     Provider:  Dr.Varanasi    Important Information About Sugar

## 2022-10-28 NOTE — Progress Notes (Signed)
Cardiology Office Note   Date:  10/28/2022   ID:  Stacy Parker, DOB 09-13-1934, MRN [id number]  PCP:  Burnard Bunting, MD  Cardiologist:   Larae Grooms, MD Referring:  Self  Chief Complaint  Patient presents with   Palpitations      History of Present Illness: Stacy Parker is a 88 y.o. female who presents for evaluation of possible atrial fibrillation.  She was added to my schedule today to discuss this.    She saw Dr. Irish Lack for this in the past.  She had PACs noted.  He had her DAPT a Kardia.  I did look through this and it does not look like it is atrial fibrillation on some of her strips.  Today because of her stimulator we are not able to get a clean baseline EKG to assess whether this is fibrillation.  I suspect that currently she is in sinus rhythm with some PACs.  However, she has felt anxious when her device has suggested atrial fibrillation.  She is very aware and thinks this just feels different.  She is not describing presyncope or syncope.  She is not having chest pressure, neck or arm discomfort.  She is not having weight gain or edema.  She gets around and does her activities at home.  She does have some back pain.  She does 3 days/week yoga and some water exercises.   Past Medical History:  Diagnosis Date   Anxiety    Arthritis    Basal cell carcinoma    Cervical arthritis    PT STATES CERVICAL SPINE BADLY DEGENERATED--SHE DOES NOT HAVE NECK PAIN -BUT TOLD HER NECK IS FRAGILE   Complication of anesthesia    myalgia    Diverticulosis    DVT (deep venous thrombosis) ([facility])    Left leg   GERD (gastroesophageal reflux disease)    H/O hiatal hernia    repairsed   HTN (hypertension)    Neuropathy of foot, left    OSA on CPAP    Osteopenia    Osteoporosis    Reflux    Sciatic nerve disease    Sciatic pain    RIGHT LEG / BACK PAIN- PT ON OXYCONTIN 3 TIMES A DAY--STATES MUST [place] TO AVOID WITHDRAWAL   Sleep apnea  04/12/2012   cpap   Squamous carcinoma     Past Surgical History:  Procedure Laterality Date   ABDOMINAL HYSTERECTOMY     BREAST SURGERY     Fibroma, right   EYE SURGERY     "wrinkle in left eye retina"   HERNIA REPAIR     HIATAL HERNIA REPAIR  04/17/2012   Procedure: LAPAROSCOPIC REPAIR OF HIATAL HERNIA;  Surgeon: Pedro Earls, MD;  Location: [facility];  Service: General;  Laterality: N/A;  Large Hiatus Hernia   INTRAOPERATIVE ARTERIOGRAM     NASAL SEPTOPLASTY W/ TURBINOPLASTY Bilateral 12/03/2018   Procedure: NASAL SEPTOPLASTY WITH BILATERAL TURBINATE REDUCTION;  Surgeon: Rozetta Nunnery, MD;  Location: [facility];  Service: ENT;  Laterality: Bilateral;   neuro stimulator     THORACIC AREA-BATTERY IN RIGHT HIP PT TURNS OFF AND ON WITH MAGNET-PT STATES SHE IS PAIN FREE WHEN LYING DOWN -BUT WHEN SITTING OR STANDING SHE EXPERIENCES TINGLING AND PAIN DOWN RIGHT LEG-SCIATIC PAIN   SPINAL FUSION     8 level  LUMBAR/THORACIC   TOTAL HIP ARTHROPLASTY     left   TOTAL HIP ARTHROPLASTY Right 03/29/2017   Procedure:  RIGHT TOTAL HIP ARTHROPLASTY ANTERIOR APPROACH;  Surgeon: Gaynelle Arabian, MD;  Location: [facility];  Service: Orthopedics;  Laterality: Right;   TOTAL VAGINAL HYSTERECTOMY  1993   TUBAL LIGATION     verteboplasty       Current Outpatient Medications  Medication Sig Dispense Refill   Ascorbic Acid (VITAMIN C PO) Take 1 tablet by mouth daily.     aspirin EC 81 MG tablet Take 81 mg by mouth 4 (four) times a week. Sundays, Tuesdays, Thursdays, Saturdays.     BIOTIN PO Take 6,000 mcg by mouth daily.     Black Pepper-Turmeric (TURMERIC COMPLEX/BLACK PEPPER PO) Take 1 capsule by mouth daily.     citalopram (CELEXA) 40 MG tablet Take 40 mg by mouth daily.     docusate sodium (COLACE) 100 MG capsule Take 100 mg by mouth daily.     gabapentin (NEURONTIN) 300 MG capsule Take 300 mg by mouth 2 (two) times daily.     losartan-hydrochlorothiazide (HYZAAR) 100-25 MG tablet Take 1 tablet by  mouth every evening.      Magnesium 250 MG TABS Take 250 mg by mouth daily.     Melatonin 5 MG TABS Take 5 mg by mouth at bedtime.      methocarbamol (ROBAXIN) 500 MG tablet Take 500 mg by mouth daily as needed for muscle spasms.     Multiple Minerals-Vitamins (CALCIUM-MAGNESIUM-ZINC-D3 PO) Take 1 tablet by mouth daily.     Multiple Vitamin (MULTIVITAMIN WITH MINERALS) TABS tablet Take 1 tablet by mouth daily. Senior Multivitamin     Multiple Vitamins-Minerals (PRESERVISION AREDS 2 PO) Take 1 tablet by mouth 2 (two) times daily.     Omega-3 Fatty Acids (FISH OIL) 1200 MG CAPS Take 1,200 mg by mouth 2 (two) times daily.     OVER THE COUNTER MEDICATION Take 1 capsule by mouth daily. Saffron     Polyethyl Glycol-Propyl Glycol 0.4-0.3 % SOLN Place 1 drop into both eyes 2 (two) times daily as needed (dry eyes).     Sodium Chloride-Xylitol (XLEAR SINUS CARE SPRAY NA) Place 2 sprays into the nose at bedtime.     pantoprazole (PROTONIX) 40 MG tablet Take 40 mg by mouth daily.     No current facility-administered medications for this visit.    Allergies:   Ace inhibitors, Alendronate sodium, Ambien  [zolpidem tartrate], Boniva [ibandronic acid], Ceftin  [cefuroxime axetil], Cymbalta [duloxetine hcl], Dicloxacillin, Doxycycline, Fosamax [alendronate], Metronidazole, Phenergan [promethazine hcl], and Succinylcholine    Social History:  The patient  reports that she has quit smoking. Her smoking use included cigarettes. She has never used smokeless tobacco. She reports current alcohol use of about 1.0 standard drink of alcohol per week. She reports that she does not use drugs.   Family History:  The patient's family history includes Breast cancer in her maternal grandmother; COPD in her mother; Dementia in her brother; Heart disease in her father; Hypertension in her father and mother; Skin cancer in her father.    ROS:  Please see the history of present illness.   Otherwise, review of systems are  positive for none.   All other systems are reviewed and negative.    PHYSICAL EXAM: VS:  BP (!) 114/52 (BP Location: Left Arm, Patient Position: Sitting, Cuff Size: Normal)   Pulse 86   Ht '5\' 3"'$  (1.6 m)   Wt 158 lb (71.7 kg)   BMI 27.99 kg/m  , BMI Body mass index is 27.99 kg/m. GENERAL:  Well appearing HEENT:  Pupils equal round and reactive, fundi not visualized, oral mucosa unremarkable NECK:  No jugular venous distention, waveform within normal limits, carotid upstroke brisk and symmetric, no bruits, no thyromegaly LYMPHATICS:  No cervical, inguinal adenopathy LUNGS:  Clear to auscultation bilaterally BACK:  No CVA tenderness CHEST:  Unremarkable HEART:  PMI not displaced or sustained,S1 and S2 within normal limits, no S3, no S4, no clicks, no rubs, no murmurs ABD:  Flat, positive bowel sounds normal in frequency in pitch, no bruits, no rebound, no guarding, no midline pulsatile mass, no hepatomegaly, no splenomegaly EXT:  2 plus pulses throughout, no edema, no cyanosis no clubbing SKIN:  No rashes no nodules NEURO:  Cranial nerves II through XII grossly intact, motor grossly intact throughout PSYCH:  Cognitively intact, oriented to person place and time    EKG:  EKG is ordered today. The ekg ordered today demonstrates regular rhythm with premature atrial contractions.  Baseline artifact precludes adequate analysis but it appears to be sinus with left bundle branch block and left axis deviation   Recent Labs: No results found for requested labs within last 365 days.    Lipid Panel No results found for: "CHOL", "TRIG", "HDL", "CHOLHDL", "VLDL", "LDLCALC", "LDLDIRECT"    Wt Readings from Last 3 Encounters:  10/28/22 158 lb (71.7 kg)  01/19/22 160 lb (72.6 kg)  12/30/21 157 lb (71.2 kg)      Other studies Reviewed: Additional studies/ records that were reviewed today include:   Kardia. Review of the above records demonstrates:  Please see elsewhere in the note.      ASSESSMENT AND PLAN:  Atrial fib: I do think her Jodelle Red suggest atrial fibrillation.  I will send her a 3 day Zio patch.  I will defer decision about anticoagulation pending confirmation of this and perhaps quantification.  For now she will remain on the meds as listed.  HTN: Her blood pressure is well-controlled.  No change in therapy.    Current medicines are reviewed at length with the patient today.  The patient does not have concerns regarding medicines.  The following changes have been made:  no change  Labs/ tests ordered today include:   Orders Placed This Encounter  Procedures   LONG TERM MONITOR (3-14 DAYS)   EKG 12-Lead     Disposition:   FU with Dr. Irish Lack after the monitor.     Signed, Minus Breeding, MD  10/28/2022 5:13 PM    [facility]

## 2022-10-28 NOTE — Telephone Encounter (Signed)
Patient c/o Palpitations:  High priority if patient c/o lightheadedness, shortness of breath, or chest pain  How long have you had palpitations/irregular HR/ Afib? Are you having the symptoms now? Yes   Are you currently experiencing lightheadedness, SOB or CP? SOB  Do you have a history of afib (atrial fibrillation) or irregular heart rhythm? No   Have you checked your BP or HR? (document readings if available): 140/73  Are you experiencing any other symptoms? no

## 2022-10-28 NOTE — Telephone Encounter (Signed)
Attempted phone call to pt and left voicemail message to call [phone].

## 2022-11-01 ENCOUNTER — Ambulatory Visit: Payer: Medicare Other | Attending: Cardiology

## 2022-11-01 DIAGNOSIS — R002 Palpitations: Secondary | ICD-10-CM

## 2022-11-01 NOTE — Progress Notes (Unsigned)
Enrolled patient for a 3 day Zio XT monitor to be mailed to patients home  Dr Irish Lack to read

## 2022-11-04 ENCOUNTER — Telehealth: Payer: Self-pay | Admitting: Cardiology

## 2022-11-04 NOTE — Telephone Encounter (Signed)
Patient stated she wants to wait to wear the 3-day Zio until she has symptoms again. She stated, "I am very knowledgeable and know when to put it on." While on phone BP 117/56, P 78. Explained that she may be having an irregular rhythm and not be feeling it. She stated she always feels her heart palpitations, esp. when Jodelle Red  mobile tells her. Please advise.

## 2022-11-04 NOTE — Telephone Encounter (Signed)
Patient informed that she can let our clinic know when she will place the [place] on. She already has it at her home.

## 2022-11-04 NOTE — Telephone Encounter (Signed)
Pt states she is asymptomatic and she would like to save the heart monitor ordered by Dr. Percival Spanish until she has symptoms again. Please advise if this is okay to do.

## 2022-12-07 DIAGNOSIS — Z1231 Encounter for screening mammogram for malignant neoplasm of breast: Secondary | ICD-10-CM | POA: Diagnosis not present

## 2022-12-09 DIAGNOSIS — M461 Sacroiliitis, not elsewhere classified: Secondary | ICD-10-CM | POA: Diagnosis not present

## 2022-12-09 DIAGNOSIS — G894 Chronic pain syndrome: Secondary | ICD-10-CM | POA: Diagnosis not present

## 2022-12-22 DIAGNOSIS — Z961 Presence of intraocular lens: Secondary | ICD-10-CM | POA: Diagnosis not present

## 2022-12-22 DIAGNOSIS — H3589 Other specified retinal disorders: Secondary | ICD-10-CM | POA: Diagnosis not present

## 2022-12-22 DIAGNOSIS — G894 Chronic pain syndrome: Secondary | ICD-10-CM | POA: Diagnosis not present

## 2022-12-22 DIAGNOSIS — H401121 Primary open-angle glaucoma, left eye, mild stage: Secondary | ICD-10-CM | POA: Diagnosis not present

## 2022-12-22 DIAGNOSIS — H35361 Drusen (degenerative) of macula, right eye: Secondary | ICD-10-CM | POA: Diagnosis not present

## 2022-12-22 DIAGNOSIS — H353212 Exudative age-related macular degeneration, right eye, with inactive choroidal neovascularization: Secondary | ICD-10-CM | POA: Diagnosis not present

## 2022-12-22 DIAGNOSIS — H35372 Puckering of macula, left eye: Secondary | ICD-10-CM | POA: Diagnosis not present

## 2022-12-22 DIAGNOSIS — H353121 Nonexudative age-related macular degeneration, left eye, early dry stage: Secondary | ICD-10-CM | POA: Diagnosis not present

## 2022-12-26 DIAGNOSIS — Z978 Presence of other specified devices: Secondary | ICD-10-CM | POA: Diagnosis not present

## 2022-12-26 DIAGNOSIS — M545 Low back pain, unspecified: Secondary | ICD-10-CM | POA: Diagnosis not present

## 2023-01-04 NOTE — Progress Notes (Unsigned)
PATIENT: Stacy Parker DOB: 12-Nov-1933  REASON FOR VISIT: follow up HISTORY FROM: patient PRIMARY NEUROLOGIST: Dr. Brett Fairy  HISTORY OF PRESENT ILLNESS:  Today 01/04/23:  Stacy Parker is a 89 y.o. female with a history of ***. Returns today for follow-up.      12/30/21: Stacy Parker is an 89 year old female with a history of obstructive sleep apnea on CPAP.  She reports that the CPAP is working well for her.  Her download is below      REVIEW OF SYSTEMS: Out of a complete 14 system review of symptoms, the patient complains only of the following symptoms, and all other reviewed systems are negative.   ESS 1  ALLERGIES: Allergies  Allergen Reactions   Ace Inhibitors Other (See Comments)    Unsure of what reaction was   Alendronate Sodium    Ambien  [Zolpidem Tartrate] Diarrhea   Boniva [Ibandronic Acid] Other (See Comments)    Flu like symptoms STATES ALL THE OSTEOPOROSIS DRUGS HAVE CAUSED FLU LIKE SYMPTOMS    Ceftin  [Cefuroxime Axetil] Diarrhea and Swelling   Cymbalta [Duloxetine Hcl] Nausea Only   Dicloxacillin Diarrhea    Has patient had a PCN reaction causing immediate rash, facial/tongue/throat swelling, SOB or lightheadedness with hypotension:unsure Has patient had a PCN reaction causing severe rash involving mucus membranes or skin necrosis:unsure Has patient had a PCN reaction that required hospitalization:NO Has patient had a PCN reaction occurring within the last 10 years:unsure If all of the above answers are "NO", then may proceed with Cephalosporin use.    Doxycycline Nausea Only and Other (See Comments)    unknown   Fosamax [Alendronate]    Metronidazole Nausea And Vomiting   Phenergan [Promethazine Hcl] Other (See Comments)    Other Reaction: CNS Disorder Cough/nausea/sweating    Succinylcholine Other (See Comments)    Muscle soreness all over body post operatively ("felt like I was hit by [facility] truck")    HOME  MEDICATIONS: Outpatient Medications Prior to Visit  Medication Sig Dispense Refill   Ascorbic Acid (VITAMIN C PO) Take 1 tablet by mouth daily.     aspirin EC 81 MG tablet Take 81 mg by mouth 4 (four) times a week. Sundays, Tuesdays, Thursdays, Saturdays.     BIOTIN PO Take 6,000 mcg by mouth daily.     Black Pepper-Turmeric (TURMERIC COMPLEX/BLACK PEPPER PO) Take 1 capsule by mouth daily.     citalopram (CELEXA) 40 MG tablet Take 40 mg by mouth daily.     docusate sodium (COLACE) 100 MG capsule Take 100 mg by mouth daily.     gabapentin (NEURONTIN) 300 MG capsule Take 300 mg by mouth 2 (two) times daily.     losartan-hydrochlorothiazide (HYZAAR) 100-25 MG tablet Take 1 tablet by mouth every evening.      Magnesium 250 MG TABS Take 250 mg by mouth daily.     Melatonin 5 MG TABS Take 5 mg by mouth at bedtime.      methocarbamol (ROBAXIN) 500 MG tablet Take 500 mg by mouth daily as needed for muscle spasms.     Multiple Minerals-Vitamins (CALCIUM-MAGNESIUM-ZINC-D3 PO) Take 1 tablet by mouth daily.     Multiple Vitamin (MULTIVITAMIN WITH MINERALS) TABS tablet Take 1 tablet by mouth daily. Senior Multivitamin     Multiple Vitamins-Minerals (PRESERVISION AREDS 2 PO) Take 1 tablet by mouth 2 (two) times daily.     Omega-3 Fatty Acids (FISH OIL) 1200 MG CAPS Take 1,200 mg by mouth  2 (two) times daily.     OVER THE COUNTER MEDICATION Take 1 capsule by mouth daily. Saffron     pantoprazole (PROTONIX) 40 MG tablet Take 40 mg by mouth daily.     Polyethyl Glycol-Propyl Glycol 0.4-0.3 % SOLN Place 1 drop into both eyes 2 (two) times daily as needed (dry eyes).     Sodium Chloride-Xylitol (XLEAR SINUS CARE SPRAY NA) Place 2 sprays into the nose at bedtime.     No facility-administered medications prior to visit.    PAST MEDICAL HISTORY: Past Medical History:  Diagnosis Date   Anxiety    Arthritis    Basal cell carcinoma    Cervical arthritis    PT STATES CERVICAL SPINE BADLY DEGENERATED--SHE DOES  NOT HAVE NECK PAIN -BUT TOLD HER NECK IS FRAGILE   Complication of anesthesia    myalgia    Diverticulosis    DVT (deep venous thrombosis) (HCC)    Left leg   GERD (gastroesophageal reflux disease)    H/O hiatal hernia    repairsed   HTN (hypertension)    Neuropathy of foot, left    OSA on CPAP    Osteopenia    Osteoporosis    Reflux    Sciatic nerve disease    Sciatic pain    RIGHT LEG / BACK PAIN- PT ON OXYCONTIN 3 TIMES A DAY--STATES MUST [place] TO AVOID WITHDRAWAL   Sleep apnea 04/12/2012   cpap   Squamous carcinoma     PAST SURGICAL HISTORY: Past Surgical History:  Procedure Laterality Date   ABDOMINAL HYSTERECTOMY     BREAST SURGERY     Fibroma, right   EYE SURGERY     "wrinkle in left eye retina"   HERNIA REPAIR     HIATAL HERNIA REPAIR  04/17/2012   Procedure: LAPAROSCOPIC REPAIR OF HIATAL HERNIA;  Surgeon: Pedro Earls, MD;  Location: [facility];  Service: General;  Laterality: N/A;  Large Hiatus Hernia   INTRAOPERATIVE ARTERIOGRAM     NASAL SEPTOPLASTY W/ TURBINOPLASTY Bilateral 12/03/2018   Procedure: NASAL SEPTOPLASTY WITH BILATERAL TURBINATE REDUCTION;  Surgeon: Rozetta Nunnery, MD;  Location: [facility];  Service: ENT;  Laterality: Bilateral;   neuro stimulator     THORACIC AREA-BATTERY IN RIGHT HIP PT TURNS OFF AND ON WITH MAGNET-PT STATES SHE IS PAIN FREE WHEN LYING DOWN -BUT WHEN SITTING OR STANDING SHE EXPERIENCES TINGLING AND PAIN DOWN RIGHT LEG-SCIATIC PAIN   SPINAL FUSION     8 level  LUMBAR/THORACIC   TOTAL HIP ARTHROPLASTY     left   TOTAL HIP ARTHROPLASTY Right 03/29/2017   Procedure: RIGHT TOTAL HIP ARTHROPLASTY ANTERIOR APPROACH;  Surgeon: Gaynelle Arabian, MD;  Location: [facility];  Service: Orthopedics;  Laterality: Right;   TOTAL VAGINAL HYSTERECTOMY  1993   TUBAL LIGATION     verteboplasty      FAMILY HISTORY: Family History  Problem Relation Age of Onset   Hypertension Mother    COPD Mother    Heart disease Father     Hypertension Father    Skin cancer Father    Dementia Brother    Breast cancer Maternal Grandmother    Colon cancer Neg Hx     SOCIAL HISTORY: Social History   Socioeconomic History   Marital status: Married    Spouse name: Not on file   Number of children: 2   Years of education: Not on file   Highest education level: Not on file  Occupational History  Occupation: retired    Fish farm manager: RETIRED  Tobacco Use   Smoking status: Former    Types: Cigarettes   Smokeless tobacco: Never   Tobacco comments:    QUIT SMOKING IN HER EARLY 40'S  AND WAS NEVER A HEAVY SMOKER  Vaping Use   Vaping Use: Never used  Substance and Sexual Activity   Alcohol use: Yes    Alcohol/week: 1.0 standard drink of alcohol    Types: 1 Standard drinks or equivalent per week    Comment: rarely   Drug use: No   Sexual activity: Never    Birth control/protection: Surgical    Comment: DECLINED INSURCNACE QUESTIONS  Other Topics Concern   Not on file  Social History Narrative   Not on file   Social Determinants of Health   Financial Resource Strain: Not on file  Food Insecurity: Not on file  Transportation Needs: Not on file  Physical Activity: Not on file  Stress: Not on file  Social Connections: Not on file  Intimate Partner Violence: Not on file      PHYSICAL EXAM  There were no vitals filed for this visit.  There is no height or weight on file to calculate BMI.  Generalized: Well developed, in no acute distress  Chest: Lungs clear to auscultation bilaterally  Neurological examination  Mentation: Alert oriented to time, place, history taking. Follows all commands speech and language fluent Cranial nerve II-XII: Extraocular movements were full, visual field were full on confrontational test Head turning and shoulder shrug  were normal and symmetric. Motor: The motor testing reveals 5 over 5 strength of all 4 extremities. Good symmetric motor tone is noted throughout.  Sensory: Sensory  testing is intact to soft touch on all 4 extremities. No evidence of extinction is noted.  Gait and station: Gait is normal.    DIAGNOSTIC DATA (LABS, IMAGING, TESTING) - I reviewed patient records, labs, notes, testing and imaging myself where available.  Lab Results  Component Value Date   WBC 5.5 09/14/2021   HGB 12.1 09/14/2021   HCT 37.4 09/14/2021   MCV 96.9 09/14/2021   PLT 259 09/14/2021      Component Value Date/Time   NA 138 09/14/2021 1010   K 4.0 09/14/2021 1010   CL 105 09/14/2021 1010   CO2 26 09/14/2021 1010   GLUCOSE 99 09/14/2021 1010   BUN 17 09/14/2021 1010   CREATININE 0.97 09/14/2021 1010   CALCIUM 9.6 09/14/2021 1010   PROT 6.9 03/22/2017 1440   ALBUMIN 4.1 03/22/2017 1440   AST 26 03/22/2017 1440   ALT 16 03/22/2017 1440   ALKPHOS 73 03/22/2017 1440   BILITOT 0.6 03/22/2017 1440   GFRNONAA 57 (L) 09/14/2021 1010   GFRAA 53 (L) 11/29/2018 0900     ASSESSMENT AND PLAN 89 y.o. year old female  has a past medical history of Anxiety, Arthritis, Basal cell carcinoma, Cervical arthritis, Complication of anesthesia, Diverticulosis, DVT (deep venous thrombosis) ([place]), GERD (gastroesophageal reflux disease), H/O hiatal hernia, HTN (hypertension), Neuropathy of foot, left, OSA on CPAP, Osteopenia, Osteoporosis, Reflux, Sciatic nerve disease, Sciatic pain, Sleep apnea (04/12/2012), and Squamous carcinoma. here with:  OSA on CPAP  - CPAP compliance excellent - Good treatment of AHI  - Encourage patient to use CPAP nightly and > 4 hours each night - F/U in 1 year or sooner if needed     Ward Givens, MSN, NP-C 01/04/2023, 4:04 PM [facility] [address] [place],  [address] [phone]

## 2023-01-05 ENCOUNTER — Ambulatory Visit (INDEPENDENT_AMBULATORY_CARE_PROVIDER_SITE_OTHER): Payer: Medicare Other | Admitting: Adult Health

## 2023-01-05 ENCOUNTER — Encounter: Payer: Self-pay | Admitting: Adult Health

## 2023-01-05 VITALS — BP 139/70 | HR 69 | Ht 62.0 in | Wt 158.0 lb

## 2023-01-05 DIAGNOSIS — G4733 Obstructive sleep apnea (adult) (pediatric): Secondary | ICD-10-CM

## 2023-01-05 NOTE — Patient Instructions (Signed)
Continue using CPAP nightly and greater than 4 hours each night °If your symptoms worsen or you develop new symptoms please let us know.  ° °

## 2023-01-06 DIAGNOSIS — Z4542 Encounter for adjustment and management of neuropacemaker (brain) (peripheral nerve) (spinal cord): Secondary | ICD-10-CM | POA: Diagnosis not present

## 2023-01-06 DIAGNOSIS — T85193A Other mechanical complication of implanted electronic neurostimulator, generator, initial encounter: Secondary | ICD-10-CM | POA: Diagnosis not present

## 2023-01-06 DIAGNOSIS — M545 Low back pain, unspecified: Secondary | ICD-10-CM | POA: Diagnosis not present

## 2023-02-20 DIAGNOSIS — R82998 Other abnormal findings in urine: Secondary | ICD-10-CM | POA: Diagnosis not present

## 2023-02-20 DIAGNOSIS — N1832 Chronic kidney disease, stage 3b: Secondary | ICD-10-CM | POA: Diagnosis not present

## 2023-02-20 DIAGNOSIS — Z0000 Encounter for general adult medical examination without abnormal findings: Secondary | ICD-10-CM | POA: Diagnosis not present

## 2023-02-20 DIAGNOSIS — M810 Age-related osteoporosis without current pathological fracture: Secondary | ICD-10-CM | POA: Diagnosis not present

## 2023-02-20 DIAGNOSIS — K219 Gastro-esophageal reflux disease without esophagitis: Secondary | ICD-10-CM | POA: Diagnosis not present

## 2023-02-20 DIAGNOSIS — R7989 Other specified abnormal findings of blood chemistry: Secondary | ICD-10-CM | POA: Diagnosis not present

## 2023-02-20 DIAGNOSIS — I10 Essential (primary) hypertension: Secondary | ICD-10-CM | POA: Diagnosis not present

## 2023-02-23 DIAGNOSIS — L600 Ingrowing nail: Secondary | ICD-10-CM | POA: Diagnosis not present

## 2023-02-27 DIAGNOSIS — L600 Ingrowing nail: Secondary | ICD-10-CM | POA: Diagnosis not present

## 2023-03-01 DIAGNOSIS — Z0000 Encounter for general adult medical examination without abnormal findings: Secondary | ICD-10-CM | POA: Diagnosis not present

## 2023-03-01 DIAGNOSIS — M419 Scoliosis, unspecified: Secondary | ICD-10-CM | POA: Diagnosis not present

## 2023-03-01 DIAGNOSIS — E663 Overweight: Secondary | ICD-10-CM | POA: Diagnosis not present

## 2023-03-01 DIAGNOSIS — I129 Hypertensive chronic kidney disease with stage 1 through stage 4 chronic kidney disease, or unspecified chronic kidney disease: Secondary | ICD-10-CM | POA: Diagnosis not present

## 2023-03-01 DIAGNOSIS — Z1339 Encounter for screening examination for other mental health and behavioral disorders: Secondary | ICD-10-CM | POA: Diagnosis not present

## 2023-03-01 DIAGNOSIS — Z1331 Encounter for screening for depression: Secondary | ICD-10-CM | POA: Diagnosis not present

## 2023-03-01 DIAGNOSIS — N1832 Chronic kidney disease, stage 3b: Secondary | ICD-10-CM | POA: Diagnosis not present

## 2023-03-02 DIAGNOSIS — Z9889 Other specified postprocedural states: Secondary | ICD-10-CM | POA: Diagnosis not present

## 2023-03-23 DIAGNOSIS — H353121 Nonexudative age-related macular degeneration, left eye, early dry stage: Secondary | ICD-10-CM | POA: Diagnosis not present

## 2023-03-23 DIAGNOSIS — G4733 Obstructive sleep apnea (adult) (pediatric): Secondary | ICD-10-CM | POA: Diagnosis not present

## 2023-03-23 DIAGNOSIS — H353212 Exudative age-related macular degeneration, right eye, with inactive choroidal neovascularization: Secondary | ICD-10-CM | POA: Diagnosis not present

## 2023-03-23 DIAGNOSIS — H401121 Primary open-angle glaucoma, left eye, mild stage: Secondary | ICD-10-CM | POA: Diagnosis not present

## 2023-03-23 DIAGNOSIS — H35372 Puckering of macula, left eye: Secondary | ICD-10-CM | POA: Diagnosis not present

## 2023-05-15 DIAGNOSIS — L570 Actinic keratosis: Secondary | ICD-10-CM | POA: Diagnosis not present

## 2023-05-15 DIAGNOSIS — L821 Other seborrheic keratosis: Secondary | ICD-10-CM | POA: Diagnosis not present

## 2023-05-15 DIAGNOSIS — L820 Inflamed seborrheic keratosis: Secondary | ICD-10-CM | POA: Diagnosis not present

## 2023-05-15 DIAGNOSIS — D0439 Carcinoma in situ of skin of other parts of face: Secondary | ICD-10-CM | POA: Diagnosis not present

## 2023-05-15 DIAGNOSIS — Z85828 Personal history of other malignant neoplasm of skin: Secondary | ICD-10-CM | POA: Diagnosis not present

## 2023-05-22 ENCOUNTER — Telehealth: Payer: Self-pay | Admitting: Interventional Cardiology

## 2023-05-22 NOTE — Telephone Encounter (Signed)
Spoke with pt. Pt's Stacy Parker said she was in Afib. This is not the first time this has happened and pt has been seen for this before. Pt states she has felt fine and went to water aerobatics this morning. Pts BP while on phone with me was 127/55 with a 81 pulse. Pt denies any lightheadedness, dizziness, chest pain or pressure. Pt asked for Dr. Eldridge Dace to review and would like to know what happens if this continues. I told her I would send this over to Dr. Eldridge Dace and his nurse to review and someone would reach out to her.

## 2023-05-22 NOTE — Telephone Encounter (Signed)
Pt states that her BP cuff told her that she was in possible Afib. Pt states that he BP was 127/72 and she feels fine, not having any symptoms of any kind. She would like a callback regarding this matter. Please advise

## 2023-05-25 NOTE — Telephone Encounter (Signed)
Can we see the strips of what Lourena Simmonds is calling AFib?

## 2023-05-30 DIAGNOSIS — D0439 Carcinoma in situ of skin of other parts of face: Secondary | ICD-10-CM | POA: Diagnosis not present

## 2023-05-30 DIAGNOSIS — Z85828 Personal history of other malignant neoplasm of skin: Secondary | ICD-10-CM | POA: Diagnosis not present

## 2023-06-05 NOTE — Telephone Encounter (Signed)
I spoke with patient regarding sending in strips from [place].  Patient reports she is unable to send in readings through my chart or print out readings.  I offered office visit so she could bring in [place] device.  Patient feels she is back in regular rhythm and does not want to come in for an office visit at this time.

## 2023-06-05 NOTE — Telephone Encounter (Signed)
Left message to call office

## 2023-06-19 DIAGNOSIS — S0990XA Unspecified injury of head, initial encounter: Secondary | ICD-10-CM | POA: Diagnosis not present

## 2023-06-19 DIAGNOSIS — S0101XA Laceration without foreign body of scalp, initial encounter: Secondary | ICD-10-CM | POA: Diagnosis not present

## 2023-06-26 DIAGNOSIS — S0101XD Laceration without foreign body of scalp, subsequent encounter: Secondary | ICD-10-CM | POA: Diagnosis not present

## 2023-07-06 DIAGNOSIS — H35361 Drusen (degenerative) of macula, right eye: Secondary | ICD-10-CM | POA: Diagnosis not present

## 2023-07-06 DIAGNOSIS — H35372 Puckering of macula, left eye: Secondary | ICD-10-CM | POA: Diagnosis not present

## 2023-07-06 DIAGNOSIS — H353121 Nonexudative age-related macular degeneration, left eye, early dry stage: Secondary | ICD-10-CM | POA: Diagnosis not present

## 2023-07-06 DIAGNOSIS — H353212 Exudative age-related macular degeneration, right eye, with inactive choroidal neovascularization: Secondary | ICD-10-CM | POA: Diagnosis not present

## 2023-07-06 DIAGNOSIS — H3554 Dystrophies primarily involving the retinal pigment epithelium: Secondary | ICD-10-CM | POA: Diagnosis not present

## 2023-07-17 DIAGNOSIS — B351 Tinea unguium: Secondary | ICD-10-CM | POA: Diagnosis not present

## 2023-07-17 DIAGNOSIS — L600 Ingrowing nail: Secondary | ICD-10-CM | POA: Diagnosis not present

## 2023-07-18 DIAGNOSIS — Z85828 Personal history of other malignant neoplasm of skin: Secondary | ICD-10-CM | POA: Diagnosis not present

## 2023-07-18 DIAGNOSIS — L245 Irritant contact dermatitis due to other chemical products: Secondary | ICD-10-CM | POA: Diagnosis not present

## 2023-07-24 DIAGNOSIS — Z9889 Other specified postprocedural states: Secondary | ICD-10-CM | POA: Diagnosis not present

## 2023-08-09 DIAGNOSIS — H353122 Nonexudative age-related macular degeneration, left eye, intermediate dry stage: Secondary | ICD-10-CM | POA: Diagnosis not present

## 2023-08-09 DIAGNOSIS — H353211 Exudative age-related macular degeneration, right eye, with active choroidal neovascularization: Secondary | ICD-10-CM | POA: Diagnosis not present

## 2023-08-09 DIAGNOSIS — H353121 Nonexudative age-related macular degeneration, left eye, early dry stage: Secondary | ICD-10-CM | POA: Diagnosis not present

## 2023-08-09 DIAGNOSIS — H35342 Macular cyst, hole, or pseudohole, left eye: Secondary | ICD-10-CM | POA: Diagnosis not present

## 2023-08-09 DIAGNOSIS — H353212 Exudative age-related macular degeneration, right eye, with inactive choroidal neovascularization: Secondary | ICD-10-CM | POA: Diagnosis not present

## 2023-08-09 DIAGNOSIS — H401121 Primary open-angle glaucoma, left eye, mild stage: Secondary | ICD-10-CM | POA: Diagnosis not present

## 2023-08-09 DIAGNOSIS — Z961 Presence of intraocular lens: Secondary | ICD-10-CM | POA: Diagnosis not present

## 2023-08-09 DIAGNOSIS — H35372 Puckering of macula, left eye: Secondary | ICD-10-CM | POA: Diagnosis not present

## 2023-08-15 DIAGNOSIS — Z23 Encounter for immunization: Secondary | ICD-10-CM | POA: Diagnosis not present

## 2023-09-06 DIAGNOSIS — H04123 Dry eye syndrome of bilateral lacrimal glands: Secondary | ICD-10-CM | POA: Diagnosis not present

## 2023-09-06 DIAGNOSIS — H353211 Exudative age-related macular degeneration, right eye, with active choroidal neovascularization: Secondary | ICD-10-CM | POA: Diagnosis not present

## 2023-09-06 DIAGNOSIS — H35342 Macular cyst, hole, or pseudohole, left eye: Secondary | ICD-10-CM | POA: Diagnosis not present

## 2023-09-06 DIAGNOSIS — H35372 Puckering of macula, left eye: Secondary | ICD-10-CM | POA: Diagnosis not present

## 2023-09-06 DIAGNOSIS — H35363 Drusen (degenerative) of macula, bilateral: Secondary | ICD-10-CM | POA: Diagnosis not present

## 2023-09-06 DIAGNOSIS — H353122 Nonexudative age-related macular degeneration, left eye, intermediate dry stage: Secondary | ICD-10-CM | POA: Diagnosis not present

## 2023-09-11 DIAGNOSIS — L820 Inflamed seborrheic keratosis: Secondary | ICD-10-CM | POA: Diagnosis not present

## 2023-09-11 DIAGNOSIS — L570 Actinic keratosis: Secondary | ICD-10-CM | POA: Diagnosis not present

## 2023-09-11 DIAGNOSIS — Z85828 Personal history of other malignant neoplasm of skin: Secondary | ICD-10-CM | POA: Diagnosis not present

## 2023-09-13 DIAGNOSIS — L609 Nail disorder, unspecified: Secondary | ICD-10-CM | POA: Diagnosis not present

## 2023-09-26 DIAGNOSIS — L600 Ingrowing nail: Secondary | ICD-10-CM | POA: Diagnosis not present

## 2023-10-03 DIAGNOSIS — Z9889 Other specified postprocedural states: Secondary | ICD-10-CM | POA: Diagnosis not present

## 2023-10-03 DIAGNOSIS — L600 Ingrowing nail: Secondary | ICD-10-CM | POA: Diagnosis not present

## 2023-10-09 DIAGNOSIS — H35033 Hypertensive retinopathy, bilateral: Secondary | ICD-10-CM | POA: Diagnosis not present

## 2023-10-09 DIAGNOSIS — H401132 Primary open-angle glaucoma, bilateral, moderate stage: Secondary | ICD-10-CM | POA: Diagnosis not present

## 2023-10-09 DIAGNOSIS — H353122 Nonexudative age-related macular degeneration, left eye, intermediate dry stage: Secondary | ICD-10-CM | POA: Diagnosis not present

## 2023-10-09 DIAGNOSIS — H353211 Exudative age-related macular degeneration, right eye, with active choroidal neovascularization: Secondary | ICD-10-CM | POA: Diagnosis not present

## 2023-10-09 DIAGNOSIS — H43811 Vitreous degeneration, right eye: Secondary | ICD-10-CM | POA: Diagnosis not present

## 2023-10-23 DIAGNOSIS — H353211 Exudative age-related macular degeneration, right eye, with active choroidal neovascularization: Secondary | ICD-10-CM | POA: Diagnosis not present

## 2023-11-20 DIAGNOSIS — H353211 Exudative age-related macular degeneration, right eye, with active choroidal neovascularization: Secondary | ICD-10-CM | POA: Diagnosis not present

## 2023-11-24 ENCOUNTER — Ambulatory Visit: Payer: Medicare Other | Admitting: Cardiology

## 2023-11-24 DIAGNOSIS — Z471 Aftercare following joint replacement surgery: Secondary | ICD-10-CM | POA: Diagnosis not present

## 2023-11-24 DIAGNOSIS — Z96642 Presence of left artificial hip joint: Secondary | ICD-10-CM | POA: Diagnosis not present

## 2023-11-29 ENCOUNTER — Telehealth: Payer: Self-pay | Admitting: Adult Health

## 2023-11-29 ENCOUNTER — Encounter: Payer: Self-pay | Admitting: Adult Health

## 2023-11-29 NOTE — Telephone Encounter (Signed)
Pt returning call to r/s. Rescheduled

## 2023-11-29 NOTE — Telephone Encounter (Signed)
LVM and sent letter in mail informing pt of need to reschedule 01/24/24 appt - NP schedule change

## 2023-12-04 DIAGNOSIS — L820 Inflamed seborrheic keratosis: Secondary | ICD-10-CM | POA: Diagnosis not present

## 2023-12-04 DIAGNOSIS — L570 Actinic keratosis: Secondary | ICD-10-CM | POA: Diagnosis not present

## 2023-12-04 DIAGNOSIS — Z85828 Personal history of other malignant neoplasm of skin: Secondary | ICD-10-CM | POA: Diagnosis not present

## 2023-12-04 DIAGNOSIS — L821 Other seborrheic keratosis: Secondary | ICD-10-CM | POA: Diagnosis not present

## 2023-12-13 DIAGNOSIS — Z1231 Encounter for screening mammogram for malignant neoplasm of breast: Secondary | ICD-10-CM | POA: Diagnosis not present

## 2023-12-19 DIAGNOSIS — H353211 Exudative age-related macular degeneration, right eye, with active choroidal neovascularization: Secondary | ICD-10-CM | POA: Diagnosis not present

## 2024-01-04 ENCOUNTER — Ambulatory Visit: Payer: Medicare Other | Admitting: Adult Health

## 2024-01-10 ENCOUNTER — Encounter: Payer: Self-pay | Admitting: Adult Health

## 2024-01-10 ENCOUNTER — Telehealth: Payer: Self-pay | Admitting: Adult Health

## 2024-01-10 NOTE — Telephone Encounter (Signed)
 LVM and sent letter in mail informing pt of appt time change on 03/28/24 due to NP being out

## 2024-01-16 DIAGNOSIS — N390 Urinary tract infection, site not specified: Secondary | ICD-10-CM | POA: Diagnosis not present

## 2024-01-19 DIAGNOSIS — H401132 Primary open-angle glaucoma, bilateral, moderate stage: Secondary | ICD-10-CM | POA: Diagnosis not present

## 2024-01-19 DIAGNOSIS — H43811 Vitreous degeneration, right eye: Secondary | ICD-10-CM | POA: Diagnosis not present

## 2024-01-19 DIAGNOSIS — H353211 Exudative age-related macular degeneration, right eye, with active choroidal neovascularization: Secondary | ICD-10-CM | POA: Diagnosis not present

## 2024-01-19 DIAGNOSIS — H35033 Hypertensive retinopathy, bilateral: Secondary | ICD-10-CM | POA: Diagnosis not present

## 2024-01-19 DIAGNOSIS — H353122 Nonexudative age-related macular degeneration, left eye, intermediate dry stage: Secondary | ICD-10-CM | POA: Diagnosis not present

## 2024-01-23 ENCOUNTER — Telehealth: Payer: Self-pay | Admitting: Cardiology

## 2024-01-23 NOTE — Telephone Encounter (Signed)
 New Message:       Patient would like to switch from Dr Antoine Poche to Dr Anne Fu. She says her husband is a patient of Dr Anne Fu. Is this alright with you?

## 2024-01-24 ENCOUNTER — Ambulatory Visit: Payer: Medicare Other | Admitting: Adult Health

## 2024-02-14 DIAGNOSIS — N8111 Cystocele, midline: Secondary | ICD-10-CM | POA: Diagnosis not present

## 2024-02-14 DIAGNOSIS — N3000 Acute cystitis without hematuria: Secondary | ICD-10-CM | POA: Diagnosis not present

## 2024-02-21 DIAGNOSIS — Z1389 Encounter for screening for other disorder: Secondary | ICD-10-CM | POA: Diagnosis not present

## 2024-02-28 DIAGNOSIS — N1832 Chronic kidney disease, stage 3b: Secondary | ICD-10-CM | POA: Diagnosis not present

## 2024-02-28 DIAGNOSIS — I129 Hypertensive chronic kidney disease with stage 1 through stage 4 chronic kidney disease, or unspecified chronic kidney disease: Secondary | ICD-10-CM | POA: Diagnosis not present

## 2024-02-28 DIAGNOSIS — M810 Age-related osteoporosis without current pathological fracture: Secondary | ICD-10-CM | POA: Diagnosis not present

## 2024-02-28 DIAGNOSIS — R82998 Other abnormal findings in urine: Secondary | ICD-10-CM | POA: Diagnosis not present

## 2024-02-28 DIAGNOSIS — K219 Gastro-esophageal reflux disease without esophagitis: Secondary | ICD-10-CM | POA: Diagnosis not present

## 2024-03-04 DIAGNOSIS — H353211 Exudative age-related macular degeneration, right eye, with active choroidal neovascularization: Secondary | ICD-10-CM | POA: Diagnosis not present

## 2024-03-06 DIAGNOSIS — Z1331 Encounter for screening for depression: Secondary | ICD-10-CM | POA: Diagnosis not present

## 2024-03-06 DIAGNOSIS — N1832 Chronic kidney disease, stage 3b: Secondary | ICD-10-CM | POA: Diagnosis not present

## 2024-03-06 DIAGNOSIS — G4733 Obstructive sleep apnea (adult) (pediatric): Secondary | ICD-10-CM | POA: Diagnosis not present

## 2024-03-06 DIAGNOSIS — I129 Hypertensive chronic kidney disease with stage 1 through stage 4 chronic kidney disease, or unspecified chronic kidney disease: Secondary | ICD-10-CM | POA: Diagnosis not present

## 2024-03-06 DIAGNOSIS — Z1339 Encounter for screening examination for other mental health and behavioral disorders: Secondary | ICD-10-CM | POA: Diagnosis not present

## 2024-03-06 DIAGNOSIS — K219 Gastro-esophageal reflux disease without esophagitis: Secondary | ICD-10-CM | POA: Diagnosis not present

## 2024-03-06 DIAGNOSIS — Z0000 Encounter for general adult medical examination without abnormal findings: Secondary | ICD-10-CM | POA: Diagnosis not present

## 2024-03-28 ENCOUNTER — Ambulatory Visit (INDEPENDENT_AMBULATORY_CARE_PROVIDER_SITE_OTHER): Admitting: Adult Health

## 2024-03-28 ENCOUNTER — Encounter: Payer: Self-pay | Admitting: Adult Health

## 2024-03-28 ENCOUNTER — Ambulatory Visit: Payer: Medicare Other | Admitting: Adult Health

## 2024-03-28 VITALS — BP 106/65 | HR 66 | Ht 63.0 in | Wt 159.4 lb

## 2024-03-28 DIAGNOSIS — G4733 Obstructive sleep apnea (adult) (pediatric): Secondary | ICD-10-CM | POA: Diagnosis not present

## 2024-03-28 NOTE — Patient Instructions (Signed)
 Continue using CPAP nightly and greater than 4 hours each night If your symptoms worsen or you develop new symptoms please let us know.

## 2024-03-28 NOTE — Progress Notes (Signed)
 PATIENT: Stacy Parker DOB: [birth year]  REASON FOR VISIT: follow up HISTORY FROM: patient PRIMARY NEUROLOGIST: Dr. Albertina Hugger  Chief Complaint  Patient presents with   Follow-up    Pt in 18 alone pt here for cpap f/u Pt wants to do VV for next appointment      HISTORY OF PRESENT ILLNESS:  Today 03/28/24:  Stacy Parker is a 89 y.o. female with a history of OSA on CPAP. Returns today for follow-up.  Reports that CPAP is working well.  Continues to find it beneficial.  Denies any new issues.  Download is below     Stacy Parker is a 89 y.o. female with a history of OSA on cpap. Returns today for follow-up.  Reports that the CPAP is working well.  She denies any new issues.  States that she continues to find it beneficial.  Her download is below     12/30/21: Stacy Parker is an 89 year old female with a history of obstructive sleep apnea on CPAP.  She reports that the CPAP is working well for her.  Her download is below      REVIEW OF SYSTEMS: Out of a complete 14 system review of symptoms, the patient complains only of the following symptoms, and all other reviewed systems are negative.   ESS 4  ALLERGIES: Allergies  Allergen Reactions   Ace Inhibitors Other (See Comments)    Unsure of what reaction was   Alendronate Sodium    Ambien  [Zolpidem Tartrate] Diarrhea    Pt states she doesn't recall this being an allergy at all    Boniva [Ibandronate] Other (See Comments)    Flu like symptoms STATES ALL THE OSTEOPOROSIS DRUGS HAVE CAUSED FLU LIKE SYMPTOMS    Ceftin  [Cefuroxime Axetil] Diarrhea and Swelling   Cymbalta [Duloxetine Hcl] Nausea Only   Dicloxacillin Diarrhea    Has patient had a PCN reaction causing immediate rash, facial/tongue/throat swelling, SOB or lightheadedness with hypotension:unsure Has patient had a PCN reaction causing severe rash involving mucus membranes or skin necrosis:unsure Has patient had a PCN reaction that required  hospitalization:NO Has patient had a PCN reaction occurring within the last 10 years:unsure If all of the above answers are "NO", then may proceed with Cephalosporin use.    Doxycycline Nausea Only and Other (See Comments)    unknown   Fosamax [Alendronate]    Metronidazole Nausea And Vomiting   Phenergan  [Promethazine  Hcl] Other (See Comments)    Other Reaction: CNS Disorder Cough/nausea/sweating    Succinylcholine  Other (See Comments)    Muscle soreness all over body post operatively ("felt like I was hit by [employer] truck")    HOME MEDICATIONS: Outpatient Medications Prior to Visit  Medication Sig Dispense Refill   Ascorbic Acid (VITAMIN C PO) Take 1 tablet by mouth daily.     aspirin  EC 81 MG tablet Take 81 mg by mouth 4 (four) times a week. Sundays, Tuesdays, Thursdays, Saturdays.     BIOTIN PO Take 6,000 mcg by mouth daily.     Black Pepper-Turmeric (TURMERIC COMPLEX/BLACK PEPPER PO) Take 1 capsule by mouth daily.     citalopram  (CELEXA ) 40 MG tablet Take 40 mg by mouth daily.     docusate sodium  (COLACE) 100 MG capsule Take 100 mg by mouth daily.     dorzolamide-timolol (COSOPT) 2-0.5 % ophthalmic solution Place 1 drop into the left eye 2 (two) times daily.     gabapentin  (NEURONTIN ) 300 MG capsule Take 300  mg by mouth 2 (two) times daily.     losartan -hydrochlorothiazide  (HYZAAR) 100-25 MG tablet Take 1 tablet by mouth every evening.      Magnesium  250 MG TABS Take 250 mg by mouth daily.     Melatonin 5 MG TABS Take 5 mg by mouth at bedtime.      methocarbamol  (ROBAXIN ) 500 MG tablet Take 500 mg by mouth daily as needed for muscle spasms.     Multiple Minerals-Vitamins (CALCIUM-MAGNESIUM -ZINC -D3 PO) Take 1 tablet by mouth daily.     Multiple Vitamin (MULTIVITAMIN WITH MINERALS) TABS tablet Take 1 tablet by mouth daily. Senior Multivitamin     Multiple Vitamins-Minerals (PRESERVISION AREDS 2 PO) Take 1 tablet by mouth 2 (two) times daily.     Omega-3 Fatty Acids (FISH OIL) 1200  MG CAPS Take 1,200 mg by mouth 2 (two) times daily.     OVER THE COUNTER MEDICATION Take 1 capsule by mouth daily. Saffron     Polyethyl Glycol-Propyl Glycol 0.4-0.3 % SOLN Place 1 drop into both eyes 2 (two) times daily as needed (dry eyes).     Sodium Chloride -Xylitol (XLEAR SINUS CARE SPRAY NA) Place 2 sprays into the nose at bedtime.     pantoprazole  (PROTONIX ) 40 MG tablet Take 40 mg by mouth daily.     No facility-administered medications prior to visit.    PAST MEDICAL HISTORY: Past Medical History:  Diagnosis Date   Anxiety    Arthritis    Basal cell carcinoma    Cervical arthritis    PT STATES CERVICAL SPINE BADLY DEGENERATED--SHE DOES NOT HAVE NECK PAIN -BUT TOLD HER NECK IS FRAGILE   Complication of anesthesia    myalgia    Diverticulosis    DVT (deep venous thrombosis) (HCC)    Left leg   GERD (gastroesophageal reflux disease)    H/O hiatal hernia    repairsed   HTN (hypertension)    Neuropathy of foot, left    OSA on CPAP    Osteopenia    Osteoporosis    Reflux    Sciatic nerve disease    Sciatic pain    RIGHT LEG / BACK PAIN- PT ON OXYCONTIN  3 TIMES A DAY--STATES MUST CONTINUE WHILE IN HOSPITAL TO AVOID WITHDRAWAL   Sleep apnea 04/12/2012   cpap   Squamous carcinoma     PAST SURGICAL HISTORY: Past Surgical History:  Procedure Laterality Date   ABDOMINAL HYSTERECTOMY     BREAST SURGERY     Fibroma, right   EYE SURGERY     "wrinkle in left eye retina"   HERNIA REPAIR     HIATAL HERNIA REPAIR  04/17/2012   Procedure: LAPAROSCOPIC REPAIR OF HIATAL HERNIA;  Surgeon: Azucena Bollard, MD;  Location: [facility];  Service: General;  Laterality: N/A;  Large Hiatus Hernia   INTRAOPERATIVE ARTERIOGRAM     NASAL SEPTOPLASTY W/ TURBINOPLASTY Bilateral 12/03/2018   Procedure: NASAL SEPTOPLASTY WITH BILATERAL TURBINATE REDUCTION;  Surgeon: Prescott Brodie, MD;  Location: [facility];  Service: ENT;  Laterality: Bilateral;   neuro stimulator     THORACIC AREA-BATTERY IN  RIGHT HIP PT TURNS OFF AND ON WITH MAGNET-PT STATES SHE IS PAIN FREE WHEN LYING DOWN -BUT WHEN SITTING OR STANDING SHE EXPERIENCES TINGLING AND PAIN DOWN RIGHT LEG-SCIATIC PAIN   SPINAL FUSION     8 level  LUMBAR/THORACIC   TOTAL HIP ARTHROPLASTY     left   TOTAL HIP ARTHROPLASTY Right 03/29/2017   Procedure: RIGHT TOTAL HIP ARTHROPLASTY  ANTERIOR APPROACH;  Surgeon: Liliane Rei, MD;  Location: [facility];  Service: Orthopedics;  Laterality: Right;   TOTAL VAGINAL HYSTERECTOMY  1993   TUBAL LIGATION     verteboplasty      FAMILY HISTORY: Family History  Problem Relation Age of Onset   Hypertension Mother    COPD Mother    Heart disease Father    Hypertension Father    Skin cancer Father    Dementia Brother    Breast cancer Maternal Grandmother    Colon cancer Neg Hx     SOCIAL HISTORY: Social History   Socioeconomic History   Marital status: Married    Spouse name: Not on file   Number of children: 2   Years of education: Not on file   Highest education level: Not on file  Occupational History   Occupation: retired    Associate Professor: RETIRED  Tobacco Use   Smoking status: Former    Types: Cigarettes   Smokeless tobacco: Never   Tobacco comments:    QUIT SMOKING IN HER EARLY 40'S  AND WAS NEVER A HEAVY SMOKER  Vaping Use   Vaping status: Never Used  Substance and Sexual Activity   Alcohol  use: Yes    Alcohol /week: 1.0 standard drink of alcohol     Types: 1 Glasses of wine per week   Drug use: No   Sexual activity: Never    Birth control/protection: Surgical    Comment: DECLINED INSURCNACE QUESTIONS  Other Topics Concern   Not on file  Social History Narrative   Right handed   Caffeine: 1 cup/day   Lives with husband at [facility] independent living      Social Drivers of Health   Financial Resource Strain: Not on file  Food Insecurity: Not on file  Transportation Needs: Not on file  Physical Activity: Not on file  Stress: Not on file  Social Connections:  Not on file  Intimate Partner Violence: Not on file      PHYSICAL EXAM  Vitals:   03/28/24 0937  BP: 106/65  Pulse: 66  Weight: 159 lb 6.4 oz (72.3 kg)  Height: 5\' 3"  (1.6 m)    Body mass index is 28.24 kg/m.  Generalized: Well developed, in no acute distress  Chest: Lungs clear to auscultation bilaterally  Neurological examination  Mentation: Alert oriented to time, place, history taking. Follows all commands speech and language fluent Cranial nerve II-XII: Facial symmetry noted  DIAGNOSTIC DATA (LABS, IMAGING, TESTING) - I reviewed patient records, labs, notes, testing and imaging myself where available.  Lab Results  Component Value Date   WBC 5.5 09/14/2021   HGB 12.1 09/14/2021   HCT 37.4 09/14/2021   MCV 96.9 09/14/2021   PLT 259 09/14/2021      Component Value Date/Time   NA 138 09/14/2021 1010   K 4.0 09/14/2021 1010   CL 105 09/14/2021 1010   CO2 26 09/14/2021 1010   GLUCOSE 99 09/14/2021 1010   BUN 17 09/14/2021 1010   CREATININE 0.97 09/14/2021 1010   CALCIUM 9.6 09/14/2021 1010   PROT 6.9 03/22/2017 1440   ALBUMIN 4.1 03/22/2017 1440   AST 26 03/22/2017 1440   ALT 16 03/22/2017 1440   ALKPHOS 73 03/22/2017 1440   BILITOT 0.6 03/22/2017 1440   GFRNONAA 57 (L) 09/14/2021 1010   GFRAA 53 (L) 11/29/2018 0900     ASSESSMENT AND PLAN 89 y.o. year old female  has a past medical history of Anxiety, Arthritis, Basal cell carcinoma,  Cervical arthritis, Complication of anesthesia, Diverticulosis, DVT (deep venous thrombosis) (HCC), GERD (gastroesophageal reflux disease), H/O hiatal hernia, HTN (hypertension), Neuropathy of foot, left, OSA on CPAP, Osteopenia, Osteoporosis, Reflux, Sciatic nerve disease, Sciatic pain, Sleep apnea (04/12/2012), and Squamous carcinoma. here with:  OSA on CPAP  - CPAP compliance excellent - Good treatment of AHI  - Encourage patient to use CPAP nightly and > 4 hours each night - F/U in 1 year or sooner if  needed     Clem Currier, MSN, NP-C 03/28/2024, 9:42 AM [facility] [address] [place], [place] [address] [phone]

## 2024-04-09 DIAGNOSIS — L308 Other specified dermatitis: Secondary | ICD-10-CM | POA: Diagnosis not present

## 2024-04-09 DIAGNOSIS — L821 Other seborrheic keratosis: Secondary | ICD-10-CM | POA: Diagnosis not present

## 2024-04-09 DIAGNOSIS — Z85828 Personal history of other malignant neoplasm of skin: Secondary | ICD-10-CM | POA: Diagnosis not present

## 2024-04-15 DIAGNOSIS — H353211 Exudative age-related macular degeneration, right eye, with active choroidal neovascularization: Secondary | ICD-10-CM | POA: Diagnosis not present

## 2024-04-18 DIAGNOSIS — H401121 Primary open-angle glaucoma, left eye, mild stage: Secondary | ICD-10-CM | POA: Diagnosis not present

## 2024-04-18 DIAGNOSIS — H353122 Nonexudative age-related macular degeneration, left eye, intermediate dry stage: Secondary | ICD-10-CM | POA: Diagnosis not present

## 2024-04-18 DIAGNOSIS — H5213 Myopia, bilateral: Secondary | ICD-10-CM | POA: Diagnosis not present

## 2024-04-18 DIAGNOSIS — H524 Presbyopia: Secondary | ICD-10-CM | POA: Diagnosis not present

## 2024-04-18 DIAGNOSIS — Z961 Presence of intraocular lens: Secondary | ICD-10-CM | POA: Diagnosis not present

## 2024-04-18 DIAGNOSIS — H52201 Unspecified astigmatism, right eye: Secondary | ICD-10-CM | POA: Diagnosis not present

## 2024-04-18 DIAGNOSIS — H353211 Exudative age-related macular degeneration, right eye, with active choroidal neovascularization: Secondary | ICD-10-CM | POA: Diagnosis not present

## 2024-04-22 ENCOUNTER — Ambulatory Visit: Attending: Cardiology | Admitting: Cardiology

## 2024-04-22 ENCOUNTER — Encounter: Payer: Self-pay | Admitting: Cardiology

## 2024-04-22 VITALS — BP 130/78 | HR 68 | Ht 63.0 in | Wt 158.0 lb

## 2024-04-22 DIAGNOSIS — R002 Palpitations: Secondary | ICD-10-CM | POA: Insufficient documentation

## 2024-04-22 DIAGNOSIS — I447 Left bundle-branch block, unspecified: Secondary | ICD-10-CM | POA: Insufficient documentation

## 2024-04-22 NOTE — Progress Notes (Signed)
 Cardiology Office Note:  .   Date:  04/22/2024  ID:  Stacy Parker, DOB 01-Jul-1934, MRN [id number] PCP: Suan Elm, MD  [facility] Providers Cardiologist:  Dorothye Gathers, MD    History of Present Illness: .   Stacy Parker is a 89 y.o. female Discussed the use of AI scribe software for clinical note transcription with the patient, who gave verbal consent to proceed.  History of Present Illness Stacy Parker is an 89 year old female who presents for a baseline cardiovascular evaluation. We see her husband, Dorla Gartner. She is worried that he does not move as much as he could.   She has a long-standing history of left bundle branch block, confirmed by a current EKG showing sinus rhythm with left bundle branch block. No fainting episodes, chest pain, or unusual shortness of breath. Previously, an EKG showed sinus rhythm with LBBB and PAC. Current with NSR LBBB  She remains active, participating in aerobic swimming despite having an eight-level fusion in her hips. Her current medications include aspirin  81 mg four times a week, losartan , hydrochlorothiazide  100/25 mg daily, and fish oil. Her triglycerides are slightly elevated at 188 mg/dL, but her LDL is 40 mg/dL and her W0J is 8.1%.  She humorously refers to her 'shrinking brain' and 'shrinking vessels in the brain,' attributing these to age following a scan after a minor accident.  She resides at [facility], a diverse community with staged care, and has lived in [place] for fifty years after originally being from [place] .     Studies Reviewed: Aaron Aas   EKG Interpretation Date/Time:  Monday April 22 2024 11:18:56 EDT Ventricular Rate:  68 PR Interval:  166 QRS Duration:  146 QT Interval:  444 QTC Calculation: 472 R Axis:   -17  Text Interpretation: Normal sinus rhythm Left bundle branch block When compared with ECG of 14-Sep-2021 10:18, Premature atrial complexes are no longer Present Confirmed by  Dorothye Gathers ([id number]) on 04/22/2024 11:20:50 AM    Results LABS Triglycerides: 188 mg/dL LDL: 40 mg/dL W2N: 5.6% Hb: 21.3 g/dL  RADIOLOGY Brain MRI: Age-appropriate cerebral atrophy and vascular changes  DIAGNOSTIC EKG: Sinus rhythm with left bundle branch block (04/22/2024) Risk Assessment/Calculations:            Physical Exam:   VS:  BP 130/78   Pulse 68   Ht 5' 3 (1.6 m)   Wt 158 lb (71.7 kg)   SpO2 94%   BMI 27.99 kg/m    Wt Readings from Last 3 Encounters:  04/22/24 158 lb (71.7 kg)  03/28/24 159 lb 6.4 oz (72.3 kg)  01/05/23 158 lb (71.7 kg)    GEN: Well nourished, well developed in no acute distress NECK: No JVD; No carotid bruits CARDIAC: RRR, no murmurs, no rubs, no gallops RESPIRATORY:  Clear to auscultation without rales, wheezing or rhonchi  ABDOMEN: Soft, non-tender, non-distended EXTREMITIES:  No edema; No deformity   ASSESSMENT AND PLAN: .    Assessment and Plan Assessment & Plan Left bundle branch block Chronic left bundle branch block on EKG with sinus rhythm. No symptoms such as syncope, angina, or dyspnea. She remains active with aerobic swimming and reports no significant cardiovascular symptoms. - Maintain current activity level with aerobic swimming. - No immediate follow-up unless new symptoms develop.  Premature atrial contraction - No evidence of this on EKG today.  Prior EKGs have shown PACs.  No prior evidence of atrial fibrillation.  Hypertension Hypertension is well-controlled with losartan   and hydrochlorothiazide . Blood pressure management is stable. - Continue current antihypertensive regimen. - Regular blood pressure monitoring.  Hyperlipidemia Hyperlipidemia managed with fish oil. Triglycerides are slightly elevated at 188 mg/dL, but LDL is well-controlled at 40 mg/dL. - Continue fish oil supplementation.         Dispo: PRN follow up  Signed, Dorothye Gathers, MD

## 2024-04-22 NOTE — Patient Instructions (Addendum)
 Medication Instructions:  The current medical regimen is effective;  continue present plan and medications.  *If you need a refill on your cardiac medications before your next appointment, please call your pharmacy*  Follow-Up: At [facility], you and your health needs are our priority.  As part of our continuing mission to provide you with exceptional heart care, our providers are all part of one team.  This team includes your primary Cardiologist (physician) and Advanced Practice Providers or APPs (Physician Assistants and Nurse Practitioners) who all work together to provide you with the care you need, when you need it.  Your next appointment:   Follow up as needed with Dr Renna Cary   We recommend signing up for the patient portal called MyChart.  Sign up information is provided on this After Visit Summary.  MyChart is used to connect with patients for Virtual Visits (Telemedicine).  Patients are able to view lab/test results, encounter notes, upcoming appointments, etc.  Non-urgent messages can be sent to your provider as well.   To learn more about what you can do with MyChart, go to [email or web address].         Thank you for choosing [facility]!!

## 2024-05-07 DIAGNOSIS — M545 Low back pain, unspecified: Secondary | ICD-10-CM | POA: Diagnosis not present

## 2024-05-07 DIAGNOSIS — T85848A Pain due to other internal prosthetic devices, implants and grafts, initial encounter: Secondary | ICD-10-CM | POA: Diagnosis not present

## 2024-05-31 DIAGNOSIS — H353124 Nonexudative age-related macular degeneration, left eye, advanced atrophic with subfoveal involvement: Secondary | ICD-10-CM | POA: Diagnosis not present

## 2024-05-31 DIAGNOSIS — H35372 Puckering of macula, left eye: Secondary | ICD-10-CM | POA: Diagnosis not present

## 2024-05-31 DIAGNOSIS — H35342 Macular cyst, hole, or pseudohole, left eye: Secondary | ICD-10-CM | POA: Diagnosis not present

## 2024-05-31 DIAGNOSIS — H5213 Myopia, bilateral: Secondary | ICD-10-CM | POA: Diagnosis not present

## 2024-05-31 DIAGNOSIS — H353211 Exudative age-related macular degeneration, right eye, with active choroidal neovascularization: Secondary | ICD-10-CM | POA: Diagnosis not present

## 2024-05-31 DIAGNOSIS — Z961 Presence of intraocular lens: Secondary | ICD-10-CM | POA: Diagnosis not present

## 2024-06-03 DIAGNOSIS — M1812 Unilateral primary osteoarthritis of first carpometacarpal joint, left hand: Secondary | ICD-10-CM | POA: Diagnosis not present

## 2024-06-03 DIAGNOSIS — M67912 Unspecified disorder of synovium and tendon, left shoulder: Secondary | ICD-10-CM | POA: Diagnosis not present

## 2024-06-03 DIAGNOSIS — M19011 Primary osteoarthritis, right shoulder: Secondary | ICD-10-CM | POA: Diagnosis not present

## 2024-06-03 DIAGNOSIS — G5623 Lesion of ulnar nerve, bilateral upper limbs: Secondary | ICD-10-CM | POA: Diagnosis not present

## 2024-06-06 DIAGNOSIS — N390 Urinary tract infection, site not specified: Secondary | ICD-10-CM | POA: Diagnosis not present

## 2024-06-10 DIAGNOSIS — H35033 Hypertensive retinopathy, bilateral: Secondary | ICD-10-CM | POA: Diagnosis not present

## 2024-06-10 DIAGNOSIS — H353211 Exudative age-related macular degeneration, right eye, with active choroidal neovascularization: Secondary | ICD-10-CM | POA: Diagnosis not present

## 2024-06-10 DIAGNOSIS — H353122 Nonexudative age-related macular degeneration, left eye, intermediate dry stage: Secondary | ICD-10-CM | POA: Diagnosis not present

## 2024-06-10 DIAGNOSIS — H43811 Vitreous degeneration, right eye: Secondary | ICD-10-CM | POA: Diagnosis not present

## 2024-06-10 DIAGNOSIS — H401132 Primary open-angle glaucoma, bilateral, moderate stage: Secondary | ICD-10-CM | POA: Diagnosis not present

## 2024-06-11 DIAGNOSIS — N390 Urinary tract infection, site not specified: Secondary | ICD-10-CM | POA: Diagnosis not present

## 2024-06-18 DIAGNOSIS — Z5309 Procedure and treatment not carried out because of other contraindication: Secondary | ICD-10-CM | POA: Diagnosis not present

## 2024-06-18 DIAGNOSIS — T85199A Other mechanical complication of other implanted electronic stimulator of nervous system, initial encounter: Secondary | ICD-10-CM | POA: Diagnosis not present

## 2024-06-18 DIAGNOSIS — M549 Dorsalgia, unspecified: Secondary | ICD-10-CM | POA: Diagnosis not present

## 2024-06-18 DIAGNOSIS — I97791 Other intraoperative cardiac functional disturbances during other surgery: Secondary | ICD-10-CM | POA: Diagnosis not present

## 2024-06-18 DIAGNOSIS — Z4542 Encounter for adjustment and management of neuropacemaker (brain) (peripheral nerve) (spinal cord): Secondary | ICD-10-CM | POA: Diagnosis not present

## 2024-06-26 DIAGNOSIS — R053 Chronic cough: Secondary | ICD-10-CM | POA: Diagnosis not present

## 2024-07-03 DIAGNOSIS — L570 Actinic keratosis: Secondary | ICD-10-CM | POA: Diagnosis not present

## 2024-07-03 DIAGNOSIS — Z85828 Personal history of other malignant neoplasm of skin: Secondary | ICD-10-CM | POA: Diagnosis not present

## 2024-07-03 DIAGNOSIS — L304 Erythema intertrigo: Secondary | ICD-10-CM | POA: Diagnosis not present

## 2024-07-03 DIAGNOSIS — L821 Other seborrheic keratosis: Secondary | ICD-10-CM | POA: Diagnosis not present

## 2024-07-11 ENCOUNTER — Ambulatory Visit (HOSPITAL_BASED_OUTPATIENT_CLINIC_OR_DEPARTMENT_OTHER): Admitting: Cardiology

## 2024-07-11 VITALS — BP 112/62 | HR 68 | Ht 62.0 in | Wt 158.2 lb

## 2024-07-11 DIAGNOSIS — R9431 Abnormal electrocardiogram [ECG] [EKG]: Secondary | ICD-10-CM | POA: Diagnosis not present

## 2024-07-11 DIAGNOSIS — I447 Left bundle-branch block, unspecified: Secondary | ICD-10-CM | POA: Diagnosis not present

## 2024-07-11 DIAGNOSIS — R002 Palpitations: Secondary | ICD-10-CM

## 2024-07-11 MED ORDER — METOPROLOL SUCCINATE ER 25 MG PO TB24: 25.0000 mg | ORAL_TABLET | Freq: Every day | ORAL | 3 refills | Status: AC

## 2024-07-11 NOTE — Progress Notes (Signed)
 Cardiology Office Note:  .   Date:  07/11/2024  ID:  Stacy Parker, DOB October 01, 1934, MRN [id number] PCP: Shepard Ade, MD  [facility] Providers Cardiologist:  Oneil Parchment, MD     History of Present Illness: .   Stacy Parker is a 89 y.o. female Discussed the use of AI scribe software for clinical note transcription with the patient, who gave verbal consent to proceed.  History of Present Illness Stacy Parker is an 89 year old female who presents for follow-up after a canceled spinal stimulator battery replacement due to tachyarrhythmia.  She was scheduled for a spinal stimulator battery replacement on either the 10th or 11th of the previous month, but the procedure was not completed after she experienced an episode where her heart 'went nuts' during the preparation. She describes her heart as 'going nuts' during the procedure and recalls being told that she did not have atrial fibrillation.  An EKG was performed and showed sinus rhythm left bundle branch block with frequent PACs or premature atrial contractions.  No evidence of atrial fibrillation.  Heart rate was 80s to 90s.  Her procedure was canceled.  She has a long-standing history of left bundle branch block. Her hypertension is well controlled with losartan  and hydrochlorothiazide . She manages hyperlipidemia with fish oil, noting slightly elevated triglycerides at 188 mg/dL, but her LDL is controlled at 40 mg/dL.  She mentions difficulty with recharging the stimulator battery, describing it as a significant challenge due to its location. She wants to have the battery replacement completed as soon as possible.  She resides at [facility] and has been in [place] for fifty years, originally from [place] .    Studies Reviewed: SABRA   EKG Interpretation Date/Time:  Thursday July 11 2024 10:32:15 EDT Ventricular Rate:  91 PR Interval:  168 QRS Duration:  146 QT Interval:  446 QTC  Calculation: 548 R Axis:   -17  Text Interpretation: Sinus rhythm with Premature atrial complexes Left bundle branch block When compared with ECG of 22-Apr-2024 11:18, Premature atrial complexes are now Present Confirmed by Parchment Oneil ([id number]) on 07/11/2024 10:33:38 AM    Results LABS Triglycerides: 188 mg/dL LDL: 40 mg/dL  DIAGNOSTIC EKG: Tachyarrhythmia, no PVCs, premature atrial contractions, not AFib Risk Assessment/Calculations:            Physical Exam:   VS:  BP 112/62 (BP Location: Left Arm, Patient Position: Sitting, Cuff Size: Normal)   Pulse 68   Ht 5' 2 (1.575 m)   Wt 158 lb 4 oz (71.8 kg)   SpO2 97%   BMI 28.94 kg/m    Wt Readings from Last 3 Encounters:  07/11/24 158 lb 4 oz (71.8 kg)  04/22/24 158 lb (71.7 kg)  03/28/24 159 lb 6.4 oz (72.3 kg)    GEN: Well nourished, well developed in no acute distress NECK: No JVD; No carotid bruits CARDIAC: Ectopy RRR, no murmurs, no rubs, no gallops RESPIRATORY:  Clear to auscultation without rales, wheezing or rhonchi  ABDOMEN: Soft, non-tender, non-distended EXTREMITIES:  No edema; No deformity   ASSESSMENT AND PLAN: .    Assessment and Plan Assessment & Plan Premature atrial contractions Premature atrial contractions identified, not atrial fibrillation. Recent tachyarrhythmia during attempted spinal stimulator battery replacement. EKG shows sinus rhythm with PACs. Condition is not considered dangerous. Comfortable with proceeding with stimulator battery replacement after further evaluation. - Prescribe metoprolol  25 mg daily to manage PACs. - Order echocardiogram to assess cardiac function.  Left  bundle branch block Long-standing left bundle branch block with no new changes or concerns.  Hypertension Hypertension is well controlled with current medication regimen of losartan  and hydrochlorothiazide .         Signed, Oneil Parchment, MD

## 2024-07-11 NOTE — Patient Instructions (Addendum)
 Medication Instructions:  Please start Metoprolol  Succinate 25 mg once daily. Continue all other medications as listed.  *If you need a refill on your cardiac medications before your next appointment, please call your pharmacy*  Testing/Procedures: Your physician has requested that you have an echocardiogram. Echocardiography is a painless test that uses sound waves to create images of your heart. It provides your doctor with information about the size and shape of your heart and how well your heart's chambers and valves are working. This procedure takes approximately one hour. There are no restrictions for this procedure. Please do NOT wear cologne, perfume, aftershave, or lotions (deodorant is allowed). Please arrive 15 minutes prior to your appointment time.  Please note: We ask at that you not bring children with you during ultrasound (echo/ vascular) testing. Due to room size and safety concerns, children are not allowed in the ultrasound rooms during exams. Our front office staff cannot provide observation of children in our lobby area while testing is being conducted. An adult accompanying a patient to their appointment will only be allowed in the ultrasound room at the discretion of the ultrasound technician under special circumstances. We apologize for any inconvenience.  Follow-Up: At [facility], you and your health needs are our priority.  As part of our continuing mission to provide you with exceptional heart care, our providers are all part of one team.  This team includes your primary Cardiologist (physician) and Advanced Practice Providers or APPs (Physician Assistants and Nurse Practitioners) who all work together to provide you with the care you need, when you need it.  Your next appointment:   6 month(s)  Provider:   One of our Advanced Practice Providers (APPs): Morse Clause, PA-C  Lamarr Satterfield, NP Miriam Shams, NP  Olivia Pavy, PA-C Josefa Beauvais,  NP  Leontine Salen, PA-C Orren Fabry, PA-C  [place], PA-C Ernest Dick, NP  Damien Braver, NP Jon Hails, PA-C  Waddell Donath, PA-C    Dayna Dunn, PA-C  Scott Weaver, PA-C Lum Louis, NP Katlyn West, NP Callie Goodrich, PA-C  Evan Williams, PA-C Sheng Haley, PA-C  Xika Zhao, NP Kathleen Johnson, PA-C       We recommend signing up for the patient portal called MyChart.  Sign up information is provided on this After Visit Summary.  MyChart is used to connect with patients for Virtual Visits (Telemedicine).  Patients are able to view lab/test results, encounter notes, upcoming appointments, etc.  Non-urgent messages can be sent to your provider as well.   To learn more about what you can do with MyChart, go to [email or web address].

## 2024-07-16 ENCOUNTER — Telehealth: Payer: Self-pay

## 2024-07-16 NOTE — Telephone Encounter (Signed)
   Pre-operative Risk Assessment    Patient Name: Stacy Parker  DOB: 30-Dec-1933 MRN: [id number]   Date of last office visit: 07/11/24 ONEIL PARCHMENT, MD Date of next office visit: NONE   Request for Surgical Clearance    Procedure:  REPLACING SPINAL CORD STIMULATOR BATTERY  Date of Surgery:  Clearance TBD                                Surgeon:  DR DONACIANO SPRANG Surgeon's Group or Practice Name:  [facility] Phone number:  [phone] Fax number:  [phone]   ATTN: KATHERYN STONE   Type of Clearance Requested:   - Medical  - Pharmacy:  Hold Aspirin      Type of Anesthesia:  Not Indicated   Additional requests/questions:    SignedLucie DELENA Ku   07/16/2024, 4:45 PM

## 2024-07-17 NOTE — Telephone Encounter (Signed)
   Patient Name: Stacy Parker  DOB: 1934-04-26 MRN: [id number]  Primary Cardiologist: Oneil Parchment, MD  Chart reviewed as part of pre-operative protocol coverage. Given past medical history and time since last visit, based on ACC/AHA guidelines, Stacy Parker is at acceptable risk for the planned procedure without further cardiovascular testing.   Per Dr.Skains 07/11/2024  EKG shows sinus rhythm with PACs. Condition is not considered dangerous. Comfortable with proceeding with stimulator battery replacement after further evaluation.   Per office protocol, if patient is without any new symptoms or concerns at the time of their virtual visit, she may hold ASA for 7 days prior to procedure. Please resume ASA as soon as possible postprocedure, at the discretion of the surgeon.    The patient was advised that if she develops new symptoms prior to surgery to contact our office to arrange for a follow-up visit, and she verbalized understanding.  I will route this recommendation to the requesting party via Epic fax function and remove from pre-op pool.  Please call with questions.  Lamarr Satterfield, NP 07/17/2024, 8:45 AM

## 2024-08-01 ENCOUNTER — Ambulatory Visit (INDEPENDENT_AMBULATORY_CARE_PROVIDER_SITE_OTHER)

## 2024-08-01 DIAGNOSIS — R9431 Abnormal electrocardiogram [ECG] [EKG]: Secondary | ICD-10-CM | POA: Diagnosis not present

## 2024-08-01 DIAGNOSIS — R002 Palpitations: Secondary | ICD-10-CM

## 2024-08-01 LAB — ECHOCARDIOGRAM COMPLETE
Area-P 1/2: 2.87 cm2
S' Lateral: 3.28 cm

## 2024-08-02 ENCOUNTER — Ambulatory Visit (INDEPENDENT_AMBULATORY_CARE_PROVIDER_SITE_OTHER): Payer: Self-pay | Admitting: Cardiology

## 2024-08-02 DIAGNOSIS — M545 Low back pain, unspecified: Secondary | ICD-10-CM

## 2024-08-02 DIAGNOSIS — G8929 Other chronic pain: Secondary | ICD-10-CM

## 2024-08-05 NOTE — Telephone Encounter (Signed)
 Lori from [facility] requesting a c/b or a surgical update.

## 2024-08-06 DIAGNOSIS — H353211 Exudative age-related macular degeneration, right eye, with active choroidal neovascularization: Secondary | ICD-10-CM | POA: Diagnosis not present

## 2024-08-06 NOTE — Telephone Encounter (Signed)
 2nd request received.  Will route to the preop pool for the preop app to review

## 2024-08-07 NOTE — Telephone Encounter (Signed)
  Thank you for your message seeking medical advice.* My assessment and recommendation are as follows:  Yes, I do believe it is safe for you to proceed with back stimulator generator change out.  In regards to Stacy Parker, I would encourage him to move/exercise to the best of his ability.  Movement is key to maintain health.  Of course he has had some balance issues and needs to be careful.  There is no reason from a cardiac perspective for him not to try to exert himself to the best of his abilities.  Thank you for reaching out.  Sincerely,  Oneil Parchment, MD    *This exchange required the expertise of a doctor, nurse practitioner, physician assistant, optometrist or certified nurse midwife and qualifies as a Medical Advice Message, please visit [email or web address] for more details. Stacy Parker will bill your insurance on your behalf; copays and deductibles may apply. Questions? Reply to this message.    Please see the MyChart message reply(ies) for my assessment and plan.    Yes, I do believe it is safe for you to proceed with back stimulator generator change out.  In regards to Stacy Parker, I would encourage him to move/exercise to the best of his ability.  Movement is key to maintain health.  Of course he has had some balance issues and needs to be careful.  There is no reason from a cardiac perspective for him not to try to exert himself to the best of his abilities.  Thank you for reaching out.  This patient gave consent for this Medical Advice Message and is aware that it may result in a bill to [employer], as well as the possibility of receiving a bill for a co-payment or deductible. They are an established patient, but are not seeking medical advice exclusively about a problem treated during an in person or video visit in the last seven days. I did not recommend an in person or video visit within seven days of my reply.    I spent a total of 7 minutes cumulative time within 7  days through [employer].  Oneil Parchment, MD

## 2024-08-16 DIAGNOSIS — Z23 Encounter for immunization: Secondary | ICD-10-CM | POA: Diagnosis not present

## 2024-08-20 ENCOUNTER — Ambulatory Visit (HOSPITAL_COMMUNITY): Payer: Self-pay | Admitting: Physician Assistant

## 2024-08-23 DIAGNOSIS — W19XXXA Unspecified fall, initial encounter: Secondary | ICD-10-CM | POA: Diagnosis not present

## 2024-08-23 DIAGNOSIS — S0083XA Contusion of other part of head, initial encounter: Secondary | ICD-10-CM | POA: Diagnosis not present

## 2024-08-23 DIAGNOSIS — S42292A Other displaced fracture of upper end of left humerus, initial encounter for closed fracture: Secondary | ICD-10-CM | POA: Diagnosis not present

## 2024-08-23 DIAGNOSIS — S42202A Unspecified fracture of upper end of left humerus, initial encounter for closed fracture: Secondary | ICD-10-CM | POA: Diagnosis not present

## 2024-08-23 DIAGNOSIS — Y929 Unspecified place or not applicable: Secondary | ICD-10-CM | POA: Diagnosis not present

## 2024-08-23 DIAGNOSIS — S4992XA Unspecified injury of left shoulder and upper arm, initial encounter: Secondary | ICD-10-CM | POA: Diagnosis not present

## 2024-08-26 DIAGNOSIS — M79602 Pain in left arm: Secondary | ICD-10-CM | POA: Diagnosis not present

## 2024-08-29 ENCOUNTER — Other Ambulatory Visit (HOSPITAL_COMMUNITY)

## 2024-09-02 ENCOUNTER — Ambulatory Visit (HOSPITAL_COMMUNITY): Admit: 2024-09-02 | Admitting: Orthopedic Surgery

## 2024-09-02 SURGERY — SPINAL CORD STIMULATOR BATTERY EXCHANGE
Anesthesia: General

## 2024-09-17 DIAGNOSIS — S42202A Unspecified fracture of upper end of left humerus, initial encounter for closed fracture: Secondary | ICD-10-CM | POA: Diagnosis not present

## 2024-09-19 DIAGNOSIS — N1832 Chronic kidney disease, stage 3b: Secondary | ICD-10-CM | POA: Diagnosis not present

## 2024-09-19 DIAGNOSIS — R2681 Unsteadiness on feet: Secondary | ICD-10-CM | POA: Diagnosis not present

## 2024-09-19 DIAGNOSIS — I129 Hypertensive chronic kidney disease with stage 1 through stage 4 chronic kidney disease, or unspecified chronic kidney disease: Secondary | ICD-10-CM | POA: Diagnosis not present

## 2024-09-19 DIAGNOSIS — E785 Hyperlipidemia, unspecified: Secondary | ICD-10-CM | POA: Diagnosis not present

## 2024-09-19 DIAGNOSIS — Z7389 Other problems related to life management difficulty: Secondary | ICD-10-CM | POA: Diagnosis not present

## 2024-09-19 DIAGNOSIS — R41841 Cognitive communication deficit: Secondary | ICD-10-CM | POA: Diagnosis not present

## 2024-09-19 DIAGNOSIS — R262 Difficulty in walking, not elsewhere classified: Secondary | ICD-10-CM | POA: Diagnosis not present

## 2024-09-19 DIAGNOSIS — R1312 Dysphagia, oropharyngeal phase: Secondary | ICD-10-CM | POA: Diagnosis not present

## 2024-09-19 DIAGNOSIS — Z9181 History of falling: Secondary | ICD-10-CM | POA: Diagnosis not present

## 2024-09-19 DIAGNOSIS — M810 Age-related osteoporosis without current pathological fracture: Secondary | ICD-10-CM | POA: Diagnosis not present

## 2024-09-20 DIAGNOSIS — R234 Changes in skin texture: Secondary | ICD-10-CM | POA: Diagnosis not present

## 2024-09-20 DIAGNOSIS — R2681 Unsteadiness on feet: Secondary | ICD-10-CM | POA: Diagnosis not present

## 2024-09-20 DIAGNOSIS — G4730 Sleep apnea, unspecified: Secondary | ICD-10-CM | POA: Diagnosis not present

## 2024-09-20 DIAGNOSIS — R41841 Cognitive communication deficit: Secondary | ICD-10-CM | POA: Diagnosis not present

## 2024-09-20 DIAGNOSIS — R262 Difficulty in walking, not elsewhere classified: Secondary | ICD-10-CM | POA: Diagnosis not present

## 2024-09-20 DIAGNOSIS — Z9181 History of falling: Secondary | ICD-10-CM | POA: Diagnosis not present

## 2024-09-20 DIAGNOSIS — S42302D Unspecified fracture of shaft of humerus, left arm, subsequent encounter for fracture with routine healing: Secondary | ICD-10-CM | POA: Diagnosis not present

## 2024-09-20 DIAGNOSIS — R1312 Dysphagia, oropharyngeal phase: Secondary | ICD-10-CM | POA: Diagnosis not present

## 2024-09-20 DIAGNOSIS — Z7389 Other problems related to life management difficulty: Secondary | ICD-10-CM | POA: Diagnosis not present

## 2024-09-23 DIAGNOSIS — R41841 Cognitive communication deficit: Secondary | ICD-10-CM | POA: Diagnosis not present

## 2024-09-23 DIAGNOSIS — R262 Difficulty in walking, not elsewhere classified: Secondary | ICD-10-CM | POA: Diagnosis not present

## 2024-09-23 DIAGNOSIS — R2681 Unsteadiness on feet: Secondary | ICD-10-CM | POA: Diagnosis not present

## 2024-09-23 DIAGNOSIS — Z7389 Other problems related to life management difficulty: Secondary | ICD-10-CM | POA: Diagnosis not present

## 2024-09-23 DIAGNOSIS — Z9181 History of falling: Secondary | ICD-10-CM | POA: Diagnosis not present

## 2024-09-23 DIAGNOSIS — R1312 Dysphagia, oropharyngeal phase: Secondary | ICD-10-CM | POA: Diagnosis not present

## 2024-09-24 DIAGNOSIS — R262 Difficulty in walking, not elsewhere classified: Secondary | ICD-10-CM | POA: Diagnosis not present

## 2024-09-24 DIAGNOSIS — Z9181 History of falling: Secondary | ICD-10-CM | POA: Diagnosis not present

## 2024-09-24 DIAGNOSIS — R2681 Unsteadiness on feet: Secondary | ICD-10-CM | POA: Diagnosis not present

## 2024-09-24 DIAGNOSIS — R9431 Abnormal electrocardiogram [ECG] [EKG]: Secondary | ICD-10-CM | POA: Diagnosis not present

## 2024-09-24 DIAGNOSIS — Z7389 Other problems related to life management difficulty: Secondary | ICD-10-CM | POA: Diagnosis not present

## 2024-09-24 DIAGNOSIS — R41841 Cognitive communication deficit: Secondary | ICD-10-CM | POA: Diagnosis not present

## 2024-09-24 DIAGNOSIS — I499 Cardiac arrhythmia, unspecified: Secondary | ICD-10-CM | POA: Diagnosis not present

## 2024-09-24 DIAGNOSIS — R1312 Dysphagia, oropharyngeal phase: Secondary | ICD-10-CM | POA: Diagnosis not present

## 2024-09-25 DIAGNOSIS — Z4889 Encounter for other specified surgical aftercare: Secondary | ICD-10-CM | POA: Diagnosis not present

## 2024-09-25 DIAGNOSIS — Z9181 History of falling: Secondary | ICD-10-CM | POA: Diagnosis not present

## 2024-09-25 DIAGNOSIS — R1312 Dysphagia, oropharyngeal phase: Secondary | ICD-10-CM | POA: Diagnosis not present

## 2024-09-25 DIAGNOSIS — R262 Difficulty in walking, not elsewhere classified: Secondary | ICD-10-CM | POA: Diagnosis not present

## 2024-09-25 DIAGNOSIS — Z7389 Other problems related to life management difficulty: Secondary | ICD-10-CM | POA: Diagnosis not present

## 2024-09-25 DIAGNOSIS — Z978 Presence of other specified devices: Secondary | ICD-10-CM | POA: Diagnosis not present

## 2024-09-25 DIAGNOSIS — R2681 Unsteadiness on feet: Secondary | ICD-10-CM | POA: Diagnosis not present

## 2024-09-25 DIAGNOSIS — R41841 Cognitive communication deficit: Secondary | ICD-10-CM | POA: Diagnosis not present

## 2024-09-26 DIAGNOSIS — Z7389 Other problems related to life management difficulty: Secondary | ICD-10-CM | POA: Diagnosis not present

## 2024-09-26 DIAGNOSIS — R1312 Dysphagia, oropharyngeal phase: Secondary | ICD-10-CM | POA: Diagnosis not present

## 2024-09-26 DIAGNOSIS — Z9181 History of falling: Secondary | ICD-10-CM | POA: Diagnosis not present

## 2024-09-26 DIAGNOSIS — R262 Difficulty in walking, not elsewhere classified: Secondary | ICD-10-CM | POA: Diagnosis not present

## 2024-09-26 DIAGNOSIS — R41841 Cognitive communication deficit: Secondary | ICD-10-CM | POA: Diagnosis not present

## 2024-09-26 DIAGNOSIS — R2681 Unsteadiness on feet: Secondary | ICD-10-CM | POA: Diagnosis not present

## 2024-09-27 DIAGNOSIS — Z7389 Other problems related to life management difficulty: Secondary | ICD-10-CM | POA: Diagnosis not present

## 2024-09-27 DIAGNOSIS — R2681 Unsteadiness on feet: Secondary | ICD-10-CM | POA: Diagnosis not present

## 2024-09-27 DIAGNOSIS — R262 Difficulty in walking, not elsewhere classified: Secondary | ICD-10-CM | POA: Diagnosis not present

## 2024-09-27 DIAGNOSIS — Z9181 History of falling: Secondary | ICD-10-CM | POA: Diagnosis not present

## 2024-09-27 DIAGNOSIS — R1312 Dysphagia, oropharyngeal phase: Secondary | ICD-10-CM | POA: Diagnosis not present

## 2024-09-27 DIAGNOSIS — R41841 Cognitive communication deficit: Secondary | ICD-10-CM | POA: Diagnosis not present

## 2024-09-28 ENCOUNTER — Other Ambulatory Visit: Payer: Self-pay

## 2024-09-28 ENCOUNTER — Encounter (HOSPITAL_COMMUNITY): Payer: Self-pay | Admitting: Radiology

## 2024-09-28 ENCOUNTER — Emergency Department (HOSPITAL_COMMUNITY): Admission: EM | Admit: 2024-09-28 | Discharge: 2024-09-28 | Disposition: A | Discharge status: Home / Self Care

## 2024-09-28 ENCOUNTER — Emergency Department (HOSPITAL_COMMUNITY)

## 2024-09-28 DIAGNOSIS — M4317 Spondylolisthesis, lumbosacral region: Secondary | ICD-10-CM | POA: Diagnosis not present

## 2024-09-28 DIAGNOSIS — L98421 Non-pressure chronic ulcer of back limited to breakdown of skin: Secondary | ICD-10-CM | POA: Insufficient documentation

## 2024-09-28 DIAGNOSIS — M4807 Spinal stenosis, lumbosacral region: Secondary | ICD-10-CM | POA: Diagnosis not present

## 2024-09-28 DIAGNOSIS — I10 Essential (primary) hypertension: Secondary | ICD-10-CM | POA: Diagnosis not present

## 2024-09-28 DIAGNOSIS — Z7982 Long term (current) use of aspirin: Secondary | ICD-10-CM | POA: Diagnosis not present

## 2024-09-28 DIAGNOSIS — Z981 Arthrodesis status: Secondary | ICD-10-CM | POA: Diagnosis not present

## 2024-09-28 DIAGNOSIS — M47817 Spondylosis without myelopathy or radiculopathy, lumbosacral region: Secondary | ICD-10-CM | POA: Diagnosis not present

## 2024-09-28 LAB — I-STAT CG4 LACTIC ACID, ED: Lactic Acid, Venous: 1 mmol/L (ref 0.5–1.9)

## 2024-09-28 LAB — COMPREHENSIVE METABOLIC PANEL WITH GFR
ALT: 20 U/L (ref 0–44)
AST: 29 U/L (ref 15–41)
Albumin: 3.3 g/dL — ABNORMAL LOW (ref 3.5–5.0)
Alkaline Phosphatase: 84 U/L (ref 38–126)
Anion gap: 11 (ref 5–15)
BUN: 20 mg/dL (ref 8–23)
CO2: 25 mmol/L (ref 22–32)
Calcium: 10.1 mg/dL (ref 8.9–10.3)
Chloride: 103 mmol/L (ref 98–111)
Creatinine, Ser: 1 mg/dL (ref 0.44–1.00)
GFR, Estimated: 54 mL/min — ABNORMAL LOW (ref >60)
Glucose, Bld: 94 mg/dL (ref 70–99)
Potassium: 3.2 mmol/L — ABNORMAL LOW (ref 3.5–5.1)
Sodium: 139 mmol/L (ref 135–145)
Total Bilirubin: 0.4 mg/dL (ref 0.0–1.2)
Total Protein: 6.8 g/dL (ref 6.5–8.1)

## 2024-09-28 LAB — CBC WITH DIFFERENTIAL/PLATELET
Abs Immature Granulocytes: 0.03 K/uL (ref 0.00–0.07)
Basophils Absolute: 0.1 K/uL (ref 0.0–0.1)
Basophils Relative: 1 %
Eosinophils Absolute: 0.3 K/uL (ref 0.0–0.5)
Eosinophils Relative: 3 %
HCT: 37.1 % (ref 36.0–46.0)
Hemoglobin: 12.3 g/dL (ref 12.0–15.0)
Immature Granulocytes: 0 %
Lymphocytes Relative: 20 %
Lymphs Abs: 2 K/uL (ref 0.7–4.0)
MCH: 32.1 pg (ref 26.0–34.0)
MCHC: 33.2 g/dL (ref 30.0–36.0)
MCV: 96.9 fL (ref 80.0–100.0)
Monocytes Absolute: 1.5 K/uL — ABNORMAL HIGH (ref 0.1–1.0)
Monocytes Relative: 15 %
Neutro Abs: 6.1 K/uL (ref 1.7–7.7)
Neutrophils Relative %: 61 %
Platelets: 282 K/uL (ref 150–400)
RBC: 3.83 MIL/uL — ABNORMAL LOW (ref 3.87–5.11)
RDW: 13.3 % (ref 11.5–15.5)
WBC: 10 K/uL (ref 4.0–10.5)
nRBC: 0 % (ref 0.0–0.2)

## 2024-09-28 LAB — MAGNESIUM: Magnesium: 1.9 mg/dL (ref 1.7–2.4)

## 2024-09-28 MED ORDER — IOHEXOL 350 MG/ML SOLN
75.0000 mL | Freq: Once | INTRAVENOUS | Status: AC | PRN
  Administered 2024-09-28: 75 mL via INTRAVENOUS

## 2024-09-28 MED ORDER — CEPHALEXIN 500 MG PO CAPS: 500.0000 mg | ORAL_CAPSULE | Freq: Four times a day (QID) | ORAL | 0 refills | Status: AC

## 2024-09-28 MED ORDER — CEPHALEXIN 500 MG PO CAPS: 500.0000 mg | ORAL_CAPSULE | Freq: Four times a day (QID) | ORAL | 0 refills | Status: DC

## 2024-09-28 MED ORDER — POTASSIUM CHLORIDE CRYS ER 20 MEQ PO TBCR
40.0000 meq | EXTENDED_RELEASE_TABLET | Freq: Once | ORAL | Status: AC
  Administered 2024-09-28: 40 meq via ORAL
  Filled 2024-09-28: qty 2

## 2024-09-28 NOTE — Discharge Instructions (Addendum)
 I discussed the plan for discharge with the patient and/or their surrogate at bedside prior to discharge and they were in agreement with the plan and verbalized understanding of the return precautions provided. All questions answered to the best of my ability. Ultimately, the patient was discharged in stable condition with stable vital signs. I am reassured that they are capable of close follow up and good social support at home.   Please follow-up with [facility].  I have provided a prescription for Keflex .  [facility] will be prepared to see you next week.

## 2024-09-28 NOTE — ED Triage Notes (Signed)
 Spinal stimulator on right sacrum, 5 days ago a wound was starting on the bottom of the battery. It is bright red and has warmth around the wound. Daughter states there is a possibly that the battery is leaking.   Daughter reports that she has no sensation in the lumbar/sacral area. She is alert and oriented and ambulatory. She states this is not a bed sore.

## 2024-09-28 NOTE — ED Triage Notes (Signed)
 She endorses that it was scheduled to have a battery change on 09-02-24 but then developed a   08-23-2024 fell and got a shoulder fracture.  09-21-2024 she went into skilled care due to being unable to pull her own pants up due to the fracture. This is when the redness was first noted. The daughter states that this is not a pressure ulcer but is becoming worse. The family is concerned that the battery is leaking under the skin. The red area is 3x3 and has a dark close to black spot in the middle of it.  She has 8 level fusion so has no sensation in lower back.  She is a resident at [facility].

## 2024-09-28 NOTE — ED Triage Notes (Signed)
 This RN was notified that patients heart rate drops into the low 40's and then comes back up .

## 2024-09-28 NOTE — ED Triage Notes (Signed)
 Patient reports that she has a wound on her right hip that is getting worse. Patient refusing to tell me what is going on she said she wants her daughter to talk to us .

## 2024-09-28 NOTE — ED Provider Notes (Signed)
 Home EMERGENCY DEPARTMENT AT [facility] Provider Note   CSN: [id number] Arrival date & time: 09/28/24  1608     Patient presents with: Wound Check   Stacy Parker is a 89 y.o. female.  {Add pertinent medical, surgical, social history, OB history to HPI:32947}  Wound Check  History of GERD, OSA, chronic low back pain status post spinal stimulator approximately 15 years ago, has been nonfunctional for the past 8 years, recent fall with associated left proximal humerus fracture currently in skilled nursing facility and rehab presenting with right-sided low back wound for the past 5 to 6 days.  History per patient and family.  Reports that patient is very active, has been performing water aerobics and walks around very regularly.  She has a prior surgical intervention to the lumbar nerve region, and has had resection of the lumbar  peripheral nerves no sensation to the low back.  Approximately 4-5 days ago began noticing the wound to the low back, that has been progressively worsening.  Seen by nurse practitioner at facility, he stated that they were concerned about the battery leaking from the spinal stimulator.  The location is overlying the spinal stimulator site, and family is very worried about this.  Patient denies any fevers.  She denies any pain to this region.  Has been walking without difficulty.  Denies urinary fecal incontinence.  Denies pain or burning with urination, denies blood in stool.     Prior to Admission medications   Medication Sig Start Date End Date Taking? Authorizing Provider  Ascorbic Acid (VITAMIN C PO) Take 1 tablet by mouth daily.    [provider]  aspirin  EC 81 MG tablet Take 81 mg by mouth 4 (four) times a week. Sundays, Tuesdays, Thursdays, Saturdays.    [provider]  BIOTIN PO Take 6,000 mcg by mouth daily.    [provider]  Black Pepper-Turmeric (TURMERIC COMPLEX/BLACK PEPPER PO) Take 1 capsule by mouth  daily.    [provider]  citalopram  (CELEXA ) 40 MG tablet Take 40 mg by mouth daily.    [provider]  docusate sodium  (COLACE) 100 MG capsule Take 100 mg by mouth daily.    [provider]  dorzolamide-timolol (COSOPT) 2-0.5 % ophthalmic solution Place 1 drop into the left eye 2 (two) times daily. 09/14/22   [provider]  gabapentin  (NEURONTIN ) 300 MG capsule Take 300 mg by mouth 2 (two) times daily.    [provider]  losartan -hydrochlorothiazide  (HYZAAR) 100-25 MG tablet Take 1 tablet by mouth every evening.     [provider]  Magnesium  250 MG TABS Take 250 mg by mouth daily.    [provider]  Melatonin 5 MG TABS Take 5 mg by mouth at bedtime.     [provider]  methocarbamol  (ROBAXIN ) 500 MG tablet Take 500 mg by mouth daily as needed for muscle spasms.    [provider]  metoprolol  succinate (TOPROL  XL) 25 MG 24 hr tablet Take 1 tablet (25 mg total) by mouth daily. 07/11/24   Jeffrie Oneil BROCKS, MD  Multiple Minerals-Vitamins (CALCIUM-MAGNESIUM -ZINC -D3 PO) Take 1 tablet by mouth daily.    [provider]  Multiple Vitamin (MULTIVITAMIN WITH MINERALS) TABS tablet Take 1 tablet by mouth daily. Senior Multivitamin    [provider]  Multiple Vitamins-Minerals (PRESERVISION AREDS 2 PO) Take 1 tablet by mouth 2 (two) times daily.    [provider]  Omega-3 Fatty Acids (FISH OIL)  1200 MG CAPS Take 1,200 mg by mouth 2 (two) times daily.    [provider]  OVER THE COUNTER MEDICATION Take 1 capsule by mouth daily. Saffron    [provider]  pantoprazole  (PROTONIX ) 40 MG tablet Take 40 mg by mouth daily. 05/04/12 07/11/24  Gladis Cough, MD  Polyethyl Glycol-Propyl Glycol 0.4-0.3 % SOLN Place 1 drop into both eyes 2 (two) times daily as needed (dry eyes).    [provider]  Sodium Chloride -Xylitol (XLEAR SINUS CARE SPRAY NA) Place 2 sprays into the nose at  bedtime.    [provider]    Allergies: Ace inhibitors, Alendronate sodium, Ambien  [zolpidem tartrate], Boniva [ibandronate], Ceftin  [cefuroxime axetil], Cymbalta [duloxetine hcl], Dicloxacillin, Doxycycline, Fosamax [alendronate], Metronidazole, Phenergan  [promethazine  hcl], and Succinylcholine     Review of Systems  Updated Vital Signs BP (!) 161/73   Pulse 78   Temp 98.4 F (36.9 C)   Resp 18   Ht 5' 2 (1.575 m)   SpO2 95%   BMI 28.94 kg/m   Physical Exam Vitals and nursing note reviewed.  Constitutional:      General: She is not in acute distress.    Appearance: She is well-developed.  HENT:     Head: Normocephalic and atraumatic.  Eyes:     Conjunctiva/sclera: Conjunctivae normal.  Cardiovascular:     Rate and Rhythm: Normal rate and regular rhythm.     Heart sounds: No murmur heard. Pulmonary:     Effort: Pulmonary effort is normal. No respiratory distress.     Breath sounds: Normal breath sounds.  Abdominal:     Palpations: Abdomen is soft.     Tenderness: There is no abdominal tenderness.  Musculoskeletal:        General: No swelling.     Cervical back: Neck supple.     Comments: See photo.  Region of erythema and swelling appreciated to the right sided paraspinal lumbar region, overlying spinal stimulator.  Appreciable mobile stimulator not adhered to underlying tissue of the lumbar region.  No fluctuance appreciated.  Nontender to palpation.  No erythematous tracking.  Skin:    General: Skin is warm and dry.     Capillary Refill: Capillary refill takes less than 2 seconds.  Neurological:     Mental Status: She is alert.  Psychiatric:        Mood and Affect: Mood normal.     (all labs ordered are listed, but only abnormal results are displayed) Labs Reviewed  CBC WITH DIFFERENTIAL/PLATELET - Abnormal; Notable for the following components:      Result Value   RBC 3.83 (*)    Monocytes Absolute 1.5 (*)    All other components within normal  limits  COMPREHENSIVE METABOLIC PANEL WITH GFR - Abnormal; Notable for the following components:   Potassium 3.2 (*)    Albumin 3.3 (*)    GFR, Estimated 54 (*)    All other components within normal limits  I-STAT CG4 LACTIC ACID, ED  I-STAT CG4 LACTIC ACID, ED    EKG: None  Radiology: No results found.  {Document cardiac monitor, telemetry assessment procedure when appropriate:32947} Procedures   Medications Ordered in the ED - No data to display    {Click here for ABCD2, HEART and other calculators REFRESH Note before signing:1}                              Medical Decision Making Amount  and/or Complexity of Data Reviewed Labs: ordered. Radiology: ordered.  Risk Prescription drug management.   ***  {Document critical care time when appropriate  Document review of labs and clinical decision tools ie CHADS2VASC2, etc  Document your independent review of radiology images and any outside records  Document your discussion with family members, caretakers and with consultants  Document social determinants of health affecting pt's care  Document your decision making why or why not admission, treatments were needed:32947:::1}   Final diagnoses:  None    ED Discharge Orders     None

## 2024-09-30 DIAGNOSIS — R262 Difficulty in walking, not elsewhere classified: Secondary | ICD-10-CM | POA: Diagnosis not present

## 2024-09-30 DIAGNOSIS — M549 Dorsalgia, unspecified: Secondary | ICD-10-CM | POA: Diagnosis not present

## 2024-09-30 DIAGNOSIS — R2681 Unsteadiness on feet: Secondary | ICD-10-CM | POA: Diagnosis not present

## 2024-09-30 DIAGNOSIS — R41841 Cognitive communication deficit: Secondary | ICD-10-CM | POA: Diagnosis not present

## 2024-09-30 DIAGNOSIS — B353 Tinea pedis: Secondary | ICD-10-CM | POA: Diagnosis not present

## 2024-09-30 DIAGNOSIS — L89139 Pressure ulcer of right lower back, unspecified stage: Secondary | ICD-10-CM | POA: Diagnosis not present

## 2024-09-30 DIAGNOSIS — Z7389 Other problems related to life management difficulty: Secondary | ICD-10-CM | POA: Diagnosis not present

## 2024-09-30 DIAGNOSIS — Z9181 History of falling: Secondary | ICD-10-CM | POA: Diagnosis not present

## 2024-09-30 DIAGNOSIS — R1312 Dysphagia, oropharyngeal phase: Secondary | ICD-10-CM | POA: Diagnosis not present

## 2024-09-30 DIAGNOSIS — S42302D Unspecified fracture of shaft of humerus, left arm, subsequent encounter for fracture with routine healing: Secondary | ICD-10-CM | POA: Diagnosis not present

## 2024-10-01 DIAGNOSIS — Z9181 History of falling: Secondary | ICD-10-CM | POA: Diagnosis not present

## 2024-10-01 DIAGNOSIS — R1312 Dysphagia, oropharyngeal phase: Secondary | ICD-10-CM | POA: Diagnosis not present

## 2024-10-01 DIAGNOSIS — R2681 Unsteadiness on feet: Secondary | ICD-10-CM | POA: Diagnosis not present

## 2024-10-01 DIAGNOSIS — R262 Difficulty in walking, not elsewhere classified: Secondary | ICD-10-CM | POA: Diagnosis not present

## 2024-10-01 DIAGNOSIS — Z7389 Other problems related to life management difficulty: Secondary | ICD-10-CM | POA: Diagnosis not present

## 2024-10-01 DIAGNOSIS — R41841 Cognitive communication deficit: Secondary | ICD-10-CM | POA: Diagnosis not present

## 2024-10-02 DIAGNOSIS — R262 Difficulty in walking, not elsewhere classified: Secondary | ICD-10-CM | POA: Diagnosis not present

## 2024-10-02 DIAGNOSIS — R2681 Unsteadiness on feet: Secondary | ICD-10-CM | POA: Diagnosis not present

## 2024-10-02 DIAGNOSIS — Z7389 Other problems related to life management difficulty: Secondary | ICD-10-CM | POA: Diagnosis not present

## 2024-10-02 DIAGNOSIS — Z9181 History of falling: Secondary | ICD-10-CM | POA: Diagnosis not present

## 2024-10-02 DIAGNOSIS — R41841 Cognitive communication deficit: Secondary | ICD-10-CM | POA: Diagnosis not present

## 2024-10-02 DIAGNOSIS — M533 Sacrococcygeal disorders, not elsewhere classified: Secondary | ICD-10-CM | POA: Diagnosis not present

## 2024-10-02 DIAGNOSIS — R1312 Dysphagia, oropharyngeal phase: Secondary | ICD-10-CM | POA: Diagnosis not present

## 2024-10-03 DIAGNOSIS — R262 Difficulty in walking, not elsewhere classified: Secondary | ICD-10-CM | POA: Diagnosis not present

## 2024-10-03 DIAGNOSIS — Z7389 Other problems related to life management difficulty: Secondary | ICD-10-CM | POA: Diagnosis not present

## 2024-10-03 DIAGNOSIS — Z9181 History of falling: Secondary | ICD-10-CM | POA: Diagnosis not present

## 2024-10-03 DIAGNOSIS — R1312 Dysphagia, oropharyngeal phase: Secondary | ICD-10-CM | POA: Diagnosis not present

## 2024-10-03 DIAGNOSIS — R2681 Unsteadiness on feet: Secondary | ICD-10-CM | POA: Diagnosis not present

## 2024-10-03 DIAGNOSIS — R41841 Cognitive communication deficit: Secondary | ICD-10-CM | POA: Diagnosis not present

## 2024-10-04 DIAGNOSIS — Z9181 History of falling: Secondary | ICD-10-CM | POA: Diagnosis not present

## 2024-10-04 DIAGNOSIS — R41841 Cognitive communication deficit: Secondary | ICD-10-CM | POA: Diagnosis not present

## 2024-10-04 DIAGNOSIS — R2681 Unsteadiness on feet: Secondary | ICD-10-CM | POA: Diagnosis not present

## 2024-10-04 DIAGNOSIS — R1312 Dysphagia, oropharyngeal phase: Secondary | ICD-10-CM | POA: Diagnosis not present

## 2024-10-04 DIAGNOSIS — R262 Difficulty in walking, not elsewhere classified: Secondary | ICD-10-CM | POA: Diagnosis not present

## 2024-10-04 DIAGNOSIS — Z7389 Other problems related to life management difficulty: Secondary | ICD-10-CM | POA: Diagnosis not present

## 2024-10-06 DIAGNOSIS — Z9181 History of falling: Secondary | ICD-10-CM | POA: Diagnosis not present

## 2024-10-06 DIAGNOSIS — R262 Difficulty in walking, not elsewhere classified: Secondary | ICD-10-CM | POA: Diagnosis not present

## 2024-10-06 DIAGNOSIS — Z7389 Other problems related to life management difficulty: Secondary | ICD-10-CM | POA: Diagnosis not present

## 2024-10-06 DIAGNOSIS — R41841 Cognitive communication deficit: Secondary | ICD-10-CM | POA: Diagnosis not present

## 2024-10-06 DIAGNOSIS — R1312 Dysphagia, oropharyngeal phase: Secondary | ICD-10-CM | POA: Diagnosis not present

## 2024-10-06 DIAGNOSIS — R2681 Unsteadiness on feet: Secondary | ICD-10-CM | POA: Diagnosis not present

## 2024-10-07 DIAGNOSIS — L89102 Pressure ulcer of unspecified part of back, stage 2: Secondary | ICD-10-CM | POA: Diagnosis not present

## 2024-10-07 DIAGNOSIS — Z9181 History of falling: Secondary | ICD-10-CM | POA: Diagnosis not present

## 2024-10-07 DIAGNOSIS — S42302D Unspecified fracture of shaft of humerus, left arm, subsequent encounter for fracture with routine healing: Secondary | ICD-10-CM | POA: Diagnosis not present

## 2024-10-08 DIAGNOSIS — H353211 Exudative age-related macular degeneration, right eye, with active choroidal neovascularization: Secondary | ICD-10-CM | POA: Diagnosis not present

## 2024-10-15 ENCOUNTER — Encounter (HOSPITAL_BASED_OUTPATIENT_CLINIC_OR_DEPARTMENT_OTHER): Attending: Internal Medicine | Admitting: Internal Medicine

## 2024-10-15 DIAGNOSIS — L89326 Pressure-induced deep tissue damage of left buttock: Secondary | ICD-10-CM | POA: Insufficient documentation

## 2024-10-15 DIAGNOSIS — G4733 Obstructive sleep apnea (adult) (pediatric): Secondary | ICD-10-CM | POA: Insufficient documentation

## 2024-10-15 DIAGNOSIS — X58XXXA Exposure to other specified factors, initial encounter: Secondary | ICD-10-CM | POA: Insufficient documentation

## 2024-10-15 DIAGNOSIS — S20102A Unspecified superficial injuries of breast, left breast, initial encounter: Secondary | ICD-10-CM | POA: Insufficient documentation

## 2024-10-22 DIAGNOSIS — S42202D Unspecified fracture of upper end of left humerus, subsequent encounter for fracture with routine healing: Secondary | ICD-10-CM | POA: Diagnosis not present

## 2025-04-01 ENCOUNTER — Telehealth: Admitting: Adult Health

## 2025-04-04 ENCOUNTER — Ambulatory Visit: Admitting: Adult Health
